# Patient Record
Sex: Male | Born: 1940 | Race: Black or African American | Hispanic: No | Marital: Single | Smoking: Never smoker
Health system: Southern US, Community
[De-identification: ages and names within clinical notes are randomized; demographics above are authoritative.]

## PROBLEM LIST (undated history)

## (undated) DIAGNOSIS — F79 Unspecified intellectual disabilities: Secondary | ICD-10-CM

## (undated) DIAGNOSIS — K219 Gastro-esophageal reflux disease without esophagitis: Secondary | ICD-10-CM

## (undated) DIAGNOSIS — D649 Anemia, unspecified: Secondary | ICD-10-CM

## (undated) DIAGNOSIS — R569 Unspecified convulsions: Secondary | ICD-10-CM

## (undated) DIAGNOSIS — R479 Unspecified speech disturbances: Secondary | ICD-10-CM

## (undated) DIAGNOSIS — I10 Essential (primary) hypertension: Secondary | ICD-10-CM

## (undated) DIAGNOSIS — N179 Acute kidney failure, unspecified: Secondary | ICD-10-CM

## (undated) DIAGNOSIS — F0390 Unspecified dementia without behavioral disturbance: Secondary | ICD-10-CM

## (undated) DIAGNOSIS — G809 Cerebral palsy, unspecified: Secondary | ICD-10-CM

---

## 2005-01-03 ENCOUNTER — Emergency Department (HOSPITAL_COMMUNITY): Admission: EM | Admit: 2005-01-03 | Discharge: 2005-01-03 | Payer: Self-pay | Admitting: Emergency Medicine

## 2005-01-10 ENCOUNTER — Ambulatory Visit (HOSPITAL_COMMUNITY): Admission: RE | Admit: 2005-01-10 | Discharge: 2005-01-10 | Payer: Self-pay | Admitting: Neurology

## 2007-04-05 ENCOUNTER — Ambulatory Visit: Payer: Self-pay | Admitting: Critical Care Medicine

## 2007-04-05 ENCOUNTER — Inpatient Hospital Stay (HOSPITAL_COMMUNITY): Admission: AC | Admit: 2007-04-05 | Discharge: 2007-05-22 | Payer: Self-pay

## 2007-04-05 ENCOUNTER — Ambulatory Visit: Payer: Self-pay | Admitting: *Deleted

## 2007-04-05 ENCOUNTER — Ambulatory Visit: Payer: Self-pay | Admitting: Internal Medicine

## 2007-04-09 ENCOUNTER — Encounter (INDEPENDENT_AMBULATORY_CARE_PROVIDER_SITE_OTHER): Payer: Self-pay | Admitting: *Deleted

## 2007-04-15 ENCOUNTER — Encounter (INDEPENDENT_AMBULATORY_CARE_PROVIDER_SITE_OTHER): Payer: Self-pay | Admitting: Internal Medicine

## 2007-04-27 ENCOUNTER — Ambulatory Visit: Payer: Self-pay | Admitting: Psychiatry

## 2007-05-14 ENCOUNTER — Ambulatory Visit: Payer: Self-pay | Admitting: Psychiatry

## 2007-06-10 ENCOUNTER — Ambulatory Visit: Payer: Self-pay | Admitting: Hospitalist

## 2007-06-10 DIAGNOSIS — F1010 Alcohol abuse, uncomplicated: Secondary | ICD-10-CM | POA: Insufficient documentation

## 2007-06-10 DIAGNOSIS — D649 Anemia, unspecified: Secondary | ICD-10-CM | POA: Insufficient documentation

## 2007-06-10 DIAGNOSIS — J390 Retropharyngeal and parapharyngeal abscess: Secondary | ICD-10-CM | POA: Insufficient documentation

## 2007-06-10 DIAGNOSIS — R569 Unspecified convulsions: Secondary | ICD-10-CM

## 2007-06-10 DIAGNOSIS — D696 Thrombocytopenia, unspecified: Secondary | ICD-10-CM | POA: Insufficient documentation

## 2007-06-10 DIAGNOSIS — F79 Unspecified intellectual disabilities: Secondary | ICD-10-CM

## 2007-06-10 DIAGNOSIS — E119 Type 2 diabetes mellitus without complications: Secondary | ICD-10-CM | POA: Insufficient documentation

## 2007-06-27 ENCOUNTER — Ambulatory Visit (HOSPITAL_COMMUNITY): Admission: RE | Admit: 2007-06-27 | Discharge: 2007-06-27 | Payer: Self-pay | Admitting: Internal Medicine

## 2007-10-19 ENCOUNTER — Encounter: Admission: RE | Admit: 2007-10-19 | Discharge: 2007-10-19 | Payer: Self-pay | Admitting: Neurology

## 2008-01-30 ENCOUNTER — Inpatient Hospital Stay (HOSPITAL_COMMUNITY): Admission: EM | Admit: 2008-01-30 | Discharge: 2008-02-18 | Payer: Self-pay | Admitting: Emergency Medicine

## 2008-01-30 ENCOUNTER — Ambulatory Visit: Payer: Self-pay | Admitting: Internal Medicine

## 2008-02-01 ENCOUNTER — Encounter (INDEPENDENT_AMBULATORY_CARE_PROVIDER_SITE_OTHER): Payer: Self-pay | Admitting: Surgery

## 2008-02-09 ENCOUNTER — Ambulatory Visit: Payer: Self-pay | Admitting: Internal Medicine

## 2008-03-05 ENCOUNTER — Ambulatory Visit (HOSPITAL_COMMUNITY): Admission: RE | Admit: 2008-03-05 | Discharge: 2008-03-05 | Payer: Self-pay | Admitting: Internal Medicine

## 2008-03-09 ENCOUNTER — Emergency Department (HOSPITAL_COMMUNITY): Admission: EM | Admit: 2008-03-09 | Discharge: 2008-03-09 | Payer: Self-pay | Admitting: Emergency Medicine

## 2008-03-31 ENCOUNTER — Emergency Department (HOSPITAL_COMMUNITY): Admission: EM | Admit: 2008-03-31 | Discharge: 2008-03-31 | Payer: Self-pay | Admitting: Emergency Medicine

## 2008-09-01 ENCOUNTER — Emergency Department (HOSPITAL_COMMUNITY): Admission: EM | Admit: 2008-09-01 | Discharge: 2008-09-01 | Payer: Self-pay | Admitting: Emergency Medicine

## 2008-09-24 ENCOUNTER — Emergency Department (HOSPITAL_BASED_OUTPATIENT_CLINIC_OR_DEPARTMENT_OTHER): Admission: EM | Admit: 2008-09-24 | Discharge: 2008-09-24 | Payer: Self-pay | Admitting: Emergency Medicine

## 2008-09-25 ENCOUNTER — Inpatient Hospital Stay (HOSPITAL_COMMUNITY): Admission: EM | Admit: 2008-09-25 | Discharge: 2008-10-09 | Payer: Self-pay | Admitting: Emergency Medicine

## 2009-08-28 IMAGING — CR DG ABD PORTABLE 1V
1 series · 1 of 1 positions shown · non-contrast
Comparison: None for comparison.

CLINICAL DATA: Femoral line placement.
 PORTABLE ABDOMEN - 1 VIEW:

[view not recorded]
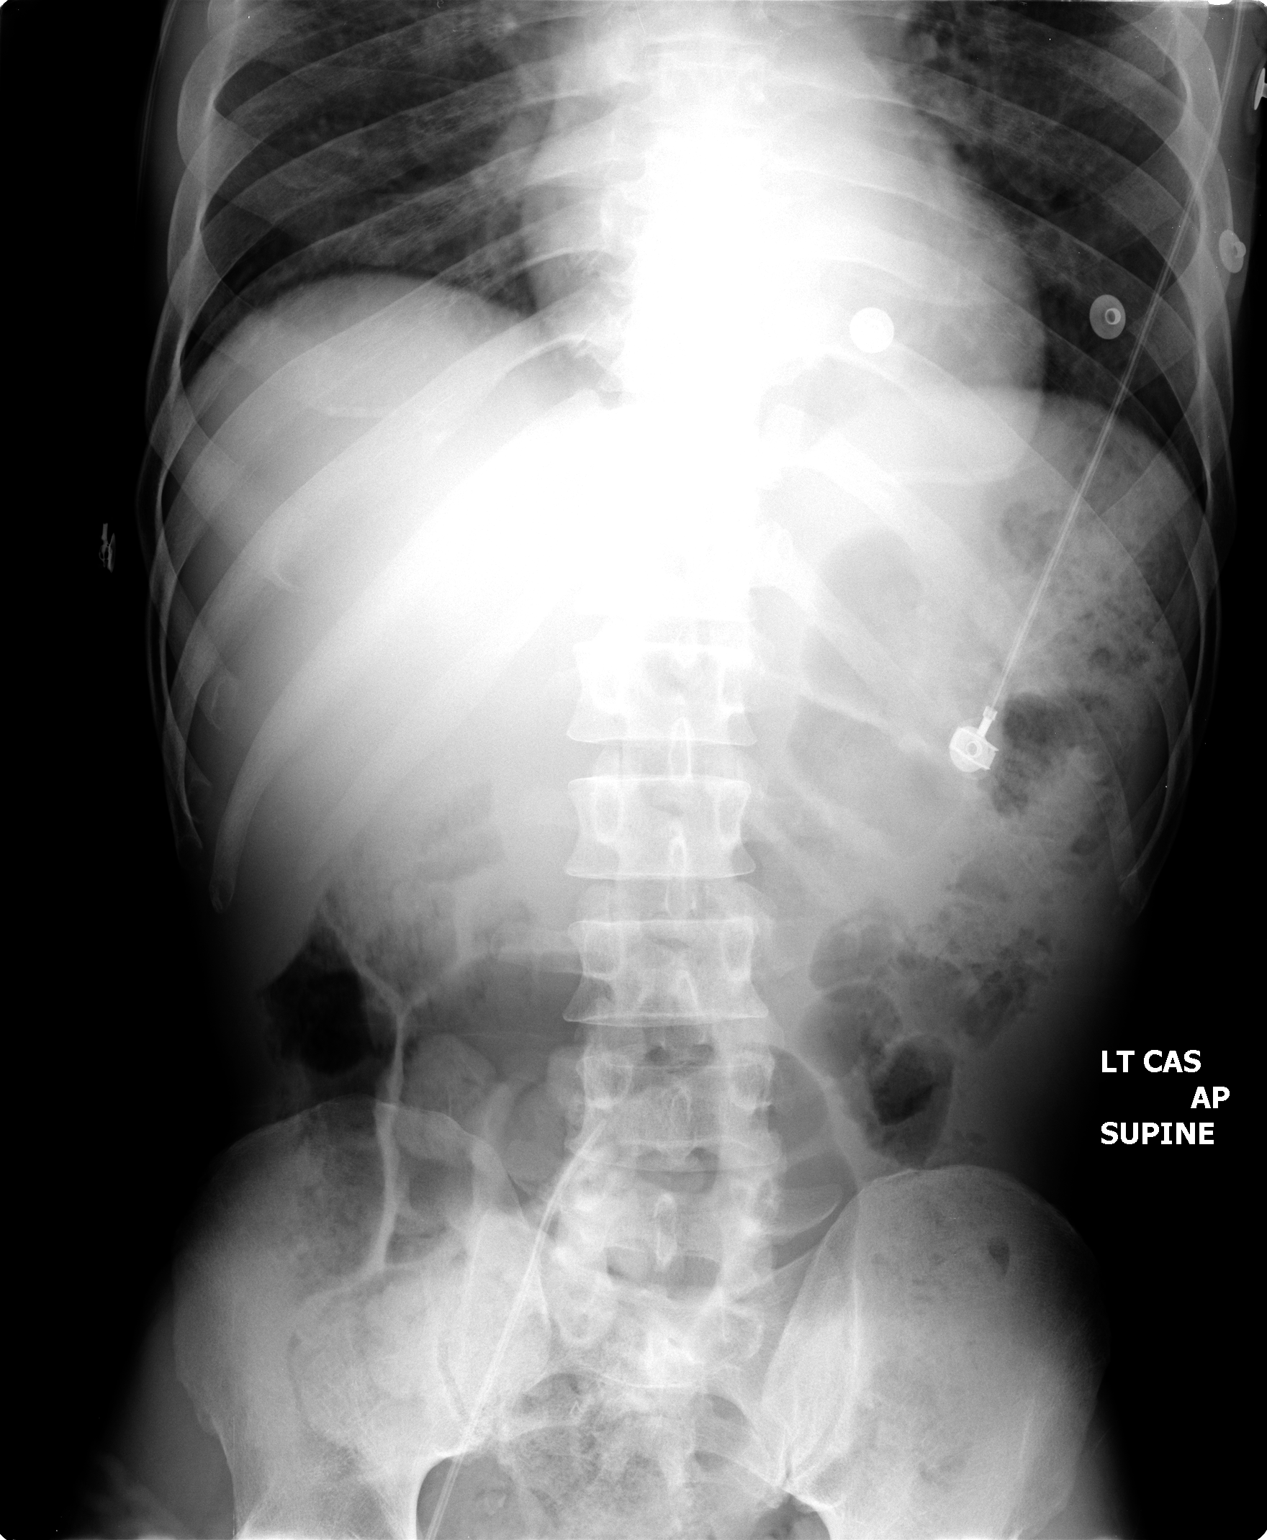

[1 of 1 positions shown; findings below may reference images not displayed]

FINDINGS: A right-sided catheter overlies the pelvis, with there distal tip projecting over the L4 vertebral body, likely near the junction of the right common iliac vein and inferior vena cava.  The tip of the catheter is to the right of midline.  
 There is a nonobstructive bowel gas pattern.
IMPRESSION: Right femoral catheter tip is to the right of midline at the level of L4.

## 2009-08-28 IMAGING — CT CT HEAD W/O CM
1 of 3 series · 15 of 30 positions shown, 19 images · IV contrast (agent unspecified)
Comparison: None.

CLINICAL DATA: Trauma. Fall. 
 HEAD CT WITHOUT CONTRAST:
TECHNIQUE: Contiguous axial images were obtained from the base of the skull through the vertex according to standard protocol without contrast.

[Series 4: head trauma 2.4 h60s · axial · 0.49mm/px · z∈[+1148,+1296]mm · 15 of 66 slices shown, 19 images]
[im 4/66  brain]
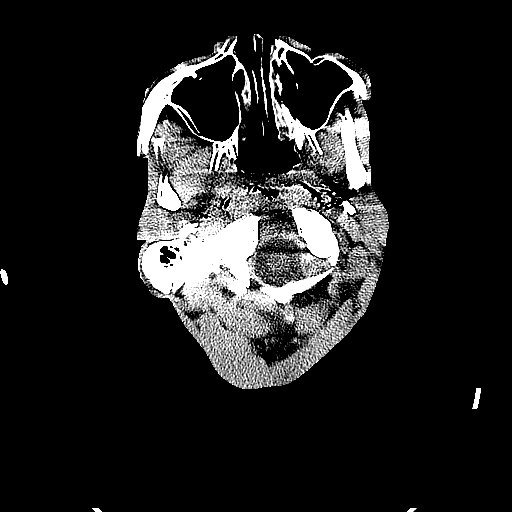
[im 4/66  bone]
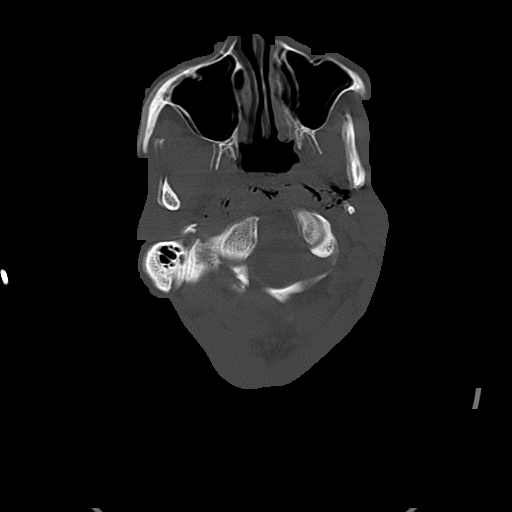
[im 8/66  brain]
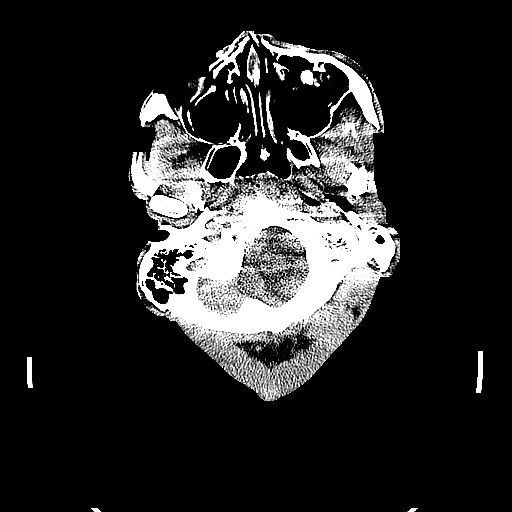
[im 12/66  brain]
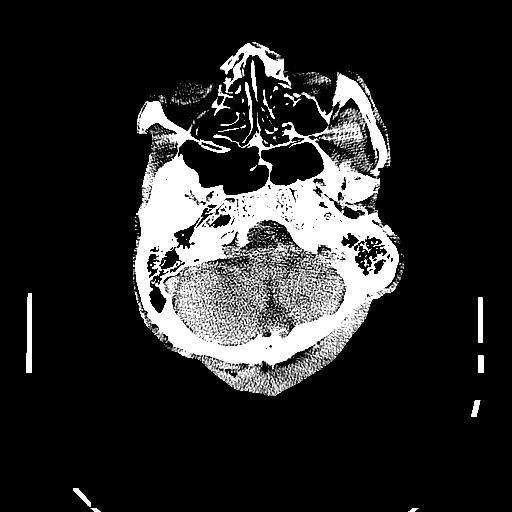
[im 16/66  brain]
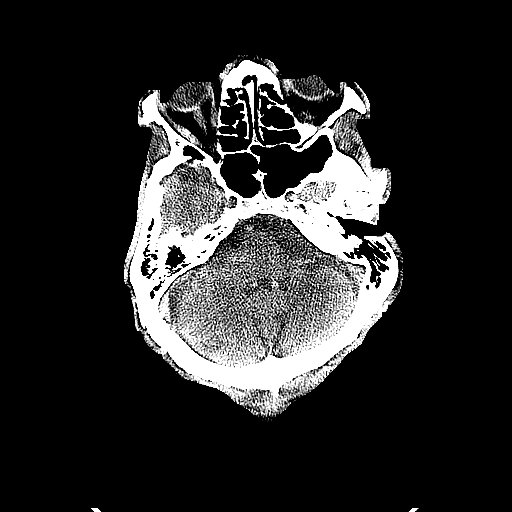
[im 20/66  brain]
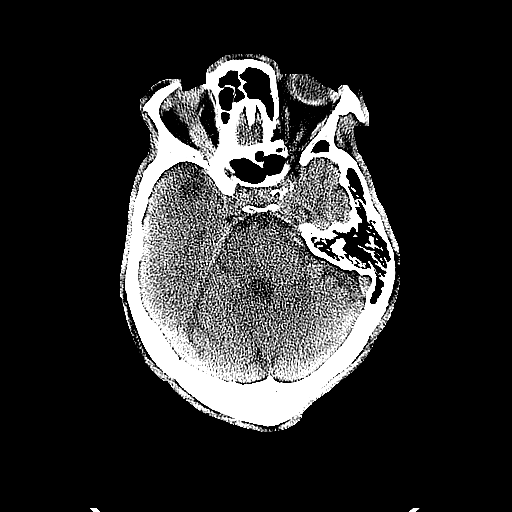
[im 20/66  bone]
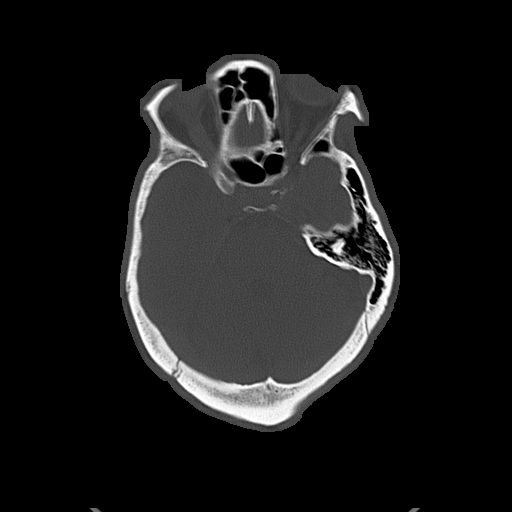
[im 23/66  brain]
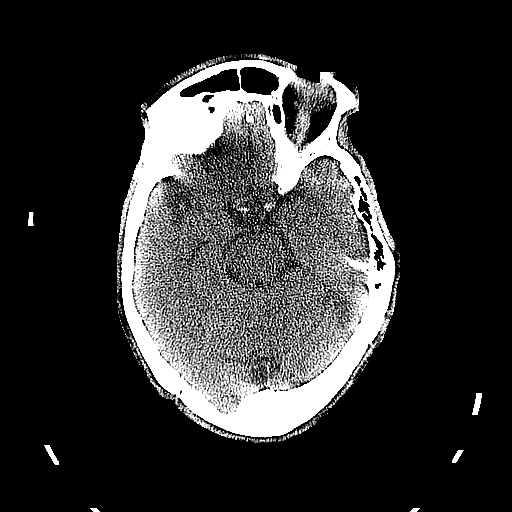
[im 27/66  brain]
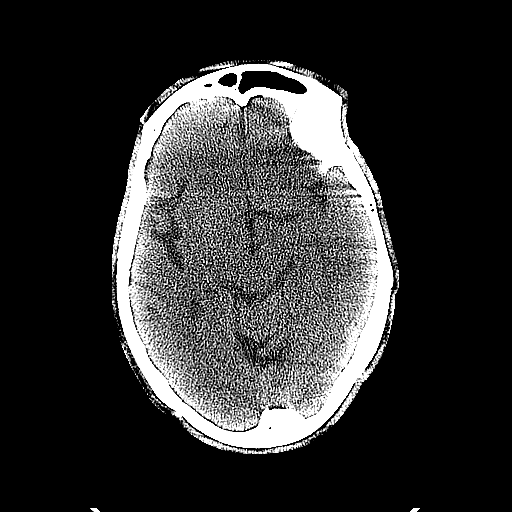
[im 35/66  brain]
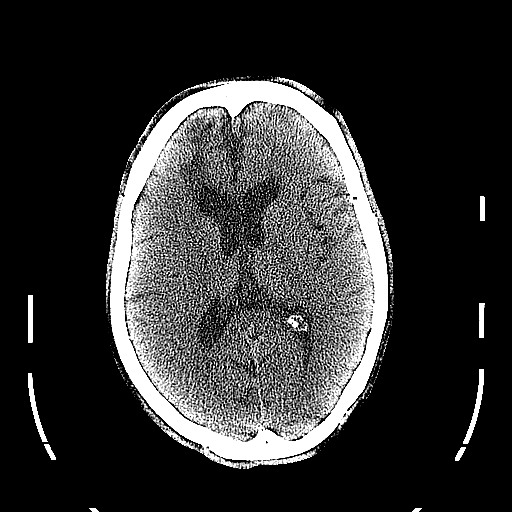
[im 39/66  brain]
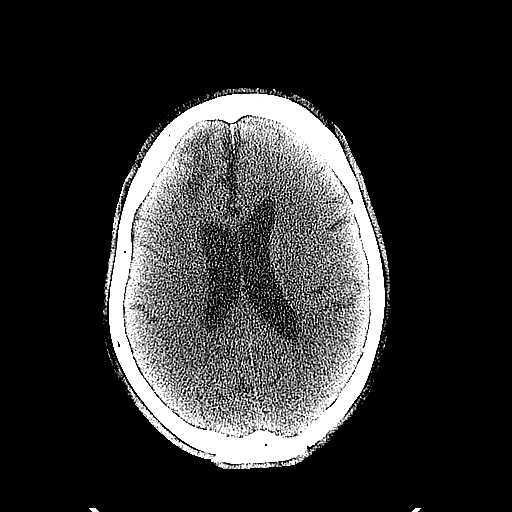
[im 39/66  bone]
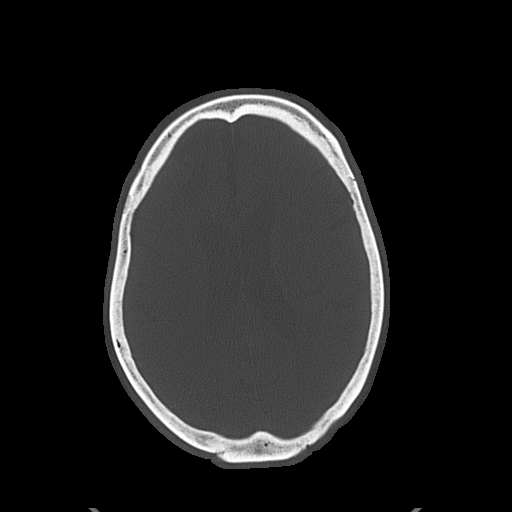
[im 43/66  brain]
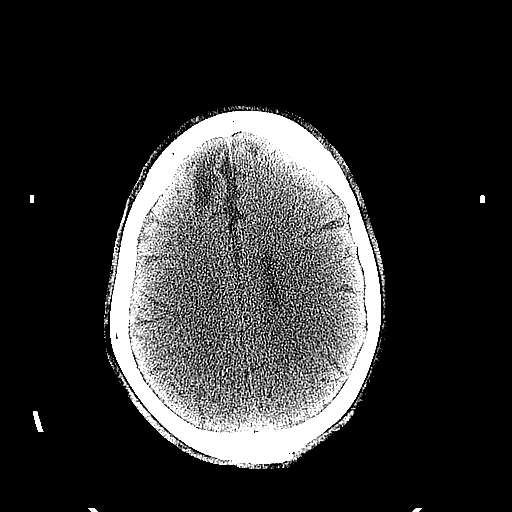
[im 46/66  brain]
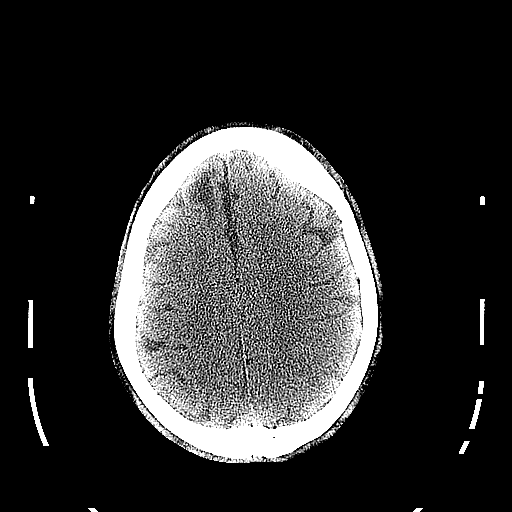
[im 50/66  brain]
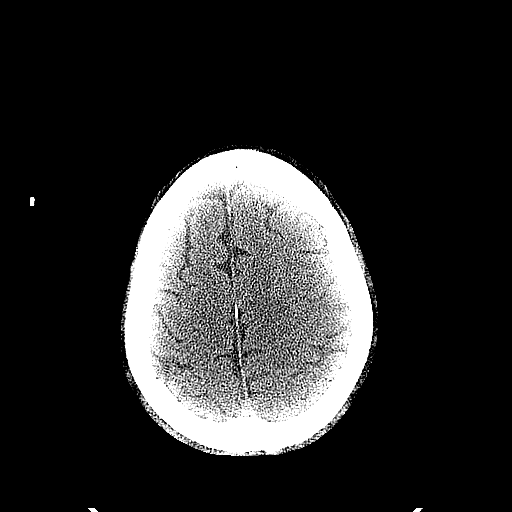
[im 54/66  brain]
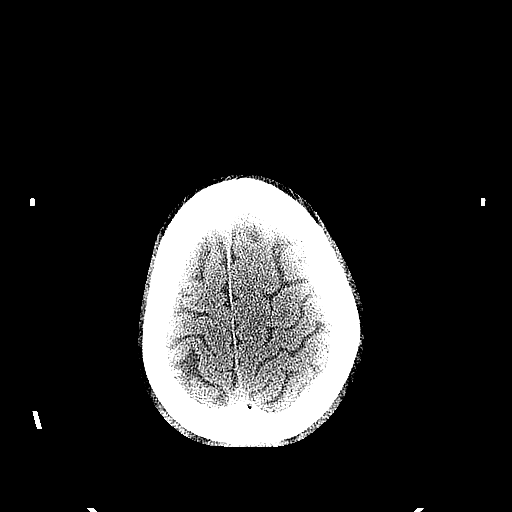
[im 54/66  bone]
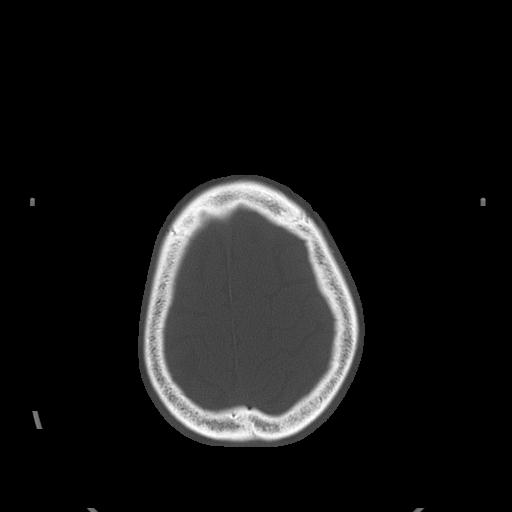
[im 58/66  brain]
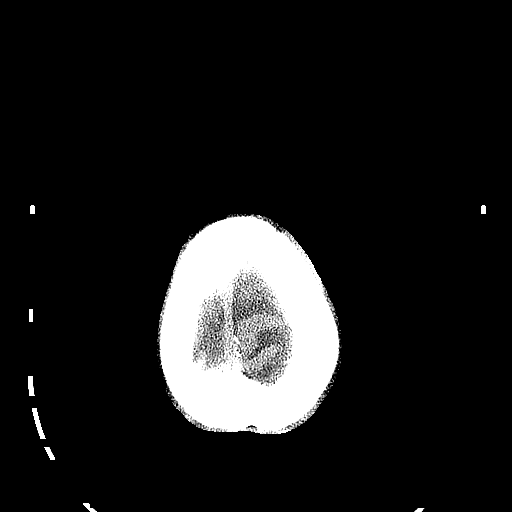
[im 62/66  brain]
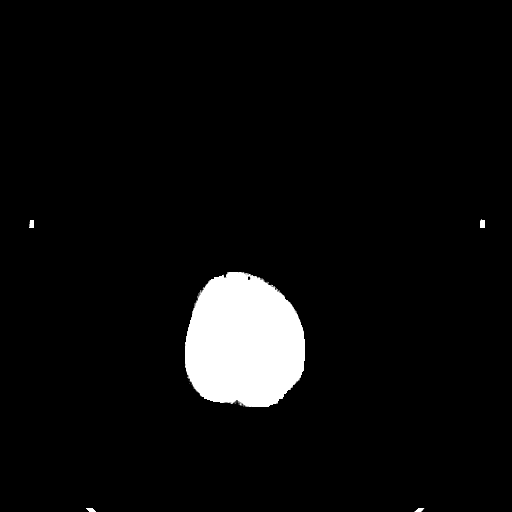

[15 of 30 positions shown; findings below may reference images not displayed]

FINDINGS: There is a chronic infarct in the right anterior cerebral artery territory.  There is a probable mass in the left frontal lobe.  This measures 2 cm and is slightly hyperdense.  It abuts the left frontal bone and may be a meningioma.  This could be better evaluated with MRI or CT with contrast.  No significant edema or mass effect is noted.  There is no midline shift. There is no hemorrhage.  No acute infarct is identified. MRI would be more sensitive to detect acute ischemia. 
 There is gas in the retropharyngeal soft tissues.  This may be due to mucosal laceration, which is not identified.  Barotrauma or attempt at tube placement could also cause this appearance.  No fracture is identified.   There is a moderately large scalp hematoma in the occipital region.
IMPRESSION: 1.  Chronic right anterior cerebral artery infarct. No acute infarct. 
 2.  There is a probable 2 cm mass in the left frontal lobe, which may represent a meningioma. Followup MRI with contrast or CT with contrast is suggested.
 3.  There is gas in the retropharyngeal tissues, which may be due to trauma.

## 2009-08-29 IMAGING — CR DG CHEST 1V PORT
1 series · 1 of 1 positions shown · non-contrast
Comparison: Earlier today.

CLINICAL DATA: Post-intubation.  Fever.  Altered mental status.  
 PORTABLE CHEST ([DATE] HOURS):

[AP]
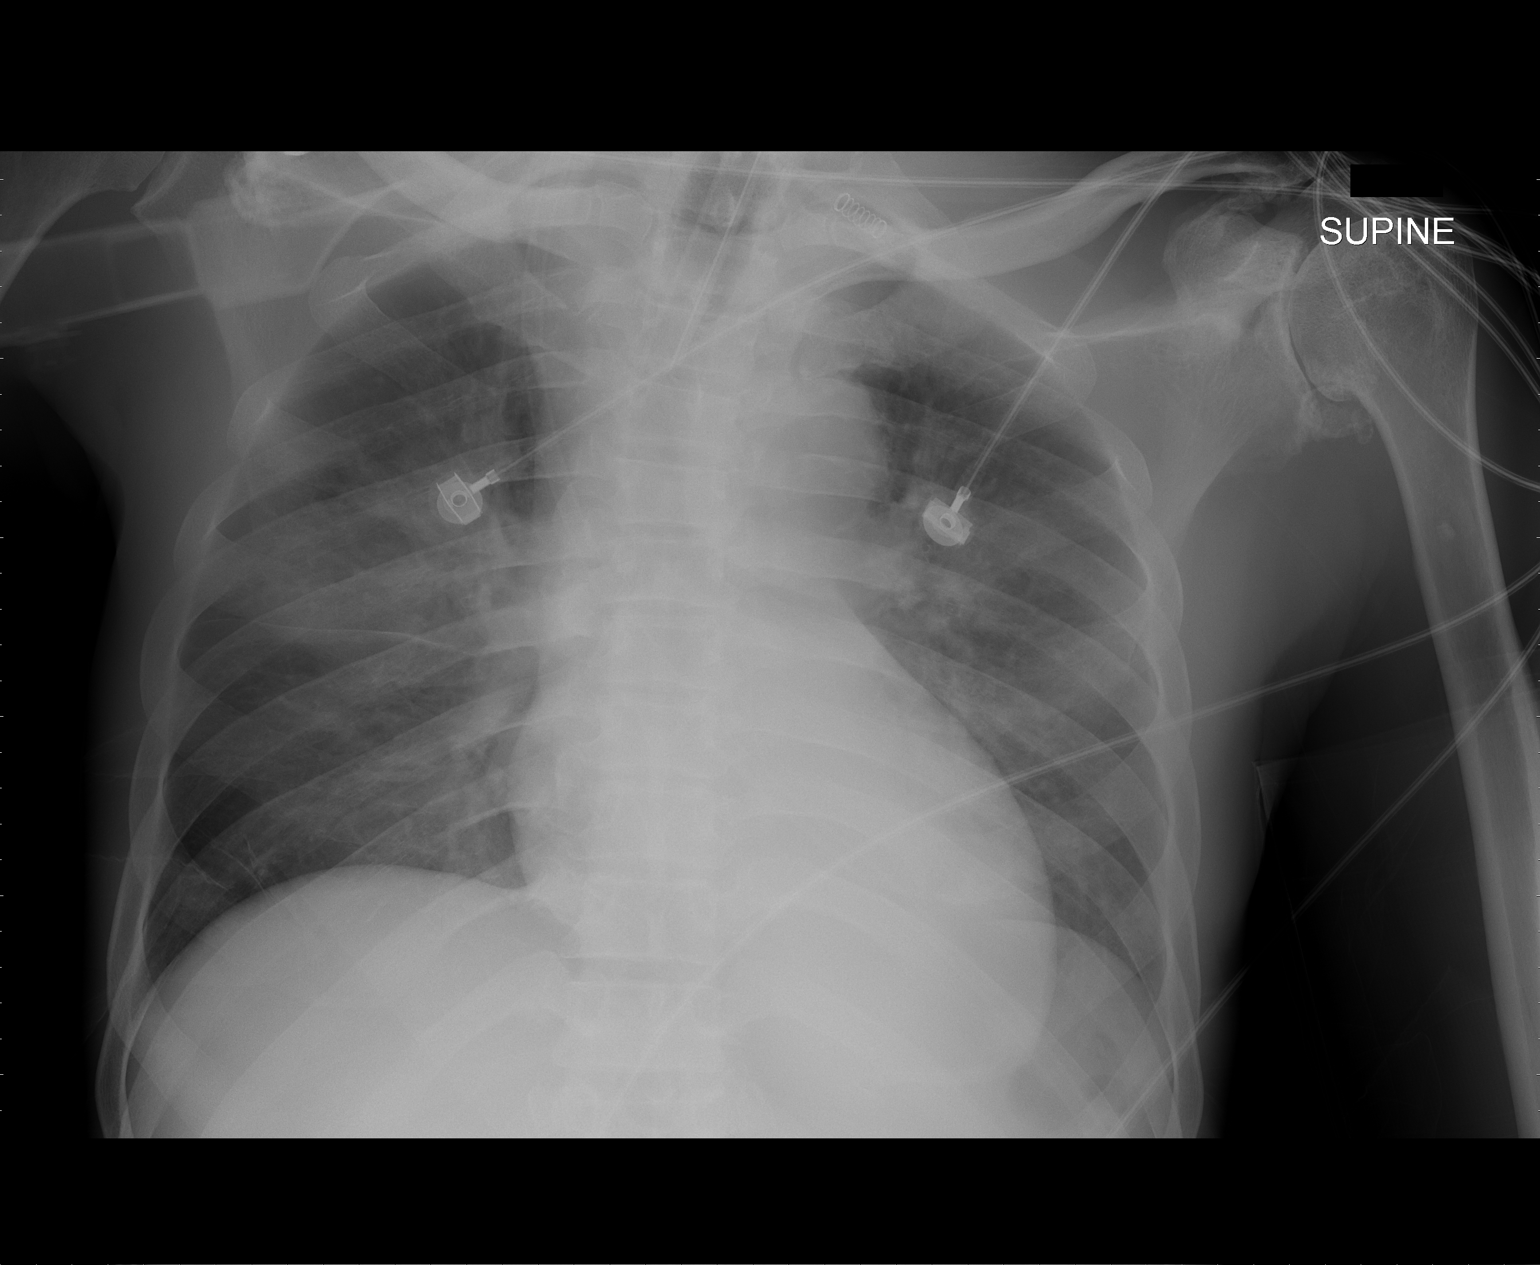

[1 of 1 positions shown; findings below may reference images not displayed]

FINDINGS: Endotracheal tube remains in good position 4.9 cm above the carina.  There is little change in primarily perihilar airspace disease right greater than left.  Heart size is stable.
IMPRESSION: Endotracheal tube tip 4.9 cm above carina.  Persistent perihilar airspace disease.

## 2009-09-08 IMAGING — CR DG CHEST 1V PORT
1 series · 1 of 1 positions shown · non-contrast
Comparison: none

CLINICAL DATA: 66-year-old male, fever.  Altered mental status.  Trach placement.    
 PORTABLE CHEST - 1 VIEW:

[AP]
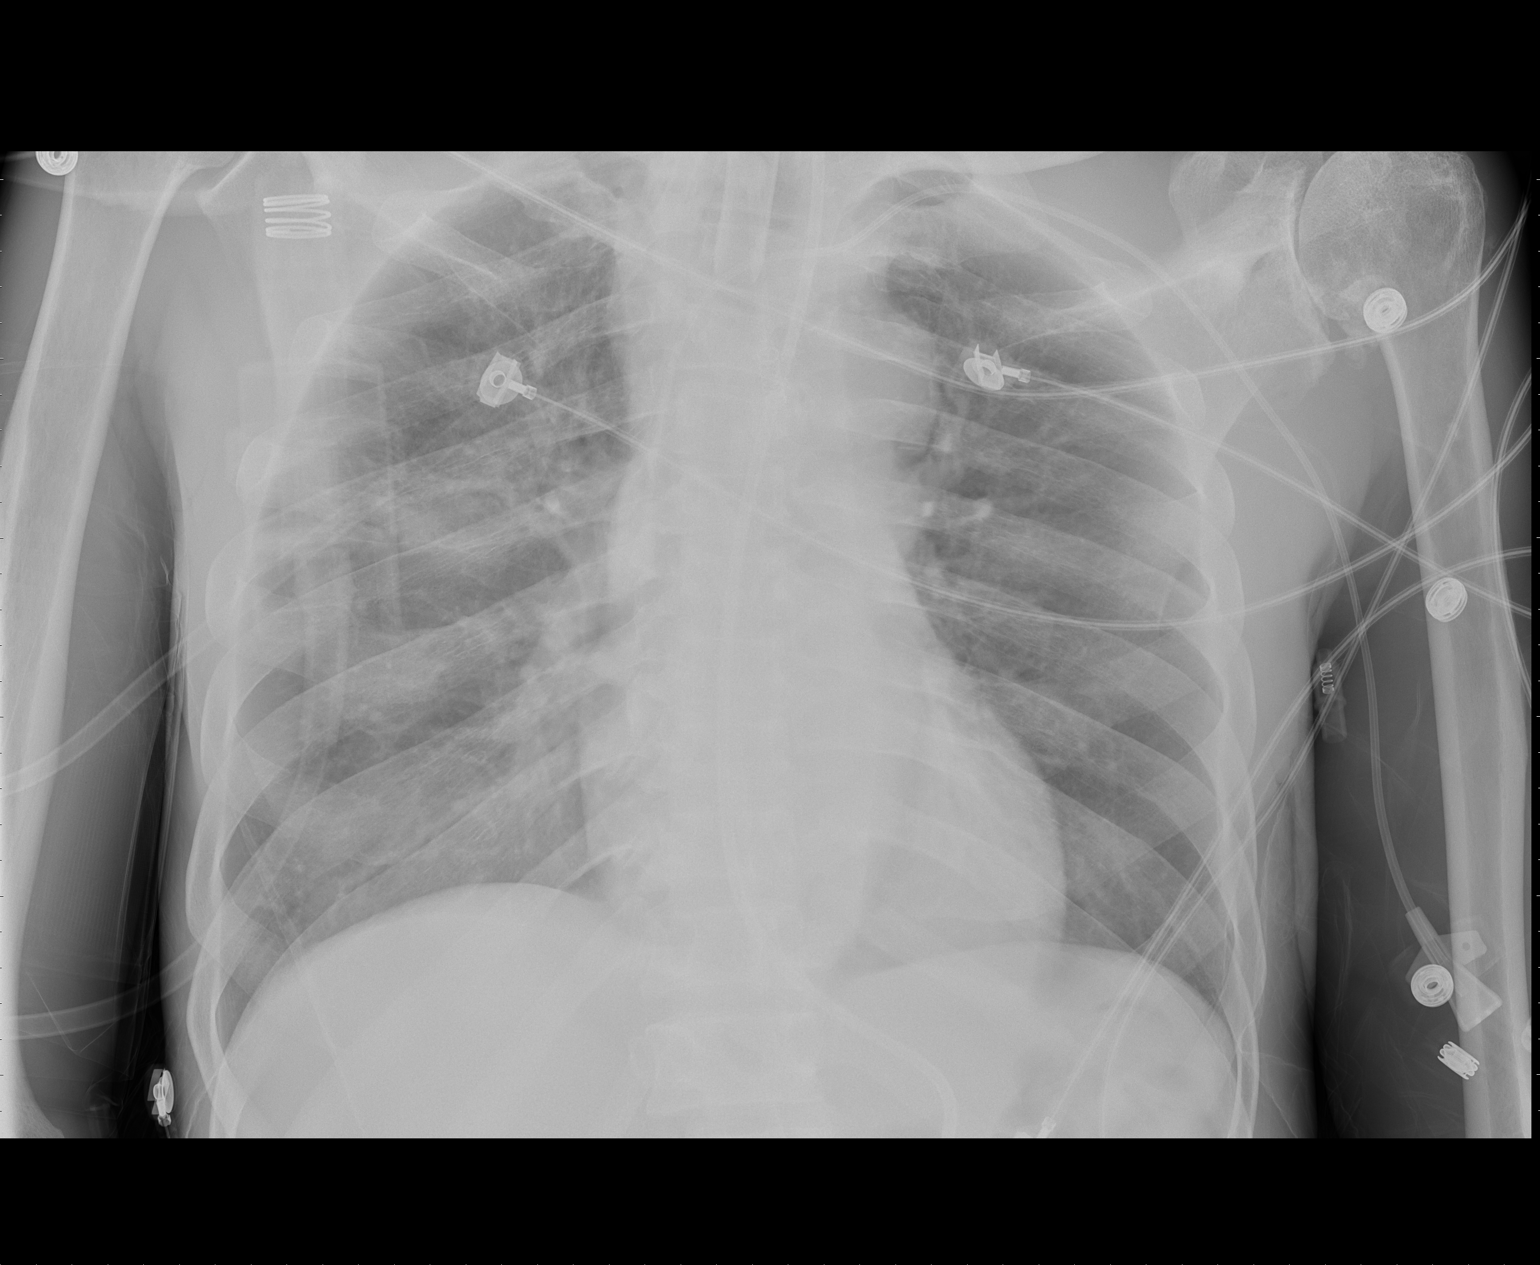

[1 of 1 positions shown; findings below may reference images not displayed]

FINDINGS: Tracheostomy tube is in place.  The tip terminates 6 cm above the carina.  Left-sided PICC line and small-bore feeding tube are in place.  The cardiopericardial silhouette is within normal limits for size.  There is no significant change in mild edema.  There is slight increase in airspace disease involving the right upper lobe.  Early infection is not excluded.
IMPRESSION: 1.   Interval placement of tracheostomy tube. 
 2.  Stable mild edema.  
 3.  Slight increase in right upper lobe airspace disease.  Question developing infection.

## 2009-09-15 IMAGING — CR DG CHEST 1V PORT
1 series · 1 of 1 positions shown · non-contrast
Comparison: none

HISTORY: Fever, altered mental status, fall

[AP]
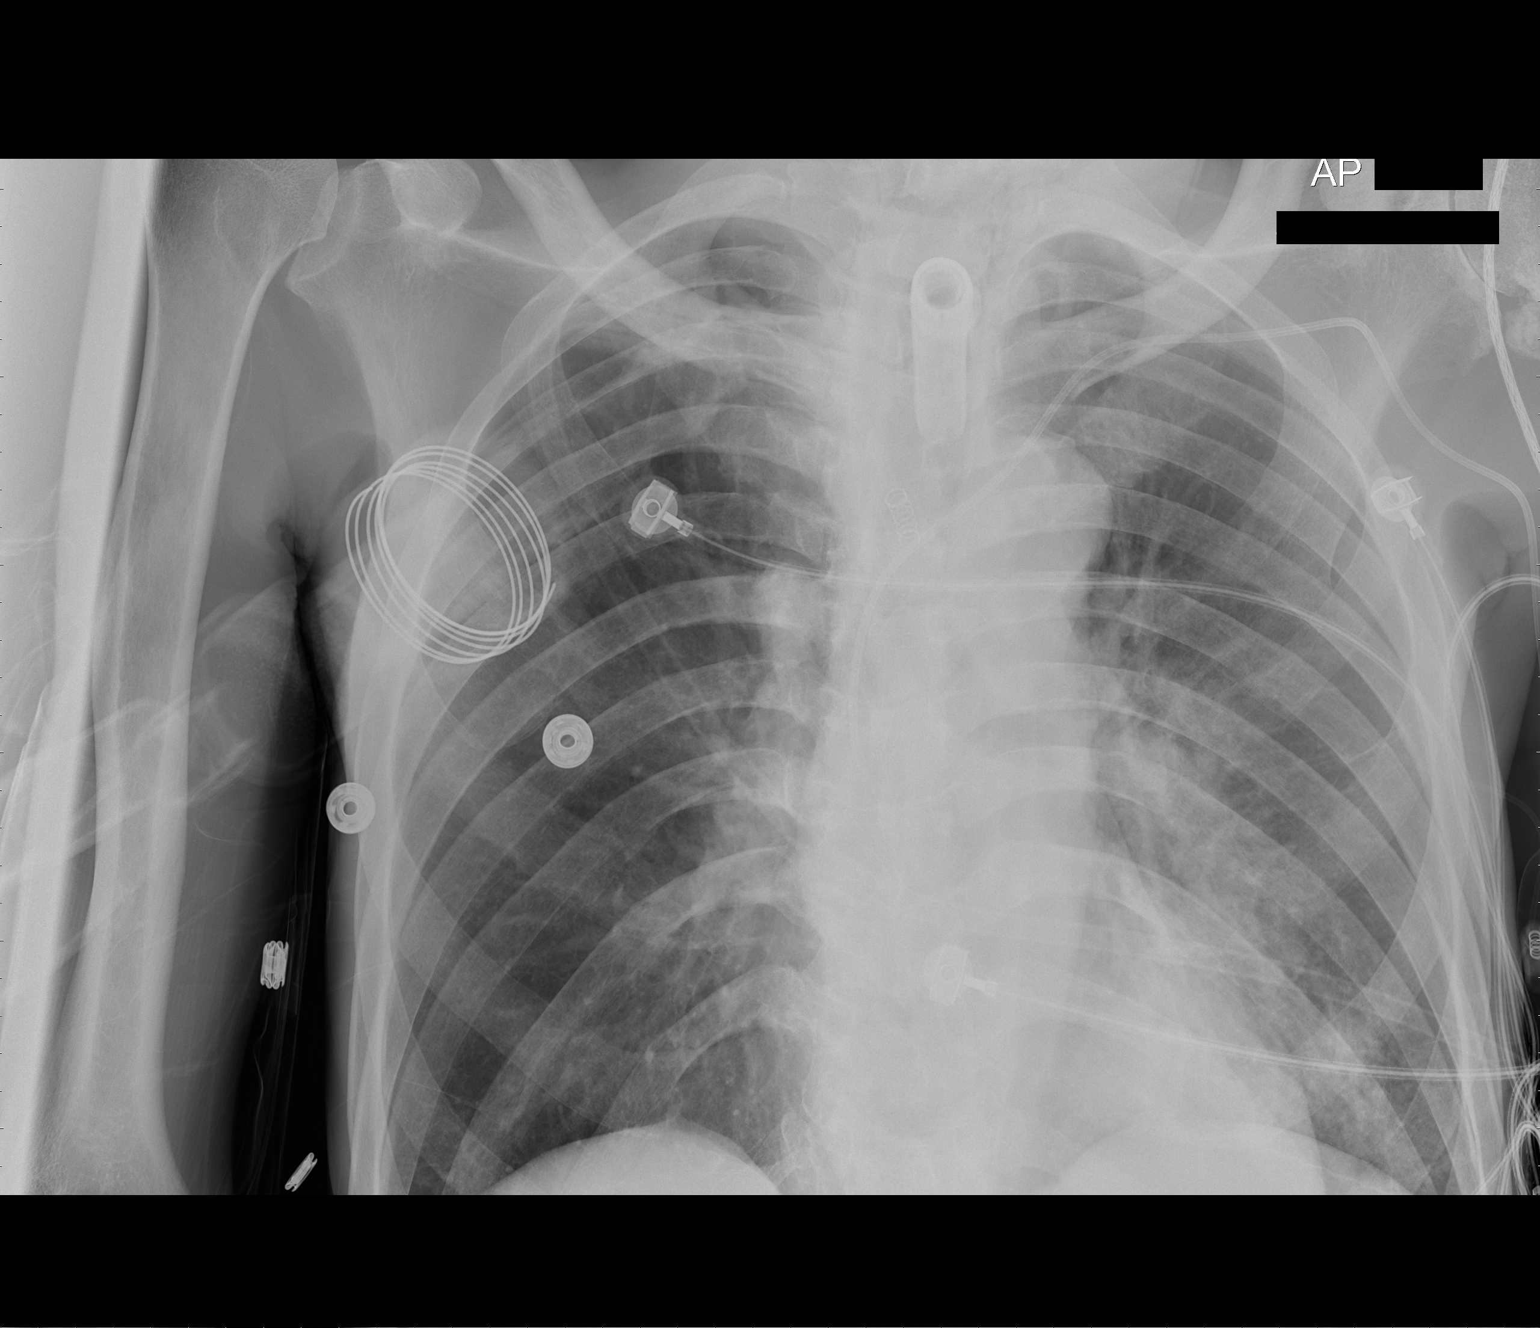

[1 of 1 positions shown; findings below may reference images not displayed]

PORTABLE CHEST ONE VIEW:

Portable exam 9032 hours compared to prior study of 04/20/2007

Tracheostomy tube and left arm PICC line stable.
Normal heart size, mediastinal contours, pulmonary vascularity.
Improved aeration right chest due to combination of improved infiltrate and
decreased effusion.
Persistent left lower lobe infiltrate.
Remaining lungs clear.
IMPRESSION: Persistent left lower lobe infiltrate.

## 2010-03-13 ENCOUNTER — Encounter: Payer: Self-pay | Admitting: *Deleted

## 2010-05-28 LAB — CBC
HCT: 28.1 % — ABNORMAL LOW (ref 39.0–52.0)
HCT: 28.5 % — ABNORMAL LOW (ref 39.0–52.0)
HCT: 29 % — ABNORMAL LOW (ref 39.0–52.0)
HCT: 34.4 % — ABNORMAL LOW (ref 39.0–52.0)
HCT: 39.9 % (ref 39.0–52.0)
Hemoglobin: 10 g/dL — ABNORMAL LOW (ref 13.0–17.0)
Hemoglobin: 10 g/dL — ABNORMAL LOW (ref 13.0–17.0)
Hemoglobin: 10.6 g/dL — ABNORMAL LOW (ref 13.0–17.0)
Hemoglobin: 11.7 g/dL — ABNORMAL LOW (ref 13.0–17.0)
Hemoglobin: 12.6 g/dL — ABNORMAL LOW (ref 13.0–17.0)
Hemoglobin: 13.6 g/dL (ref 13.0–17.0)
Hemoglobin: 13.8 g/dL (ref 13.0–17.0)
Hemoglobin: 9.7 g/dL — ABNORMAL LOW (ref 13.0–17.0)
MCHC: 33.6 g/dL (ref 30.0–36.0)
MCHC: 33.8 g/dL (ref 30.0–36.0)
MCHC: 33.7 g/dL (ref 30.0–36.0)
MCHC: 33.8 g/dL (ref 30.0–36.0)
MCHC: 33.9 g/dL (ref 30.0–36.0)
MCV: 94.8 fL (ref 78.0–100.0)
MCV: 92.8 fL (ref 78.0–100.0)
MCV: 93.6 fL (ref 78.0–100.0)
MCV: 94.1 fL (ref 78.0–100.0)
Platelets: 402 10*3/uL — ABNORMAL HIGH (ref 150–400)
Platelets: 476 10*3/uL — ABNORMAL HIGH (ref 150–400)
Platelets: 222 10*3/uL (ref 150–400)
RBC: 3.16 MIL/uL — ABNORMAL LOW (ref 4.22–5.81)
RBC: 3.13 MIL/uL — ABNORMAL LOW (ref 4.22–5.81)
RBC: 3.33 MIL/uL — ABNORMAL LOW (ref 4.22–5.81)
RBC: 3.94 MIL/uL — ABNORMAL LOW (ref 4.22–5.81)
RBC: 4.27 MIL/uL (ref 4.22–5.81)
RBC: 4.32 MIL/uL (ref 4.22–5.81)
RDW: 13.3 % (ref 11.5–15.5)
RDW: 12.3 % (ref 11.5–15.5)
RDW: 12.4 % (ref 11.5–15.5)
WBC: 16.7 10*3/uL — ABNORMAL HIGH (ref 4.0–10.5)
WBC: 12.2 10*3/uL — ABNORMAL HIGH (ref 4.0–10.5)
WBC: 13.8 10*3/uL — ABNORMAL HIGH (ref 4.0–10.5)
WBC: 17.4 10*3/uL — ABNORMAL HIGH (ref 4.0–10.5)
WBC: 20.4 10*3/uL — ABNORMAL HIGH (ref 4.0–10.5)
WBC: 23.7 10*3/uL — ABNORMAL HIGH (ref 4.0–10.5)

## 2010-05-28 LAB — BASIC METABOLIC PANEL
BUN: 2 mg/dL — ABNORMAL LOW (ref 6–23)
CO2: 27 mEq/L (ref 19–32)
CO2: 27 mEq/L (ref 19–32)
CO2: 28 mEq/L (ref 19–32)
CO2: 28 mEq/L (ref 19–32)
Calcium: 8 mg/dL — ABNORMAL LOW (ref 8.4–10.5)
Calcium: 8.6 mg/dL (ref 8.4–10.5)
Calcium: 9.2 mg/dL (ref 8.4–10.5)
Chloride: 103 mEq/L (ref 96–112)
Chloride: 105 mEq/L (ref 96–112)
Chloride: 106 mEq/L (ref 96–112)
Creatinine, Ser: 0.76 mg/dL (ref 0.4–1.5)
Creatinine, Ser: 0.8 mg/dL (ref 0.4–1.5)
Creatinine, Ser: 0.93 mg/dL (ref 0.4–1.5)
GFR calc Af Amer: >60 mL/min (ref >60)
GFR calc Af Amer: >60 mL/min (ref >60)
GFR calc Af Amer: >60 mL/min (ref >60)
GFR calc Af Amer: >60 mL/min (ref >60)
GFR calc Af Amer: >60 mL/min (ref >60)
GFR calc non Af Amer: >60 mL/min (ref >60)
GFR calc non Af Amer: >60 mL/min (ref >60)
Glucose, Bld: 89 mg/dL (ref 70–99)
Glucose, Bld: 195 mg/dL — ABNORMAL HIGH (ref 70–99)
Potassium: 3.5 mEq/L (ref 3.5–5.1)
Sodium: 141 mEq/L (ref 135–145)
Sodium: 139 mEq/L (ref 135–145)
Sodium: 139 mEq/L (ref 135–145)

## 2010-05-28 LAB — GLUCOSE, CAPILLARY
Glucose-Capillary: 100 mg/dL — ABNORMAL HIGH (ref 70–99)
Glucose-Capillary: 101 mg/dL — ABNORMAL HIGH (ref 70–99)
Glucose-Capillary: 106 mg/dL — ABNORMAL HIGH (ref 70–99)
Glucose-Capillary: 109 mg/dL — ABNORMAL HIGH (ref 70–99)
Glucose-Capillary: 115 mg/dL — ABNORMAL HIGH (ref 70–99)
Glucose-Capillary: 122 mg/dL — ABNORMAL HIGH (ref 70–99)
Glucose-Capillary: 106 mg/dL — ABNORMAL HIGH (ref 70–99)
Glucose-Capillary: 117 mg/dL — ABNORMAL HIGH (ref 70–99)
Glucose-Capillary: 118 mg/dL — ABNORMAL HIGH (ref 70–99)
Glucose-Capillary: 122 mg/dL — ABNORMAL HIGH (ref 70–99)
Glucose-Capillary: 127 mg/dL — ABNORMAL HIGH (ref 70–99)
Glucose-Capillary: 128 mg/dL — ABNORMAL HIGH (ref 70–99)
Glucose-Capillary: 135 mg/dL — ABNORMAL HIGH (ref 70–99)
Glucose-Capillary: 138 mg/dL — ABNORMAL HIGH (ref 70–99)
Glucose-Capillary: 138 mg/dL — ABNORMAL HIGH (ref 70–99)
Glucose-Capillary: 139 mg/dL — ABNORMAL HIGH (ref 70–99)
Glucose-Capillary: 140 mg/dL — ABNORMAL HIGH (ref 70–99)
Glucose-Capillary: 140 mg/dL — ABNORMAL HIGH (ref 70–99)
Glucose-Capillary: 145 mg/dL — ABNORMAL HIGH (ref 70–99)
Glucose-Capillary: 148 mg/dL — ABNORMAL HIGH (ref 70–99)
Glucose-Capillary: 173 mg/dL — ABNORMAL HIGH (ref 70–99)
Glucose-Capillary: 218 mg/dL — ABNORMAL HIGH (ref 70–99)

## 2010-05-28 LAB — DIFFERENTIAL
Basophils Absolute: 0.1 10*3/uL (ref 0.0–0.1)
Basophils Relative: 0 % (ref 0–1)
Basophils Relative: 0 % (ref 0–1)
Basophils Relative: 0 % (ref 0–1)
Eosinophils Absolute: 0 10*3/uL (ref 0.0–0.7)
Eosinophils Relative: 0 % (ref 0–5)
Lymphocytes Relative: 5 % — ABNORMAL LOW (ref 12–46)
Lymphs Abs: 0.4 10*3/uL — ABNORMAL LOW (ref 0.7–4.0)
Lymphs Abs: 1 10*3/uL (ref 0.7–4.0)
Monocytes Absolute: 0.5 10*3/uL (ref 0.1–1.0)
Monocytes Absolute: 1.7 10*3/uL — ABNORMAL HIGH (ref 0.1–1.0)
Monocytes Relative: 4 % (ref 3–12)
Monocytes Relative: 8 % (ref 3–12)
Neutro Abs: 11.3 10*3/uL — ABNORMAL HIGH (ref 1.7–7.7)
Neutro Abs: 17.6 10*3/uL — ABNORMAL HIGH (ref 1.7–7.7)
Neutrophils Relative %: 87 % — ABNORMAL HIGH (ref 43–77)

## 2010-05-28 LAB — URINALYSIS, MICROSCOPIC ONLY
Bilirubin Urine: NEGATIVE
Glucose, UA: NEGATIVE mg/dL
Nitrite: NEGATIVE
Protein, ur: 30 mg/dL — AB
Specific Gravity, Urine: 1.019 (ref 1.005–1.030)
Urobilinogen, UA: 0.2 mg/dL (ref 0.0–1.0)
Urobilinogen, UA: 2 mg/dL — ABNORMAL HIGH (ref 0.0–1.0)

## 2010-05-28 LAB — RENAL FUNCTION PANEL
BUN: 7 mg/dL (ref 6–23)
CO2: 26 mEq/L (ref 19–32)
Chloride: 112 mEq/L (ref 96–112)
Creatinine, Ser: 0.88 mg/dL (ref 0.4–1.5)
Glucose, Bld: 142 mg/dL — ABNORMAL HIGH (ref 70–99)

## 2010-05-28 LAB — COMPREHENSIVE METABOLIC PANEL
ALT: 12 U/L (ref 0–53)
ALT: 13 U/L (ref 0–53)
AST: 15 U/L (ref 0–37)
AST: 17 U/L (ref 0–37)
Albumin: 2.2 g/dL — ABNORMAL LOW (ref 3.5–5.2)
Alkaline Phosphatase: 55 U/L (ref 39–117)
Alkaline Phosphatase: 64 U/L (ref 39–117)
Alkaline Phosphatase: 66 U/L (ref 39–117)
Alkaline Phosphatase: 69 U/L (ref 39–117)
BUN: 5 mg/dL — ABNORMAL LOW (ref 6–23)
BUN: 7 mg/dL (ref 6–23)
CO2: 25 mEq/L (ref 19–32)
CO2: 26 mEq/L (ref 19–32)
CO2: 26 mEq/L (ref 19–32)
Calcium: 8.1 mg/dL — ABNORMAL LOW (ref 8.4–10.5)
Calcium: 8.8 mg/dL (ref 8.4–10.5)
Chloride: 105 mEq/L (ref 96–112)
Chloride: 111 mEq/L (ref 96–112)
Creatinine, Ser: 0.75 mg/dL (ref 0.4–1.5)
GFR calc Af Amer: >60 mL/min (ref >60)
GFR calc Af Amer: >60 mL/min (ref >60)
GFR calc non Af Amer: >60 mL/min (ref >60)
GFR calc non Af Amer: >60 mL/min (ref >60)
Glucose, Bld: 120 mg/dL — ABNORMAL HIGH (ref 70–99)
Glucose, Bld: 126 mg/dL — ABNORMAL HIGH (ref 70–99)
Glucose, Bld: 126 mg/dL — ABNORMAL HIGH (ref 70–99)
Potassium: 3.3 mEq/L — ABNORMAL LOW (ref 3.5–5.1)
Potassium: 3.3 mEq/L — ABNORMAL LOW (ref 3.5–5.1)
Potassium: 3.4 mEq/L — ABNORMAL LOW (ref 3.5–5.1)
Potassium: 3.6 mEq/L (ref 3.5–5.1)
Sodium: 140 mEq/L (ref 135–145)
Sodium: 144 mEq/L (ref 135–145)
Total Bilirubin: 0.5 mg/dL (ref 0.3–1.2)
Total Bilirubin: 1 mg/dL (ref 0.3–1.2)
Total Protein: 5.8 g/dL — ABNORMAL LOW (ref 6.0–8.3)
Total Protein: 6.5 g/dL (ref 6.0–8.3)

## 2010-05-28 LAB — URINE CULTURE
Colony Count: NO GROWTH
Colony Count: NO GROWTH
Culture: NO GROWTH
Culture: NO GROWTH

## 2010-05-28 LAB — CULTURE, BLOOD (ROUTINE X 2): Culture: NO GROWTH

## 2010-05-28 LAB — URINALYSIS, ROUTINE W REFLEX MICROSCOPIC
Glucose, UA: NEGATIVE mg/dL
Ketones, ur: NEGATIVE mg/dL
Leukocytes, UA: NEGATIVE
Nitrite: NEGATIVE
Protein, ur: 100 mg/dL — AB
Urobilinogen, UA: 1 mg/dL (ref 0.0–1.0)

## 2010-05-28 LAB — CLOSTRIDIUM DIFFICILE EIA

## 2010-05-28 LAB — PHENYTOIN LEVEL, TOTAL
Phenytoin Lvl: 2.7 ug/mL — ABNORMAL LOW (ref 10.0–20.0)
Phenytoin Lvl: 15.2 ug/mL (ref 10.0–20.0)
Phenytoin Lvl: 19.6 ug/mL (ref 10.0–20.0)

## 2010-05-28 LAB — URINE MICROSCOPIC-ADD ON

## 2010-05-28 LAB — T4, FREE: Free T4: 0.75 ng/dL — ABNORMAL LOW (ref 0.80–1.80)

## 2010-05-28 LAB — BRAIN NATRIURETIC PEPTIDE
Pro B Natriuretic peptide (BNP): 331 pg/mL — ABNORMAL HIGH (ref 0.0–100.0)
Pro B Natriuretic peptide (BNP): 61 pg/mL (ref 0.0–100.0)

## 2010-06-07 LAB — DIFFERENTIAL
Basophils Absolute: 0 10*3/uL (ref 0.0–0.1)
Basophils Relative: 0 % (ref 0–1)
Eosinophils Absolute: 0 10*3/uL (ref 0.0–0.7)
Lymphs Abs: 1.2 10*3/uL (ref 0.7–4.0)
Neutro Abs: 3.6 10*3/uL (ref 1.7–7.7)
Neutrophils Relative %: 70 % (ref 43–77)

## 2010-06-07 LAB — COMPREHENSIVE METABOLIC PANEL
ALT: 15 U/L (ref 0–53)
BUN: 9 mg/dL (ref 6–23)
CO2: 25 mEq/L (ref 19–32)
Calcium: 9.4 mg/dL (ref 8.4–10.5)
Creatinine, Ser: 0.82 mg/dL (ref 0.4–1.5)
GFR calc non Af Amer: >60 mL/min (ref >60)
Glucose, Bld: 106 mg/dL — ABNORMAL HIGH (ref 70–99)
Sodium: 138 mEq/L (ref 135–145)

## 2010-06-07 LAB — CBC
HCT: 40.7 % (ref 39.0–52.0)
Hemoglobin: 13.7 g/dL (ref 13.0–17.0)
MCHC: 33.7 g/dL (ref 30.0–36.0)
MCV: 90.4 fL (ref 78.0–100.0)
RBC: 4.51 MIL/uL (ref 4.22–5.81)

## 2010-07-05 NOTE — Discharge Summary (Signed)
NAME:  Wesley Sullivan, PELFREY NO.:  [id number]   MEDICAL RECORD NO.:  [id number]          PATIENT TYPE:  INP   LOCATION:  3004                         FACILITY:  [facility]   PHYSICIAN:  Fleet Contras, M.D.    DATE OF BIRTH:  May 17, 1940   DATE OF ADMISSION:  09/25/2008  DATE OF DISCHARGE:  10/09/2008                               DISCHARGE SUMMARY   HISTORY OF PRESENT ILLNESS:  Mr. Haymer is a 70 year old African  American gentleman with past medical history significant for cerebral  palsy with mental retardation, type 2 diabetes mellitus, dementia,  seizure disorder, gastroesophageal reflux disease, acute renal failure,  and chronic constipation and speech impairment.  He was admitted via the  emergency room at [facility] with episodes of multiple seizure  activity at home reported by his family.  That were said to be tonic-  clonic grand mal seizures lasting for several minutes.  At the emergency  room, he had 1 episode which was witnessed and his vital signs were  stable with blood pressure 141/85, heart rate of 88, respiratory rate of  90, temperature 98.6.  CT scan of his head was reported as showing  chronic right frontal encephalomalacia without any acute process.  His  initial laboratory data showed a white count of 12.2, hemoglobin 12.6,  hematocrit 37, platelet count of 154.  Fingerstick glucose was 149.  His  BUN was 11, creatinine 0.83.  Tegretol level was 2.0.  Urinalysis was  negative for urinary tract infection and lung x-ray shows low lung  volumes with no acute infiltrates.  He was, therefore, admitted to the  hospital for stabilization of his seizures and optimization of his  medications.   HOSPITAL COURSE:  On admission, the patient was started on intravenous  Dilantin loading and was continued on 100 mg intravenously q.8 h.  He  had no further seizures.  Neurology consult was requested, and the  patient was kindly seen by Dr. Roseanne Reno who  recommended continuing  Dilantin and also starting Keppra 1000 mg loading dose followed by a  1000 mg q.12 h.  MRI of the brain was negative for acute changes.  X-ray  of the abdomen as well as CT scan of the abdomen showed ileus.  On  September 28, 2008, he began to have some low-grade fevers and a chest x-ray  showed bilateral pneumonia, and the patient was started on intravenous  Zosyn, which he has now completed for 10 days.  His CBGs were monitored  before meals and at bedtime and covered with sliding scale NovoLog  insulin.  He was thought to have had possible aspiration pneumonia and  was kept n.p.o., and he was evaluated by speech therapy with modified  barium swallow.  He was found to have silent aspiration with  oropharyngeal dysphagia.  During the MBS swallow, he was found to have a  large cervical osteophyte with pressure effect on the oropharynx and  nasopharynx.  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  This finding was  discussed with his family and he was agreed that he was not a candidate  for any surgical procedure, and therefore, will be slowly advanced on  his diet on pureed diet recommended by the speech therapist.  He has now  completed 10 days of intravenous Zosyn.  He has remained afebrile, and  his white count has come down.  His seizures have been controlled on the  current regimen.  He has been evaluated by physical therapy and  occupational therapy and thought to be back to his baseline.  Today, he  is having no new complaints.   PHYSICAL EXAMINATION:  VITAL SIGNS:  He is afebrile.  His vital signs  are stable.  RESPIRATORY:  His chest is clear to auscultation.  Heart:  S1 and S2 are heard.  ABDOMEN:  Soft, nontender.  No masses.  Bowel sounds are present.  EXTREMITIES:  No edema, calf tenderness, or swelling.  CNS:  He is awake  and responsive, but he is nonverbal.  There are no  focal neurological deficits.  He is now considered stable for discharge  home today.   DISCHARGE DIAGNOSES:  1. Seizure disorder, now controlled.  2. Bilateral pneumonia, likely aspiration, completed 10 days of      intravenous Zosyn, remaining afebrile with no leukocytosis.  3. Clostridium difficile positive in stool.  He has completed 5 days      of metronidazole, and he will complete another 10 days at home.  4. Dysphagia.  This may be related to cervical spondylosis with      osteophytes, but he is not a surgical candidate at this time.  5. Deconditioning with dementia and mental retardation, currently at      his baseline.   His discharge plan has been discussed with family and they want to take  him back home.  His condition on discharge is stable.  His disposition  is for home.   DISCHARGE MEDICATIONS:  1. Duragesic patch 25 mcg every 72 hours.  2. Protonix 40 mg daily.  3. Ambien 5 mg nightly p.r.n. for sleep.  4. Albuterol nebulizer 2.5 mg q.6 h. P.r.n.  5. Keppra 1000 mg b.i.d.  6. Dilantin 100 mg p.o. t.i.d.  7. Flagyl 500 mg p.o. t.i.d. for 10 days.   He will follow up with me in the office in about 2 weeks.      Fleet Contras, M.D.  Electronically Signed     EA/MEDQ  D:  10/09/2008  T:  10/09/2008  Job:  [id number]

## 2010-07-05 NOTE — H&P (Signed)
NAME:  ANGELA, PLATNER NO.:  [id number]   MEDICAL RECORD NO.:  [id number]          PATIENT TYPE:  INP   LOCATION:  0104                         FACILITY:  [facility]   PHYSICIAN:  Marcellus Scott, MD     DATE OF BIRTH:  1940/10/18   DATE OF ADMISSION:  01/30/2008  DATE OF DISCHARGE:                              HISTORY & PHYSICAL   PRIMARY MEDICAL DOCTOR:  [facility].   HISTORIAN:  Hagen Bohorquez who is the patient's brother and health care  power of attorney.   NEUROLOGIST:  Dr. Sandria Manly at [facility].   CHIEF COMPLAINTS:  1. Seizure.  2. Coffee-ground emesis.   HISTORY OF PRESENT ILLNESS:  Mr. Mau is a 70 year old African  American male patient with history of cerebral palsy, associated mental  retardation, and history of epilepsy.  This year, the patient has had 2  prior seizures, one in February and another one 3 months ago.  Today,  the patient went to the [facility] like he does 3 times a week,  and while there, sometime between 7:30 and 9:00 a.m., the patient had a  witnessed generalized tonic-clonic seizure.  EMS was called, and the  patient was brought to the emergency room.  In the emergency room,  apparently patient had coffee-ground emesis.  An NG tube was passed and  connected to intermittent low wall suction.  Approximately 900 mL of  coffee-ground is seen in the canister.  Apart from this, patient  apparently has not had any complaints.  He is said to be compliant with  his medications.  There is no history of nausea, vomiting, abdominal  pain, constipation, or diarrhea.  He has had a BM yesterday.  His  abdomen has apparently been big since the last hospitalization sometime  in February of this year according to his brother.   PAST MEDICAL HISTORY:  1. Cerebral palsy with mental retardation.  2. Seizures/epilepsy.  3. Ventilatory-dependent respiratory failure.  4. Retropharyngeal abscess.  5. Delirium with  persistent agitation.  6. Alcohol abuse.  7. Bilaterally poor hearing.  8. Sepsis.  9. Thrombocytopenia secondary to sepsis.  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  10.Anemia of chronic disease.  1. Systolic ejection murmur.  13.Diabetes mellitus.   PAST SURGICAL HISTORY:  Surgery for retropharyngeal abscess  PEG placement.   ALLERGIES:  No known drug allergies.   MEDICATIONS:  Which were obtained from calling the [facility] by  the ED nurse:  1. Tegretol 200 mg t.i.d.  2. Haldol 1 mg t.i.d.  3. Zyprexa 5 mg every night.  4. Klonopin 2 mg every night.  5. Klonopin 0.5 mg p.o. b.i.d.   FAMILY HISTORY:  Is noncontributory.   SOCIAL HISTORY:  The patient is single.  He lives with his sister.  He  stutters, and it is difficult to understand what he says except for the  patient's family who knows him well.  He is independent of activities of  daily living.  There was some history of alcohol use when he used to  live with his other sister.  However, there is no history of alcohol use  since the beginning of this year.  There is no history of tobacco or  drug abuse.   REVIEW OF SYSTEMS:  Comprehensive 14-system reviewed done and is  noncontributory.  There is no history of peptic ulcer or GI bleed in the  past.   /PHYSICAL EXAMINATION/>  Mr. Francom is a moderately built and nourished male patient in no  obvious distress.  NG tube is connected to intermittent wall suction and  is actively draining coffee-ground emesis.  VITAL SIGNS:  Temperature maximum 99.4, blood pressure 155/104 mmHg,  pulse 118 per minute, respiration 18 per minute, saturating at 96% on  room air.  HEENT:  Is nontraumatic, normocephalic.  Pupils equally reacting to  light and accommodation.  Bilateral arcus senilis and immature  cataracts.  Oral cavity with tongue coated with black material.  There  is no evidence of  tongue bite.  NECK:  Supple.  No JVD or carotid bruit.  LYMPHATICS:  No lymphadenopathy.  RESPIRATORY SYSTEM:  With bilateral rhonchi and decreased breath sounds  in the bases.  CARDIOVASCULAR SYSTEM:  First and second heart sounds heard.  No  murmurs.  ABDOMEN:  Is distended.  Midline laparotomy scars between the  epigastrium and umbilicus.  There is a tiny scar also on the left upper  quadrant.  Nontender.  No organomegaly or mass appreciated.  Bowel  sounds seem to be diminished.  CENTRAL NERVOUS SYSTEM:  The patient is awake, alert, seems oriented to  person.  Obeys simple commands.  Speech is difficult to understand.  No  obvious cranial nerve deficits.  EXTREMITIES:  With no cyanosis, clubbing or edema.  Peripheral pulses  are symmetrically felt.  SKIN:  Is without any rashes.  MUSCULOSKELETAL SYSTEM:  Unremarkable.   LABORATORY DATA:  Serum Tegretol level 8.4.  Serum Dilantin level less  than 2.5.  Electrolytes with sodium 132, potassium 4, chloride 92,  bicarb 29, BUN 33, creatinine 0.9, glucose 178.  CBCs with hemoglobin  14.1, hematocrit 41.4, white blood cell 20.9, platelets 246.  Gastric  occult blood was positive.  Chest x-ray has been reviewed by me and  reported as new bibasilar air space opacities in the setting of diffuse  distention of the stomach and esophagus are suspicious for possible  aspiration pneumonia.   CT of the head without contrast - impression:  No change.  Frontal  atrophy and gliosis.  No acute findings.   ASSESSMENT AND PLAN:  1. Seizures/question breakthrough seizures in a patient with history      of epilepsy and cerebral palsy.  Will admit the patient to step-      down unit.  We will place on seizure precautions and obtain neuro      checks.  Will continue patient's maintenance dose of      benzodiazepines in the form of IV Ativan and use Ativan p.r.n. for      aborting seizures.  We will place the patient on IV Keppra because      cannot  provide Tegretol in intravenous from.  Neurology has been      consulted.  Question reduce the Haldol dose which can reduce the      seizure threshold.  2. Upper gastrointestinal bleed.  Rule out peptic ulcer disease versus      Mallory-Weiss tear.  Will admit to step-down.  Will place to wide      bore IV  accesses.  Will monitor hemoglobin and hematocrit      frequently.  Type and cross and transfuse p.r.n.  Continue NG tube      drainage.  Will provide IV proton pump inhibitors.  GI has been      consulted.  3. Rule out ileus versus small-bowel obstruction.  Will obtain KUB.      Continue n.p.o. and NG tube to wall suction. Consider surgery      consultation.  4. Aspiration pneumonia versus pneumonitis secondary to seizure and      emesis.  Place on empiric IV Zosyn per pharmacy and bronchodilator      nebulizers.  5. Leukocytosis secondary to pneumonia, seizure and stress.  To follow      CBCs.  6. Hyponatremia, unclear etiology.  Follow basic metabolic panel.  7. Hypertension and tachycardia.  Question secondary to GI bleed and      stress response.  Monitor closely.  Hold antihypertensive      medications for now.  8. Cerebral palsy with mental retardation.  Mental status at baseline      now according to the brother.  9. Advanced directive.  Full code.    Time spent is an hour in admitting this patient.      Marcellus Scott, MD  Electronically Signed     AH/MEDQ  D:  01/30/2008  T:  01/30/2008  Job:  [id number]   cc:   Maxwell Caul, M.D.
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
## 2010-07-05 NOTE — Op Note (Signed)
NAME:  Wesley Sullivan, Wesley Sullivan NO.:  [id number]   MEDICAL RECORD NO.:  [id number]          PATIENT TYPE:  INP   LOCATION:  1438                         FACILITY:  [facility]   PHYSICIAN:  Altha Harm, MDDATE OF BIRTH:  [birth year]   DATE OF PROCEDURE:  DATE OF DISCHARGE:                               OPERATIVE REPORT   FINAL DISCHARGE DIAGNOSES:  1. Small bowel obstruction, status post exploratory laparotomy.  2. Leukocytosis.  3. Pneumonia.  4. Sigmoid colectomy.  5. Stem gastrostomy.  6. Biopsy of small-bowel nodule.  7. Diarrhea.   DISCHARGE MEDICATIONS:  To be determined at the time of discharge.   HOSPITAL COURSE FROM DECEMBER 15 UP THROUGH TODAY:  1. Small bowel obstruction with sigmoid colectomy and exploratory      laparotomy.  The patient's problems have been managed mainly by      surgery.  The patient has been advanced to Jevity tube feedings via      gastrostomy tube, which he has been tolerating without any      difficulty.  Speech therapy was asked to see the patient for      swallow study and they have assessed that the patient does have      dysphagia.  Their recommendation is that the patient be placed on a      water protocol after oral care and to repeat the modified barium      swallow next week.  2. Resolving aspiration pneumonia.  The patient has an aspiration      pneumonia, was initially on Zosyn and switched over to Augmentin.      The patient's course of Augmentin is to be completed tomorrow,      February 13, 2008.  The patient has had a leukocytosis which is      improving.  However, today the patient started having some diarrhea      and stool was sent for C-diff.  The patient has not been starting      on any empiric anti- C-diff medications and we will continue to      follow his white blood cell count and to follow up on the C-diff      assay.  3. We will go ahead and ask physical therapy and occupational therapy      to work with  the patient to increase mobility.  The patient has      been bed-bound for quite some time now, starting on December 10,      and surely has some generalized weakness.  Increased mobility by      ambulating the patient as dictated by physical therapy.   This brings [place] up to date on the patient's hospital course up to this  point.      Altha Harm, MD  Electronically Signed     MAM/MEDQ  D:  02/12/2008  T:  02/12/2008  Job:  [id number]

## 2010-07-05 NOTE — Consult Note (Signed)
NAME:  Wesley Sullivan, Wesley Sullivan NO.:  [id number]   MEDICAL RECORD NO.:  [id number]          PATIENT TYPE:  INP   LOCATION:  3311                         FACILITY:  [facility]   PHYSICIAN:  Noel Christmas, MD    DATE OF BIRTH:  02/21/1940   DATE OF CONSULTATION:  09/26/2008  DATE OF DISCHARGE:                                 CONSULTATION   REFERRING SERVICE:  Jackie Plum, MD   REASON FOR CONSULTATION:  Recurrent seizures as well as obtunded state.   HISTORY OF PRESENT ILLNESS:  This is a 70 year old African American man  with a history of mental retardation as well as generalized seizure  disorder.  The patient presented yesterday following about 4 seizures  prior to arriving in the emergency room.  He had a subsequent  generalized seizure after arriving in the emergency room.  He was given  Ativan intravenously followed by 1250 mg of Dilantin.  He had no  recurrence of seizure activity.  The patient was seen in the emergency  room on multiple occasions during the past week following single  seizures.  He has been on Keppra 500 mg b.i.d.   The patient's Dilantin level this morning was 19.6 (normal equals to 10-  20).   PAST MEDICAL HISTORY:  Remarkable for seizure disorder, mental  retardation, DVT, dementia, diabetes mellitus, anemia of chronic  disease, and retroperitoneal abscess.   CURRENT MEDICATIONS:  1. Duragesic patch 25 mcg q.72 h.  2. Keppra 500 mg q.12 h.  3. Protonix 40 mg q.12 h.  4. Ambien 5 mg nightly p.r.n.  5. Reglan 10 mg q.12 h. P.r.n.   FAMILY HISTORY:  Noncontributory.   PHYSICAL EXAMINATION:  Appears that of a slender elderly man who was  obtunded.  He was responsive minimally to verbal stimulation.  He  withdrew limbs equally to noxious stimuli.  He did not follow simple  commands.  His pupils and extraocular movements were not normal.  He had  no facial weakness.  Muscle tone was flaccid throughout.  He had no  abnormal posturing.  He  withdrew lower extremities equally to noxious  stimuli.  Deep tendon reflexes were 2+ and symmetrical except for absent  ankle reflexes.  Plantar responses were mute.  Carotid auscultation was  normal.   CLINICAL IMPRESSION:  1. Recurrent generalized seizures on presentation, consistent with      generalized status epilepticus with good response to Ativan and      subsequently treatment with phenytoin.  2. Chronic generalized seizure disorder as well as mental retardation.  3. Obtunded state, most likely secondary to combination of postictal      state and sedation from Ativan given yesterday.  4. No signs of an acute focal intracranial abnormality.   RECOMMENDATIONS:  1. Continue current supportive measures as planned.  2. Resume Keppra with 1000 mg loading dose followed by 1000 mg q.8 h.  3. Continue Dilantin for now 100 mg q.8 h.  4. We will review MRI study and results and follow up afterwards as      indicated.   Thank you for asking me  to evaluate Wesley Sullivan.      Noel Christmas, MD  Electronically Signed     CS/MEDQ  D:  09/26/2008  T:  09/26/2008  Job:  [id number]

## 2010-07-05 NOTE — Op Note (Signed)
NAME:  Wesley Sullivan, Wesley Sullivan NO.:  [id number]   MEDICAL RECORD NO.:  [id number]          PATIENT TYPE:  INP   LOCATION:  2927                         FACILITY:  [facility]   PHYSICIAN:  Gabrielle Dare. Janee Morn, M.D.DATE OF BIRTH:  21-Aug-1940   DATE OF PROCEDURE:  04/25/2007  DATE OF DISCHARGE:                               OPERATIVE REPORT   PREOPERATIVE DIAGNOSIS:  Need for enteral access.   POSTOPERATIVE DIAGNOSIS:  Need for enteral access.   PROCEDURE:  Gastrostomy tube placement, 24-French Malecot.   SURGEON:  Gabrielle Dare. Janee Morn, MD   ANESTHESIA:  General.   HISTORY OF PRESENT ILLNESS:  Wesley Sullivan is a 70 year old gentleman  with a history of seizure disorder and mental retardation, who has had a  prolonged hospital course including ventilator dependence.  He has a  tracheostomy.  He has been successfully weaned from the ventilator.  He  has had a retropharyngeal abscess requiring drainage in the operating  room.  This was done by Dr. Gerilyn Pilgrim.  We were unable to pass an endoscope  down into his esophagus due to the residual pharyngeal edema so we are  proceeding with gastrostomy tube placement as he needs enteral access.   PROCEDURE IN DETAIL:  Informed consent was obtained from the patient's  brother.  He was brought to the preop holding area and he was identified  there.  He is receiving intravenous antibiotics.  He was brought to the  operating room.  General anesthesia was administered by the anesthesia  staff.  His abdomen was prepped and draped in a sterile fashion.  An  upper midline incision was made.  Subcutaneous tissues were dissected  down, revealing the anterior fascia.  This was divided along the  midline.  The peritoneal cavity was entered under direct vision without  difficulty.  Exploration revealed a somewhat air-filled loop of sigmoid  colon.  This was pushed down out of the way.  The stomach was inspected  and there were no obvious lesions or masses  felt.  An area on the  anterior body was selected that would come up to the left upper quadrant  abdominal wall easily.  Two concentric 2-0 silk pursestring sutures were  placed.  The Malecot tube was brought through the abdominal wall from a  stab wound in the left upper quadrant.  A small hole was made in the  center of the two concentric 2-0 silk sutures into the stomach.  The  lumen was inspected.  The Malecot was then inserted into this small  gastrotomy.  The two pursestrings were sequentially tied, bringing up a  collar of stomach around the tube securely.  The needles were left on  and then those two concentric sutures were tacked up to the inside of  the abdominal wall after pulling the tube out so the stomach was flush  with the inside of the abdominal wall.  Several more interrupted 2-0  silk sutures were used to tack the stomach to the anterior abdominal  wall circumferentially around the tube.  Once this was done, the tube  was flushed and gastric contents easily  returned.  It flushed without  difficulty.  The tube was secured to the skin with a 2-0 silk suture.  Abdominal contents were returned to anatomic position.  During the  flushing it was inspected but there was no leakage from around the tube.  The fascia was then closed with a running #1 PDS suture.  Subcutaneous  tissues  were irrigated and the skin was closed with staples.  Sponge, needle and  instrument counts were all correct.  The patient tolerated the procedure  well without apparent complication. An abdominal binder was placed to  protect the PEG.  This must remain on 24 hours a day.  He was brought to  the recovery room in stable condition.      Gabrielle Dare Janee Morn, M.D.  Electronically Signed     BET/MEDQ  D:  04/25/2007  T:  04/26/2007  Job:  [id number]   cc:   Lucky Cowboy, MD  Alvester Morin, M.D.

## 2010-07-05 NOTE — Op Note (Signed)
NAME:  Wesley Sullivan, ZAHLER             ACCOUNT NO.:  [id number]   MEDICAL RECORD NO.:  [id number]          PATIENT TYPE:  INP   LOCATION:  1233                         [facility]AC[facility]   PHYSICIAN:  Clovis Pu. Cornett, M.D.DATE OF BIRTH:  [birth year]   DATE OF PROCEDURE:  02/01/2008  DATE OF DISCHARGE:                               OPERATIVE REPORT   PREOPERATIVE DIAGNOSIS:  Small bowel obstruction.   POSTOPERATIVE DIAGNOSES:  1. Small bowel obstruction secondary to internal hernia involving      sigmoid colon mesentery.  2. Redundant sigmoid colon.  3. Small bowel nodule.  4. Dysphagia.   PROCEDURE:  1. Exploratory laparotomy with lysis of adhesions.  2. Sigmoid colectomy.  3. Stamm gastrostomy.  4. Biopsy small bowel nodule.   SURGEON:  Dortha Schwalbe, MD   ASSISTANT:  Emelia Loron, MD   ANESTHESIA:  General endotracheal anesthesia.   ESTIMATED BLOOD LOSS:  100 mL.   INTRAOPERATIVE FINDINGS:  1. Mesenteric defect involving sigmoid colon, small bowel tract in      this.  2. Extremely redundant sigmoid colon high risk for a sigmoid colon      volvulus.  3. Small nodule involving the distal small bowel.  4. Dense left upper quadrant adhesions.   INDICATIONS FOR PROCEDURE:  The patient is a 70 year old gentleman with  cerebral palsy with severe mental retardation who is able to function  though semi independently with family who was admitted due to seizure  disorder about 3 days ago.  He was found to have small bowel dilatation  and I was asked to see him yesterday.  CT showed a distal small bowel  obstruction.  He had been managed with an NG tube and unfortunately this  had not helped.  We talked with family and felt that exploratory  laparotomy for this was necessary to get him out of the hospital and  also felt that placing a gastrostomy tube would be helpful for his  postoperative nutritional management.  Risk of bleeding, infection,  small bowel injury, bowel  resection, potential colostomy or other  ostomy, wound infection, deep venous thrombosis, cardiac issues,  pulmonary issues were all potential complications of the procedure.  The  family agreed to proceed.   DESCRIPTION OF PROCEDURE:  The patient was brought to the operating room  and placed supine.  After induction of general anesthesia a midline  incision was used to gain access to his abdominal cavity.  He had a  history of an open G-tube in the past.  Upon opening his abdomen we then  used a retractor to identify the small bowel which was massively  dilated.  We found the ligament of Treitz and ran the small bowel down  to the distal ileum.  Small bowel was caught up in a hernia defect and  what appeared to be the sigmoid colon mesentery.  Part of the sigmoid  colon was inset here into left upper quadrant from a previous open  gastrostomy tube done back in March by Dr. Violeta Gelinas.  I took all  these adhesions down.  Once I did this he  had an extremely redundant  sigmoid colon.  I felt this would be at extremely high risk given the  very long mesentery it was on for potential sigmoid volvulus.  Coincidentally the defect involved this portion of the mesentery to the  sigmoid colon.  I felt that doing a limited sigmoid colon resection  would decrease his risk.  He had minimal material in the colon, was not  hypotensive, was hemodynamically stable and I felt that a resection with  anastomosis in this setting could be done safely without having to  divert him.  Using a GIA75 stapling device the distal part of the  sigmoid colon was transected.  The proximal part was also transected  with the GIA75 stapling device.  Mesentery was taken down with the  LigaSure.  I then constructed a side-to-side functional end-to-end  anastomosis using a GIA75 stapling device and TA60.  Any oozing from the  staple lines was controlled with 3-0 Vicryl.  Mesenteric defect was  closed with 3-0 Vicryl.   This corresponded with the mesenteric defect  noted before.  There was a nodule in the distal small bowel measuring  about 5 mm.  It was unclear what this was but I went ahead and did a  shave biopsy of this and closed the serosa of the small bowel with 3-0  Vicryl.  We then ran the small bowel from the ligament of Treitz down to  the cecum and everything was widely open and there was a definite  transition point where this internal hernia was involving the sigmoid  colon.  I palpated the descending and transverse colon.  I felt no  masses.  Liver felt smooth with no evidence of mass either.  The stomach  was densely adherent to the anterior abdominal wall.  I took this down  from his previous open gastrostomy tube site.  I then used this same  site for the pursestring suture of 2-0 silk around this x2.  Gastrotomy  was made.  Through the separate stab wound in the abdominal wall an 18  French Foley catheter was brought in and placed in the stomach with the  balloon inflated.  I then tied the pursestring sutures down around this.  We pulled this up to the abdominal wall and secured it using three  sutures of 2-0 silk.  The tube was secured to the skin with 2-0 nylon.  The balloon did work well.  This was then plugged at this point in time.  Irrigation was used and suctioned out.  Small bowel was viable and  placed back in the abdominal cavity after suction of the irrigation.  We  then closed his fascia after assuring adequate instrument and sponge  count with double-stranded #1 PDS.  Skin was closed with staples since  there was minimal contamination.  Dry dressings were applied.  All final  counts, sponge, needle and instruments were found to be correct at this  portion of the case.  The patient was then taken to the PACU in  satisfactory condition.      Thomas A. Cornett, M.D.  Electronically Signed     TAC/MEDQ  D:  02/01/2008  T:  02/01/2008  Job:  [id number]   cc:   [facility]

## 2010-07-05 NOTE — Discharge Summary (Signed)
NAME:  Wesley Sullivan, PINN NO.:  [id number]   MEDICAL RECORD NO.:  [id number]          PATIENT TYPE:  INP   LOCATION:  1435                         FACILITY:  [facility]   PHYSICIAN:  Theodosia Paling, MD    DATE OF BIRTH:  February 26, 1940   DATE OF ADMISSION:  01/30/2008  DATE OF DISCHARGE:  02/17/2008                               DISCHARGE SUMMARY   For the detailed discharge summary, please refer to the discharge  summary note dictated under operative report on February 12, 2008, by  Dr. Marthann Schiller for diagnoses, hospital course and discharge  medications.   ADDENDUM:  Since February 12, 2008, essentially the following has  transpired.  The patient has been having intermittent episodes of  agitation.  He has been needing restraints and is on p.r.n. Haldol and  Ativan.  CT scan of the brain was found to be negative for any acute  pathology.  Also, the patient did not have any electrolyte disturbance  of active infection anywhere.  Therefore, a psych consult will be  performed tomorrow to evaluate if we can start a prolonged antipsychotic  like Zyprexa which can control his agitation.  Other than that, he  remained hemodynamically stable.  He completed his course of Augmentin  on February 13, 2008.  At this time, he is currently NG tube feeds.   DISPOSITION:  SNF placement and management of delirium is pending.   Total time spent in dictation around 25 minutes.      Theodosia Paling, MD  Electronically Signed     NP/MEDQ  D:  02/17/2008  T:  02/17/2008  Job:  [id number]

## 2010-07-05 NOTE — Discharge Summary (Signed)
NAME:  Wesley Sullivan, Wesley Sullivan NO.:  [id number]   MEDICAL RECORD NO.:  [id number]          PATIENT TYPE:  INP   LOCATION:  1233                         FACILITY:  [facility]   PHYSICIAN:  Monte Fantasia, MD  DATE OF BIRTH:  October 03, 1940   DATE OF ADMISSION:  01/30/2008  DATE OF DISCHARGE:                               DISCHARGE SUMMARY   PRIMARY MEDICAL DOCTOR:  In [facility].   NEUROLOGIST:  Dr. Sandria Manly at [facility].   GASTROENTEROLOGIST:  [facility] Dr. Leanord Hawking and surgeon is Dr.  Harriette Bouillon.   OPERATIVE PROCEDURE:  Done on February 01, 2008:  1. Exploratory laparotomy with lysis of adhesions.  2. Sigmoid colectomy.  3. Stem gastrostomy.  4. Biopsy of the small bowel nodule.   COURSE DURING HOSPITAL STAY:  Mr. Bloom 70 year old African American  male patient with a history of cerebral palsy and associated mental  retardation and history of epilepsy came and was admitted to the ER on  December 10 with a generalized tonic-clonic seizures, had 3 episodes.  In the  emergency room the patient had coffee-ground emesis and NG tube  was put in with low suction.  The patient was kept n.p.o. and was  started on IV Keppra for his seizures and started on IV Protonix along  with the NG tube suctioning at low suction.  GI was consulted and  recommended to monitor H and H with the PPI drip and advise for surgical  consult.  The patient was also evaluated by neurology and agreed with  the IV Keppra and with seizures and fall precautions.  The x-rays of the  abdomen were done on December 11 and on admission December 10.  Both  showed persistent small bowel obstruction.  As per the GI per  recommendations surgery was consulted and CT of the abdomen and pelvis  was done which showed diffuse small bowel dilatation with the distal  small bowel obstruction, the smooth transition point at the level of  terminal ileum.  The patient then underwent an exploratory  laparotomy on  the 12th with sigmoid colectomy and lysis of the adhesions, insertion of  the stem gastrostomy tube with the small bowel nodule biopsy.  The  patient has been kept n.p.o. since then and had a PICC line placed for  TPN as per the surgery recommendations.  The patient needs a frequent  suctioning with good frequent chest physical therapy and pulmonary  toilet.  The patient has been on IV antibiotics; vancomycin and Zosyn  for his for he is for the leukocytosis which he had since admission for  a possible silent aspiration.  The patient has been having an uneventful  postop recovery until now and has been monitored closely followed up by  surgery.   RADIOLOGICAL INVESTIGATIONS DONE DURING THE HOSPITAL STAY:  1. Chest x-ray done on January 30, 2008.  Impression:  New basilar      air space opacities in the setting of diffuse distention of the      stomach and esophagus suspicious for possible aspiration pneumonia.  2. Abdominal x-ray done on January 30, 2008, shows small bowel gas      pattern suggestive of small bowel obstruction.  NG tube needs to be      advanced several centimeters into the stomach.  No free air      persistent bibasilar lung opacities.  3. Chest x-ray done in view of the PICC line on January 30 2008,      shows the right PICC line going up to the internal jugular vein and      needs to be retracted and repositioned.  4. Chest x-ray done on January 30, 2008.  Stable position of right      PICC line with the tip entering the internal jugular vein.  5. Chest x-ray done on January 30, 2008.  X-ray impression:  Tip of      the PICC line now at cavo-atrial junction.  6. Abdominal x-ray done on January 31, 2008.  Impression:  NG tube      placement with decompression of the stomach, Persistent small bowel      obstruction pattern, no free intraperitoneal air gas pattern.  7. CT of the abdomen with contrast done on January 31, 2008.      Impression:  to  1009 inches.  Impression no acute intra-abdominal      finding.  Multilobar air space disease compatible with aspiration      pneumonia.  8. Chest x-ray done on January 23, 2008.  Impression:  Mildly      distended air-filled loops of the bowels overall decreased air-      filled loops of the bowel since the previous exam.  9. Chest x-ray done February 03, 2008.  Impression:  Some worsening of      bibasilar opacities particularly on the left mid and upper lung      fields.   LABS DONE DURING THE STAY IN THE HOSPITAL:  Total WBC 12.8, hemoglobin  9.7, hematocrit 28.6, platelets 176.  Sodium 136, potassium 3.6,  chloride 105, bicarb 26, glucose 150, BUN 12, creatinine 0.7, calcium  8.3, phosphorus 2.3, magnesium 2.2.  UA has been negative.  Blood  cultures x2 has been no growth to date done on January 31, 2008.   MEDICATIONS DURING THE HOSPITAL STAY:  1. Vancomycin 750 mg IV every 8 hours started on February 02, 2008.  2. Zosyn 3.375 g every 8 hours started on January 30, 2008.  3. Albuterol Atrovent nebulizations Duragesic patch 25 mcg every 72.  4. IV Keppra 500 mg every 12.  5. Ativan 2 mg nightly for agitation.  6. Haldol 1 mg IV t.i.d. for agitation.  7. Protonix 40 mg IV every 12.  8. Lovenox 40 mg subcutaneously daily insulin coverage.   PHYSICAL EXAMINATION:  VITALS:  Temperature of 98.5.  Heart rate of 103.  Blood pressure 158/99.  Oxygen saturations 99% on 3 L.  Respiratory rate  of 28.  HEENT:  Neck is supple.  Pupils equal reacting to light.  No pallor, no  lymphadenopathy.  RESPIRATORY:  Crackles on the left side of the mid lung fields  bilaterally.  Air entry is equal.  CARDIOVASCULAR:  S1, S2 is normal, regular rate and rhythm.  No murmurs  heard.  ABDOMEN:  Soft, no distention, no tenderness, no guarding, no rigidity.  Vertical midline scar of exploratory laparotomy dressing dry.  Bowel  sounds are sluggish.  EXTREMITIES:  No edema feet.  CNS:  The patient is  sedated, however response to his name.  Has  received  his Ativan for agitation.   ASSESSMENT AND PLAN:  1. Small bowel obstruction status post exploratory laparotomy and      sigmoid colectomy with the gastrostomy tube and small bowel nodule      biopsy.  2. Aspiration pneumonia.  3. Breakthrough seizures.  4. Gastrointestinal bleed.  5. Cerebral palsy.  6. Mental retardation.  7. Acute renal insufficiency which is resolved.  8. Leukocytosis secondary to aspiration pneumonia.   PLAN:  Is to continue on vancomycin and Zosyn.  Blood cultures have been  no growth to date.  The patient needs frequent suctioning with  aggressive pulmonary toilet, aspiration precautions, elevation of head  end of the bed to 30 degrees and chest physical therapy.   Surgery on board with followup for his status post surgery day 3 today.  No new recommendations.  We will continue total parenteral nutrition for  now due to the exploratory laparotomy and paralytic ileus, to continue  bronchodilators also.   We will continue IV Keppra.   We will continue IV hydration for renal insufficiency which is improving  well, DVT and GI prophylaxis given.      Monte Fantasia, MD  Electronically Signed     MP/MEDQ  D:  02/04/2008  T:  02/04/2008  Job:  [id number]

## 2010-07-05 NOTE — Consult Note (Signed)
NAME:  Wesley Sullivan, Wesley Sullivan NO.:  [id number]   MEDICAL RECORD NO.:  [id number]          PATIENT TYPE:  INP   LOCATION:  0104                         FACILITY:  [facility]   PHYSICIAN:  Marlan Palau, M.D.  DATE OF BIRTH:  12-04-1940   DATE OF CONSULTATION:  01/30/2008  DATE OF DISCHARGE:                                 CONSULTATION   The patient also has records under the medical record number [id number].   HISTORY OF PRESENT ILLNESS:  Wesley Sullivan is a 70 year old black male  born 1940/07/12 with a history of mental retardation, prior right  frontal lobe contusion or infarct, and history of seizures.  This  patient is followed by Dr. Sandria Manly through our practice for seizures that  have not been fully controlled.  The patient last had a seizure event in  June 2009.  The patient had been on Dilantin and Tegretol, but  apparently stopped his Dilantin when he ran out of his medications on  the prescription.  The patient, however, has continued on his Tegretol  and appears to be taking the medication faithfully.  The patient is on  carbamazepine 200 mg 3 times daily.  The patient has had a prior history  of alcohol abuse but has stopped this in the recent past.  The patient  comes to [facility] with onset of a seizure event that  was noted while in the adult home, and when the patient got to the  emergency room the patient was noted to have a distended abdomen with a  heme positive emesis.  The patient appears to have an active upper GI  bleed.  CT scan of the brain done shows the old right frontal lobe  contusion or infarct that has been noted previously on various scans.  Last MRI study done through our office was in August 2009, again showing  a right frontal infarct or contusion.  Slight pons ischemia was noted at  that time.  The patient, at this point, is minimally verbal but is  clearly alert, following commands.  Neurology was asked to see this  patient for further evaluation.   PAST MEDICAL HISTORY:  1. 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  2. Seizure secondary to number 1.  3. History of alcohol abuse in the past.  4. Organic brain syndrome/mental retardation.  5. Retropharyngeal abscess requiring surgery.  6. History of delirium state in the past.  The patient had a G tube      placed that has subsequently been removed.  7. History of decreased auditory acuity.  8. History of anemia of chronic disease in the past.  9. History of diabetes.   ALLERGIES:  Patient has no known allergies.   HABITS:  Does not smoke or drink.   MEDICATIONS PRIOR TO THIS ADMISSION:  1. Clonazepam 0.5 mg 1 twice daily.  2. The patient was off Dilantin.  3. Was on Haldol 1 mg 3 times daily.  4. Clonazepam 2 mg at night.  5. Zyprexa 5 mg at night.  6. Carbamazepine 200 mg 3  times daily.   SOCIAL HISTORY:  This patient is single.  The patient lives in  [place], [place] I believe in an adult or group home.  The patient  does have least 1 sister and 1 brother.   FAMILY MEDICAL HISTORY:  Mother died at age 19 with history of  hypertension.  Father died at age 61 with myocardial infarction.  The  patient had a brother who died at age 70 with lung cancer.  Another  brother and another sister, possibly up to 3 sisters.  Father had heart  disease.   REVIEW OF SYSTEMS:  Cannot be obtained very well.  The patient really  does not respond to verbal questions.   PHYSICAL EXAM:  CONSTITUTIONAL:  Will try to say his name, but speech is  quite garbled and unintelligible.  Again, the patient is alert and  cooperative otherwise.  HEENT:  Extraocular appeared to be full.  Pupils round, react to light.  Disks are flat.  NECK:  Supple.  No carotid bruits noted.  RESPIRATORY:  Examination is clear.  The patient is taking short breaths  with low volume.  CARDIOVASCULAR:  Patient with a regular rate and rhythm.  No obvious   murmurs, rubs noted.  ABDOMEN:  Quite distended.  Coffee ground drainage is noted from the  [facility] sump.  Decreased bowel sounds noted.  Healed midline abdominal  scar is noted.  EXTREMITIES:  The patient has no evidence of peripheral edema.  NEUROLOGIC EXAMINATION:  Cranial nerves as above.  Facial symmetry is  present.  The patient blinks to threat bilaterally.  Full extraocular  movements noted.  Patient has good symmetric smile.  The patient will  move all 4 well.  Good strength is noted all 4.  Patient has good finger-  nose-finger and good toe to finger bilaterally.  Gait was not tested.  Deep tendon reflexes depressed but symmetric.  Toes neutral bilaterally.  No drift is seen in the upper extremities.   LABORATORY VALUES:  Notable for a white count of 20.9, hemoglobin 14.1,  hematocrit 41.4, MCV of 91.6, platelets of 246,000, glucose of 133,  sodium 132, potassium 4, chloride of 92, glucose 178, BUN of 33,  creatinine of 0.9, carbamazepine level of 8.4.  Dilantin level less than  2.5.   IMPRESSION:  1. History of chronic seizures under poor control with recent      recurrence today.  2. Upper gastrointestinal bleed appears to be quite active.  3. Mental retardation.  4. Decreased auditory acuity.  5. History of right frontal lobe contusion or stroke.   This patient has had a recurrent seizure today.  The patient's acute  problem, however, is a fairly active upper GI bleed.  The patient will  be admitted and managed for this.  At this point the patient is n.p.o.  and will have to be  converted to IV drug.  Will use Keppra 1000 mg load to 500 mg IV q.12  hours.  Will follow the patient's clinical course from there.  Dilantin  can be added if needed.  We will check urine drug screen and will follow  the patient off and on through this hospitalization.      Marlan Palau, M.D.  Electronically Signed     CKW/MEDQ  D:  01/30/2008  T:  01/30/2008  Job:  [id number]   cc:    [facility]  [address]  [address]   Maxwell Caul, M.D.

## 2010-07-05 NOTE — Op Note (Signed)
NAME:  Wesley Sullivan, Wesley Sullivan NO.:  [id number]   MEDICAL RECORD NO.:  [id number]          PATIENT TYPE:  INP   LOCATION:  2109                         FACILITY:  [facility]   PHYSICIAN:  Felipa Evener, MD  DATE OF BIRTH:  18-Jun-1940   DATE OF PROCEDURE:  DATE OF DISCHARGE:                               OPERATIVE REPORT   PROCEDURE PERFORMED:  Percutaneous tracheostomy at bedside.   After explaining the risks and benefits of the procedure to the patient  and family, they signed informed consent and procedure performed in the  patient's room 2109 in medical ICU.  After giving the patient 350 mcg IV  Fentanyl, 50 mg IV Versed, and 20 mL of propofol, as well as 10 mg of  vecuronium, the neck was cleaned and lidocaine was injected.  Skin  incision was made.  Blunt dissection to the airway was done two  cartilaginous collars below the cricoid.  The airway was entered with  the needle, guidewire was  passed followed by dilator.  The tracheostomy  was placed and sutured with the lumen  and clearly visualized by the  bronchoscope but bronchoscopically the ET tube was 5 cm above the  carina.  It was sutured.  A BAL was done and bronchial secretions were  removed.  The bronchoscope was slowly removed.  Chest x-ray ordered and  pending.  Patient tolerated the procedure well with no complications.      Felipa Evener, MD  Electronically Signed     WJY/MEDQ  D:  04/16/2007  T:  04/17/2007  Job:  [id number]

## 2010-07-05 NOTE — Op Note (Signed)
NAME:  Wesley Sullivan, MENDIZABAL NO.:  [id number]   MEDICAL RECORD NO.:  [id number]           PATIENT TYPE:   LOCATION:                               FACILITY:  [facility]   PHYSICIAN:  Gabrielle Dare. Janee Morn, M.D.DATE OF BIRTH:  Apr 21, 1940   DATE OF PROCEDURE:  04/23/2007  DATE OF DISCHARGE:                               OPERATIVE REPORT   PREOPERATIVE DIAGNOSES:  1. Need for enteral access.  2. Respiratory failure with tracheostomy.  3. Recent pharyngeal abscess.   POSTOPERATIVE DIAGNOSES:  1. Need for enteral access.  2. Respiratory failure with tracheostomy  .  3. Recent pharyngeal abscess.   PROCEDURE:  Attempted percutaneous endoscopic gastrostomy tube  placement.   INDICATIONS FOR PROCEDURE:  The patient is a 70 year old gentleman who  has had a prolonged hospital course, including ventilator- dependent  respiratory failure.  He also developed a pharyngeal abscess that was  drained in the operating room by ENT.  We are attempting to proceed with  a PEG tube placement today at the bedside in the intensive care unit.  Follow-up CT scan of his neck yesterday demonstrated a significant  decrease in size of his retropharyngeal abscess.   DESCRIPTION OF PROCEDURE:  An informed consent was obtained from the  patient's brother.  He remains monitored in the intensive care unit.  He  did receive muscle relaxation, pain medication and sedation IV.  A mouth  gag was placed.  The esophagogastric duodenoscope was inserted into his  mouth, down into his pharynx.  We could visualize his vocal cords and  epiglottis, but despite numerous attempts were not able to pass the  scope into his esophagus.  There was a significant amount of edema  there.  We tried several times and in addition we tried removing the  bite block and retracting his tongue.  Due to a significant amount of  edema there, we were not able to pass the esophagogastric duodenoscope  into his esophagus, so the procedure  was terminated.  The patient  remained hemodynamically stable throughout.   We will have to proceed with an open gastrostomy tube placement.  We  will try to do that on April 24, 2007.      Gabrielle Dare Janee Morn, M.D.  Electronically Signed     BET/MEDQ  D:  04/23/2007  T:  04/23/2007  Job:  [id number]

## 2010-07-05 NOTE — Consult Note (Signed)
NAME:  Wesley Sullivan, Wesley Sullivan             ACCOUNT NO.:  [id number]   MEDICAL RECORD NO.:  [id number]          PATIENT TYPE:  INP   LOCATION:  1233                         [facility]ACILITY:  [facility]   PHYSICIAN:  Clovis Pu. Cornett, M.D.DATE OF BIRTH:  1940/06/06   DATE OF CONSULTATION:  01/30/2008  DATE OF DISCHARGE:                                 CONSULTATION   REASON FOR CONSULTATION:  Abdominal distention, small bowel obstruction.   HISTORY OF PRESENT ILLNESS:  The patient is a 70 year old African  American male with significant history of cerebral palsy, mental  retardation and history of epilepsy who was admitted on January 30, 2008, to the medical service due to seizure disorder.  He was witnessed  on December 10 to have a generalized tonic clonic seizure.  He also had  coffee ground emesis.  The NG tube was placed and approximately 900 mL  of coffee ground emesis was seen.  He did not have any complaints.  He  is nonverbal with me today so I get no history whatsoever from the  patient.  He is sleeping comfortably and is not arousable.  I do not  know what his baseline status is.  His last bowel movement was on  December 9.  Apparently he has had distended abdomen since he was in the  hospital back in February according to his brother.  I was asked to see  him at the request of internal medicine because his flat plate showed  small bowel distention.  This is read by the radiologist as small bowel  obstruction.   PAST MEDICAL HISTORY:  1. Cerebral palsy with mental retardation.  2. Seizure epilepsy.  3. Previous history of an abdominal procedure of some sort, type      unknown.  4. History of a feeding tube that was removed in April by report but      no documentation of this.   PAST SURGICAL HISTORY:  Please see above.   ALLERGIES:  None.   MEDICATIONS:  1. Include Tegretol 200 mg t.i.d.  2. Haldol 1 mg t.i.d.  3. Zyprexa 5 mg at nighttime.  4. Klonopin 2 mg at night and Klonopin  0.5 mg p.o. q.i.d.   FAMILY HISTORY:  Noncontributory to this admission.   SOCIAL HISTORY:  He lives with his sister.  He is single.  He stutters  apparently at baseline.  He is difficult to understand except for  family.  Apparently he lives with independent activities of daily  living.  He does have a history of alcohol use in the past.  No tobacco  or drug use.   REVIEW OF SYSTEMS:  Unobtainable.  He has no history of upper GI bleed  or any history of endoscopy.   PHYSICAL EXAMINATION:  VITAL SIGNS:  Temperature is 97, pulse 102, blood  pressure 150/71, respiratory rate 30.  GENERAL APPEARANCE:  A male sleeping, currently he does not arouse  easily but he is protecting his airway well.  HEENT:  Dry mucous membranes.  Nasogastric tube in nose.  NECK:  Supple.  Nontender.  No mass.  CHEST:  Clear to auscultation except for some occasional rales.  CARDIOVASCULAR:  Slight tachycardia, otherwise extremities warm and well-  perfused.  ABDOMEN:  Soft, minimally distended.  Midline scar noted.  No rebound.  No guarding.  No mass.  No evidence of ventral hernia.  GU:  No evidence of inguinal hernia bilaterally.  Testes appear normal  as well as phallus.  EXTREMITIES:  Muscle tone appears normal.  No evidence of any  significant swelling.  NEUROLOGICAL:  Unobtainable currently since he is sleeping and somewhat  obtunded.  Apparently at baseline he will obey simple commands.  Speech  is very difficult to understand.   LABORATORY STUDIES:  I have reviewed his plain films which show  significant small bowel dilatation as well as what appears to me to be  colonic dilatation.  His white count is 14,300, hemoglobin 12, platelet  count 164,000, creatinine 1.2, electrolytes otherwise within normal  limits.   IMPRESSION:  1. Abdominal distention.  2. Cerebral palsy with severe mental retardation.  3. History of seizure disorder.  4. History of upper gastrointestinal bleed.   PLAN:  He  will need a CT scan to further evaluate his small bowel  distention which I was asked to see him for.  He does have risk factors  for significant ileus but given his previous abdominal surgery small  bowel obstruction will need to be ruled out with a test better than  plain films.  He will need both NG tube contrast as well as IV contrast  for that.  Currently, he does not have an acute physical abdomen.  We  will follow along and await the above stated studies.      Thomas A. Cornett, M.D.  Electronically Signed     TAC/MEDQ  D:  01/31/2008  T:  01/31/2008  Job:  [id number]   cc:   Monte Fantasia, MD

## 2010-07-05 NOTE — Procedures (Signed)
EEG 10 - [id number].   CLINICAL HISTORY:  The patient is a 70 year old, who presented to the  emergency department after multiple tonic-clonic seizures.  There is a  history of mental retardation, generalized seizure disorder, diabetes  mellitus, renal failure.  Study is being done to look for the presence  of seizures (780.39).   PROCEDURE:  The tracing is carried out on a 32-channel digital Cadwell  recorder reformatted into 16 channel montages with one devoted to EKG.  The patient was lethargic during the recording, but had periods of  arousal.   Medications include Protonix, Dilantin, Lovenox, Tylenol, Keppra,  potassium chloride, Ciprofloxacin, Flagyl, morphine, Ativan, and Zofran.  The [employer] 10/20 system lead placement was used.   DESCRIPTION OF FINDINGS:  The dominant frequency with the patient awake  is 7-8 Hz, 35 microvolt activity seen in the posterior regions.   With drowsiness, mixed frequency 2-3 Hz 30 microvolt delta and 5-7 Hz 15-  25 microvolt theta range activity predominate.   There is no focal slowing in the background.  There is no interictal  epileptiform activity in the form of spikes or sharp waves.  Activating  procedures were not carried out.  EKG showed regular sinus rhythm with  ventricular response of 90 beats per minute.   IMPRESSION:  Abnormal electroencephalogram on the basis of mild diffuse  background slowing.  This is a nonspecific indicator of neuronal  dysfunction that maybe on a primary degenerative basis or related to  variety of toxic or metabolic etiologies.  A postictal state from a  history of mental retardation could be among the etiologies responsible  for this record.      Deanna Artis. Sharene Skeans, M.D.  Electronically Signed     ZOX:WRUE  D:  09/28/2008 20:45:19  T:  09/29/2008 45:40:98  Job #:  [id number]

## 2010-07-05 NOTE — Op Note (Signed)
NAME:  CID, AGENA NO.:  [id number]   MEDICAL RECORD NO.:  [id number]          PATIENT TYPE:  INP   LOCATION:  2927                         FACILITY:  [facility]   PHYSICIAN:  Gabrielle Dare. Janee Morn, M.D.DATE OF BIRTH:  1940/08/13   DATE OF PROCEDURE:  04/26/2007  DATE OF DISCHARGE:                               OPERATIVE REPORT   PREOPERATIVE DIAGNOSIS:  Dislodged gastrostomy tube.   POSTOPERATIVE DIAGNOSIS:  Dislodged gastrostomy tube.   PROCEDURE:  Replacement gastrostomy tube with a 22-French Foley, 10 mL  in the balloon.   SURGEON:  Gabrielle Dare. Janee Morn, M.D.   ANESTHESIA:  General.   HISTORY OF PRESENT ILLNESS:  Mr. Freilich is a 70 year old gentleman  with a history of seizure disorder and mental retardation who underwent  open gastrostomy tube placement yesterday.  Despite 4-point restraints  and an abdominal binder, he dislodged his gastrostomy tube into the  subcutaneous tissues earlier today and he is brought emergently to the  operating room for replacement.   PROCEDURE IN DETAIL:  Informed consent was obtained from the patient's  sister.  He was brought to the preop holding area.  He was identified  there.  He is currently receiving intravenous antibiotics.  He was  brought to the operating room.  General anesthesia was administered.  His abdomen was prepped and draped in a sterile fashion, including the  original [place] tube.  His staples were removed from his midline wound  and a PDS suture was removed.  The fascia was opened and the abdomen was  explored.  There was no free fluid.  His stomach remains pexied very  well up to the anterior abdominal wall.  The Mal[place]ot tube was removed;  it had popped into the subcutaneous tissues.  A 22-French Foley was then  placed through the old gastrotomy tube site and it passed easily  directly into the stomach under direct palpation and the balloon was  inflated.  It was pulled back so that the balloon was in  the stomach  away from the pylorus.  The tube was secured to the skin with a 2-0 silk  suture.  The tube was flushed and water flushed easily and gastric-  tinged contents returned into the [place] syringe.  The tube was corked  off.  The stomach was reinspected where it was adherent to the abdominal  wall and there was no leakage there with flushing of the tube.  We  checked sponge, needle and instrument counts and then the fascia was  closed with a  running #1 PDS suture.  Subcutaneous tissues were irrigated.  The skin  was closed with staples.  A sterile dressing was applied.  An abdominal  binder was also placed to protect this gastrostomy tube.  He was taken  to the recovery room in stable condition.      Gabrielle Dare Janee Morn, M.D.  Electronically Signed     BET/MEDQ  D:  04/26/2007  T:  04/27/2007  Job:  [id number]   cc:   Lucky Cowboy, MD  Alvester Morin, M.D.

## 2010-07-05 NOTE — Consult Note (Signed)
NAME:  JAQUAIL, MCLEES NO.:  [id number]   MEDICAL RECORD NO.:  [id number]          PATIENT TYPE:  INP   LOCATION:  2109                         FACILITY:  [facility]   PHYSICIAN:  Antonietta Breach, M.D.  DATE OF BIRTH:  [birth year]   DATE OF CONSULTATION:  04/12/2007  DATE OF DISCHARGE:                                 CONSULTATION   REASON FOR CONSULTATION:  Psychosis, agitation.   REQUESTING PHYSICIAN:  Manning Charity, M.D.   Mr. Liliana Brentlinger is a 70 year old male admitted to the [facility]  on March 14, 2007, due to fever and acute mental status changes.   The patient has experienced greater than 4 days of poor attention, easy  distractibility, agitation and internal stimulation and clouding of  consciousness.  He has been receiving Haldol p.r.n.; however, he is not  on a standing antipsychotic regimen.  His symptoms are interfering with  his general medical care.  He obviously is having internal distress with  them.   There was discussion of utilizing Zyprexa; however, there has been  difficulty in obtaining an appropriate route.  Intramuscular Zyprexa was  not available.   PAST PSYCHIATRIC HISTORY:  Mr. Aliano has a history of mental  retardation listed in the past medical record, also listed is  concomitant seizure disorder, and he has a history of being on Dilantin  as well as phenobarbital.   Also listed in the past medical record is psychotic disorder, also  listed alcohol abuse.  The patient is a poor historian.   Home medications have included Zyprexa as well as trazodone.   FAMILY PSYCHIATRIC HISTORY:  None known.   SOCIAL HISTORY:  The patient is single.  Occupation:  Medically  disabled.  Residence:  He was living with his sister.  Education is not  known.   PAST MEDICAL HISTORY:  Mental retardation, seizure disorder, delirium.   MEDICATIONS:  The MAR is reviewed.  The patient has NO KNOWN DRUG  ALLERGIES.  He is on the following  psychotropic medications:  Haldol 5  mg 1 time, Zyprexa 5 mg p.o. q.h.s.  Please see the discussion above.  He is on phenobarbital.   The patient is still intubated and has ventilatory-dependent respiratory  failure.  They are trying to wean him.   LABORATORY DATA:  Sodium 136, BUN 13, creatinine 0.65.   WBC 13.4, hemoglobin 7.7, platelet count  __________.  SGOT 16, SGPT 6.   REVIEW OF SYSTEMS:  Constitutional, head, eyes, ears, nose, throat,  mouth, neurologic, psychiatric, cardiovascular, respiratory,  gastrointestinal, genitourinary, skin, musculoskeletal, hematologic,  lymphatic, endocrine, metabolic all unremarkable.   PHYSICAL EXAMINATION:  GENERAL APPEARANCE:  Mr. Bolender is an elderly  male lying in a supine position in his hospital bed with no abnormal  involuntary movements.   OTHER MENTAL STATUS EXAM:  Mr. Lalani has stupor.  He is partially  alert.  His eye contact is intermittent.  His concentration is  decreased.  His affect is agitated.  Mood is anxious.  He is grossly  oriented to person only.  His memory function is impaired.  His fund of  knowledge and intelligence are below that of average and below that of  his premorbid baseline.  Speech is not intelligible, thought process not  intelligible.  Thought content:  There is evidence of internal  stimulation.  Insight is poor.  Judgment is impaired.   ASSESSMENT:  AXIS I:  1. 293.00, delirium not otherwise specified.  2. Rule out alcohol abuse versus dependence.  3. If mental retardation is present in the past it is not a factor in      the current delirium of significance.  AXIS II:  Deferred.  AXIS III:  See general medical section.  AXIS IV:  General medical.  AXIS V:  20.   RECOMMENDATIONS:  Would start standing Haldol at 1.5 mg t.i.d. for  __________ psychosis, keeping in mind that the duration of delirium can  vary from a few hours to literally months and that the psychotropic  efficacy of an  antipsychotic dose depends upon a standing regimen.  Haldol is ideal because of its limited pharmacodynamic action in D2  blockade without concomitant confounding factors of anticholinergic or  other action.  It also has the versatility of route administration.   Would also continue the Haldol p.r.n.   Low stimulation ego support.  Further psychiatric care to be determined  as the patient progresses through his clinical course.   Would check a QTc and discontinue antipsychotic therapy if the QTc is  above 500 milliseconds.      Antonietta Breach, M.D.  Electronically Signed     JW/MEDQ  D:  04/12/2007  T:  04/13/2007  Job:  [id number]

## 2010-07-05 NOTE — Op Note (Signed)
NAME:  Wesley Sullivan, SHINER NO.:  [id number]   MEDICAL RECORD NO.:  [id number]          PATIENT TYPE:  INP   LOCATION:  2109                         FACILITY:  [facility]   PHYSICIAN:  Lucky Cowboy, MD         DATE OF BIRTH:  [birth year]   DATE OF PROCEDURE:  04/19/2007  DATE OF DISCHARGE:                               OPERATIVE REPORT   PREOPERATIVE DIAGNOSIS:  Retropharyngeal abscess.   POSTOPERATIVE DIAGNOSIS:  Retropharyngeal abscess.   PROCEDURE:  Transoral incision and drainage of retropharyngeal abscess.   SURGEON:  Dr. Lucky Cowboy.   ANESTHESIA:  General endotracheal anesthesia.   ESTIMATED BLOOD LOSS:  30 mL.   SPECIMENS:  Cultures - aerobic, anaerobic and fungal of retropharyngeal  fluid as well as stat Gram stain.   COMPLICATIONS:  None.   INDICATION:  This patient is a 70 year old mentally retarded male who  presented on April 05, 2007.  He was febrile and had an elevated  white blood cell count.  He was quite ill and was in shock.  He was  noted to have pneumonia.  The patient required intubation.  Due to  problems with fever, CT scan of the neck was performed.  This  demonstrated some retropharyngeal air but no definite cellulitis or  phlegmon.  Followup CT scan was performed due to continued infection  signs, and this revealed increasing air in the retropharynx and  mediastinum but, again, no evidence of definite infection.  The patient  was treated with antibiotic therapy and the pneumonia definitely  improved.  Over the past few days, there has been increasing white blood  cell count and temperature to as high as 102.9.  For this reason, CT  scan of the neck was performed on April 16, 2007.  This revealed a  large retropharyngeal phlegmon with intense retropharyngeal edema and  what appeared to be micro abscesses in the process of coalescing.  For  these reasons, incision and drainage was performed.  Risks, benefits and  options were discussed  with the patient's brother, Dawsen Krieger.  This discussion occurred approximately 10:00 p.m. yesterday evening;  however, the patient's blood pressure was low, and he required  stabilization prior to being taken to the operating room.   FINDINGS:  The patient was noted to have approximately 8 mL of dishwater-  type fluid in the retropharyngeal space with approximately 4 cm of  retropharyngeal edema.  This tract extended inferiorly for approximately  10 cm.   PROCEDURE:  The patient was taken to the operating room and placed on  the table in the supine position.  He was already sedated and on the  ventilator through an existing tracheotomy tube and lines.  The patient  was then placed under general anesthesia.  The head and body were  draped.  A Crowe-Davis mouth gag with a #4 tongue blade was then placed  intraorally, opened and suspended on the Mayo stand.  Palpation of the  posterior pharyngeal wall identified the findings as noted above.  Bovie  cautery in a blend mode using cutting of 35 and coagulation of  25 was  then used to incise the midline of the posterior pharyngeal wall over a  length approximately 3 cm.  This then entered the abscess cavity.  Cultures were obtained as identified prior.  A tonsil hemostat was then  used to enter this abscess cavity and extended inferiorly.  The area was  then copiously irrigated very gently with normal saline.  A quarter inch  Penrose was then placed down into the depths of the abscess cavity and  brought out through the nose after connecting it to an umbilical tape.  A #2-0 silk suture was used to perform a through-and-through stitch  through the internasal septum.  This was then tied and then secured to  the umbilical tape.  Umbilical tape was further taped to the right  cheek.  The patient was then returned to the intensive care unit in  stable condition.      Lucky Cowboy, MD  Electronically Signed     SJ/MEDQ  D:  04/19/2007   T:  04/20/2007  Job:  [id number]

## 2010-07-05 NOTE — Consult Note (Signed)
NAME:  Wesley Sullivan, Wesley Sullivan             ACCOUNT NO.:  [id number]   MEDICAL RECORD NO.:  [id number]          PATIENT TYPE:  INP   LOCATION:  2109                         FACILITY:  [facility]   PHYSICIAN:  Pramod P. Pearlean Brownie, MD    DATE OF BIRTH:  [birth year]   DATE OF CONSULTATION:  04/05/2007  DATE OF DISCHARGE:                                 CONSULTATION   REFERRING PHYSICIAN:  Manning Charity, MD   REASON FOR REFERRAL:  Altered mental status and seizure.   HISTORY OF PRESENT ILLNESS:  Mr. Hillhouse is a 70 year old African  American gentleman who was brought to [facility] by EMS [place].  The fall was apparently witnessed  by a friend, who is not here at present to corroborate the history.  The  patient's sister had previously stated to the admitting internal  medicine resident, and as per the friend who witnessed, the patient was  having a seizure.  The patient was brought to [facility] emergency room  unresponsive with eyes closed.  He has a history of seizure disorder and  takes Dilantin.  He does see Dr. Sandria Manly in our office.  The patient has  also history of heavy alcohol abuse, but apparently had quit 5 years  ago; however the patient apparently had a recent drink.  The patient's  respiratory status deteriorated after he was admitted over the course of  the day today, requiring intubation at 3:00 p.m. this afternoon.  He has  subsequently been agitated and required 4 mg of IV Ativan for sedation.  He has had no further witnessed seizures while he has been in the  hospital.   His Dilantin level on admission was found to be toxic at 36.6, with  albumin level of 4.1.  Valproic acid level was less than 10 and  phenobarbital level was less than 5.  He also had a spinal tap, which  showed increased glucose of 89 and normal protein of 30, with noted  white cells.  The patient has also subsequently been found to have high  fever of 105 degrees  centigrade, for which he has been started on Zosyn.   PAST MEDICAL HISTORY:  Significant for:  1. Mental retardation.  2. Speech impediment.  3. Seizure disorder.  4. Psychotic disorder.  5. Alcohol abuse.  6. Hearing loss.   HOME MEDICATIONS:  1. Dilantin 100 mg in the morning and 200 mg at night  2. Ativan.  3. Trazodone.  4. Zyprexa.  5. Carbamazepine 400 mg p.o. daily.  6. Phenobarbital dose unknown.   SOCIAL HISTORY:  The patient is single.  He apparently lives with his  sister.  He is currently not smoking.  He has a history of alcoholism.   PHYSICAL EXAMINATION:  Reveals middle-aged [place] American gentleman  who is intubated.  He is restless.  He is moving all 4 extremities.  He  is in restraints, despite the sedation.  VITAL SIGNS:  Pulse rate 101 per minute and regular sinus, blood  pressure 102/59, respiratory rate 18 per minute.  Distal  pulses well  felt  HEENT:  Head is nontraumatic.  NECK:  Supple.  There is no stiffness.  CARDIAC EXAM:  Regular heart sounds.  No murmur or gallop.  LUNGS:  Clear to auscultation.  ABDOMEN:  Nontender.  NEUROLOGIC:  The patient is sedated, but he does open eyes to touch.  He  does not follow any commands.  He has some spontaneous eye movements and  doll's eye movements are sluggish.  Pupils are 2 mm and sluggishly  reactive.  He has a cough and gag.  He moves all 4 extremities  spontaneously to touch and painful stimuli against gravity equally.  Reflexes are 2+ symmetric.  Plantars were downgoing.   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  A small 2 cm left frontal possible meningioma has  been reported by the radiologist; but I cannot see this clearly.  Dilantin level today is 36.6.  Albumin level 4.  CSF glucose 89.  Protein 30.  Urine osmolality 279, hemoglobin A1c 4.9.  CSF cell count  is 2 wbc only.  Protein  is 30 and glucose 89.  UA is negative.  Urine  drug screen is positive only for benzodiazepines.   IMPRESSION:  A 70 year old gentleman with altered mental status.  This  is likely multifactorial due to his postictal state, as well as Dilantin  toxicity and high fever and possible infection.   PLAN:  I would hold Dilantin and consider changing to alternative  anticonvulsants, since if he has had a seizure despite high Dilantin  levels is clearly faded or he is toxic and his maintenance dose does not  seem to be too high.  I would change him to Keppra 1 gram IV loading  dose now, followed by 500 twice a day.  Check carbamazepine level, since  I found that listed in one of his medications; and confirm this if he is  really taking it.  He needs to go back on his prior maintenance dose.   I expect the patient to improve gradually over the next couple of days.  Extubate as tolerated.  If there are any focal neurological deficits,  may consider MRI in the future -- but not now.  Consider alcohol  withdrawal precautions if the patient has restarted drinking.  Kindly  call for questions.           ______________________________  Sunny Schlein. Pearlean Brownie, MD     PPS/MEDQ  D:  04/05/2007  T:  04/08/2007  Job:  [id number]

## 2010-07-05 NOTE — Discharge Summary (Signed)
NAME:  Wesley Sullivan, Wesley Sullivan NO.:  [id number]   MEDICAL RECORD NO.:  [id number]          PATIENT TYPE:  INP   LOCATION:  1435                         FACILITY:  [facility]   PHYSICIAN:  Theodosia Paling, MD    DATE OF BIRTH:  08-Jul-1940   DATE OF ADMISSION:  01/30/2008  DATE OF DISCHARGE:                               DISCHARGE SUMMARY   ADDENDUM TO DISCHARGE SUMMARY:  Please refer to the entire discharge  summary dictated by Dr. Marthann Schiller February 12, 2008, for  discharge diagnosis, hospital course and consultation performed and then  my discharge summary dictated on February 17, 2008.  In addition to all  above, please note below.   DISCHARGE DIAGNOSES:  1. Delirium on top of dementia, resolved at the time of discharge.  2. Small bowel obstruction status post exploratory laparotomy and      sigmoid colectomy with gastrostomy tube and small nodule biopsy.  3. Aspiration pneumonia.  4. GI bleed.  5. Cerebral palsy.  6. Mental retardation.  7. Acute renal failure which resolved.   DISCHARGE MEDICATIONS:  1. Albuterol 2.5 mg nebs q. 6 hours as needed.  2. Tegretol 200 mg via PEG tube q.12 h.  3. Lovenox 40 units subcu q. daily for DVT prophylaxis.  4. Duragesic patch 25 mcg q.72 h.  5. Free water 120 ml q.6 hours.  6. Atrovent q.6 hours for p.r.n. for shortness of breath.  7. Jevity 1.2 calorie nutritional supplement as recommended by      dietary.  8. Keppra 500 mg via PEG tube q.12 h.  9. Protonix 40 mg via PEG tube q.12 h.  10.Ambien 5 mg q. via PEG tube q.h.s. p.r.n.  11.Reglan 10 mg via PEG tube every 12 hours as needed for nausea.   Total time spent in discharge of the patient around 45 minutes.      Theodosia Paling, MD  Electronically Signed     NP/MEDQ  D:  02/18/2008  T:  02/18/2008  Job:  [id number]   cc:   [facility]

## 2010-07-05 NOTE — H&P (Signed)
NAME:  NICKI, GRACY             ACCOUNT NO.:  [id number]   MEDICAL RECORD NO.:  [id number]          PATIENT TYPE:  INP   LOCATION:  3311                         FACILITY:  [facility]   PHYSICIAN:  Jackie Plum, M.D.DATE OF BIRTH:  [birth year]   DATE OF ADMISSION:  09/25/2008  DATE OF DISCHARGE:                              HISTORY & PHYSICAL   PRIMARY CARE PHYSICIAN:  Fleet Contras, M.D.   CHIEF COMPLAINT:  Status epilepticus.   HISTORY OF PRESENT ILLNESS:  The patient is unable to give history.  History was from review of the patient's clinical database as well as ED  records.  He is a 70 year old African American gentleman who was brought  in by EMS on account of multiple seizure activities experienced at home  reportedly by the family.  Seizures were said to be grand mal, tonic-  clonic seizure lasting several minutes.  The patient was said to have  walked into the exam room under his own efforts according to the  records.  At the emergency room, the patient reportedly had an episode  of seizure activity.  CT scan of the head, which was negative.  The  patient has therefore been admitted for further management in this  regard.   PAST MEDICAL HISTORY:  Possible history of cerebral palsy with mental  retardation, renal failure, DVT, dementia, diabetes mellitus, anemia of  chronic disease, delirium of agitation, retropharyngeal abscess, status  post vent for respiratory failure. Marland Kitchen   PAST SURGICAL HISTORY:  The patient is status post PEG placement.   ALLERGIES:  No medication allergies.   CURRENT MEDICATIONS:  The patient's current medicines such include  fentanyl, Keppra, carbamazepine, omeprazole, and vitamin C.  Medications  dosages are unclear at this time; however, the patient was discharged on  February 18, 2008 from the hospital on:  1. Tegretol 200 mg q.12.  2. Duragesic patch 25 mcg q.72 h.  3. Keppra 500 mg q.12.  4. Protonix 40 mg q.12.  5. Ambien 5 mg at  bedtime p.r.n.  6. Reglan 10 mg q.12 p.r.n.  7. He was also on albuterol nebulizers at that time.   FAMILY HISTORY:  York Spaniel to be noncontributory from previous hospital  records.   SOCIAL HISTORY:  The patient is said to be single and was living with  his sister as documented per request in December 2009.  At that time, he  was said to be having problems with stuttering and there was difficulty  understanding him.   REVIEW OF SYSTEMS:  Unobtainable due to the patient's clinical status.   PHYSICAL EXAMINATION:  VITAL SIGNS:  BP 141/85, pulse 88, respirations  19, and temp 98.6 degrees Fahrenheit.  GENERAL:  The patient was not in acute cardiopulmonary distress.  He was  lethargic but arousable, status post sedation.  He could not give any  specific history, he was talking out of his head.  He seemed to move his  extremities.  There was no evidence of hypotonia or hypertonia noted.  HEENT:  Normocephalic and atraumatic.  Pupils were equal, round,  reactive to light.  Extraocular movements intact. Oropharynx  moist.  Sclerae, pallor present.  NECK:  Supple.  No JVD.  LUNGS:  Clear to auscultation.  CARDIAC:  No gallops.  ABDOMEN:  Soft.  Bowel sounds present.  EXTREMITIES:  No cyanosis.   LABORATORY DATA:  Reviewed with a white count 12.2, hemoglobin 12.6,  hematocrit 37.0, and platelet count 154.  Glucose 149 on fingerstick.  Sodium 139, potassium 3.5, chloride 103, CO2 28, glucose 151, BUN 11,  and creatinine 0.83.  Carbamazepine level less than 2.0.  UA was  negative for UTI.  X-ray of the chest noted low lung volumes, no acute  infiltrate.  Head CT without contrast reported chronic right frontal  encephalomalacia consistent with old head trauma versus old infarction,  no acute or vestibule process noted.   ASSESSMENT AND PLAN:  A 70 year old African American gentleman with  history of seizures and mental retardation, presenting with recurrent  seizure activity at home and other  activity while he is in the hospital.  The patient is admitted for management of status epilepticus.  His  carbamazepine level is subtherapeutic.  The patient will be started on  Dilantin; it is not clear whether he is still on carbamazepine or not.  We will plan supportive care at this time.  The patient had low-grade  fever in the ER as well as leukocytosis.  The fever was thought to be  due to his seizure activity and I believe that leukocytosis is also due  to stress margination related to his seizure.  There is no focus of  infection on exam at this time.  We will hold off antibiotics for now  and monitor him clinically.      Jackie Plum, M.D.  Electronically Signed     GO/MEDQ  D:  09/25/2008  T:  09/26/2008  Job:  [id number]   cc:   Fleet Contras, M.D.

## 2010-07-05 NOTE — Procedures (Signed)
EEG NUMBER:  [id number].   HISTORY:  This 70 year old patient who is being evaluated for problems  with seizures and confusion, the patient has a history of psychotic  disorder, alcohol abuse.  This is a portable EEG recording.  No skull  defects noted.   MEDICATIONS:  Protonix, Lovenox, Ativan, Haldol, Keppra, Diprivan,  Unasyn, Dilantin.   EEG CLASSIFICATION:  Dysrhythmia grade 1 generalized.   DESCRIPTION RECORDING:  The background rhythm this recording consists of  a poorly modulated, medium amplitude rhythm of 6 Hz that is reactive to  eye pin  closure.  As the record progresses, the recording is filled  with muscle artifact and head movement artifact making the entire  recording technically difficult to interpret.  Photic stimulation and  hyperventilation was never performed at no time during the recording as  there appeared to be evidence of spike, spike wave discharges, or  evidence of focal slowing.  EKG monitor shows no evidence of cardiac  rhythm abnormalities with a heart rate of 66.   IMPRESSION:  This is an abnormal electroencephalogram recording due to  generalized slowing.  Such recording is nonspecific and can be seen with  any process results in underlying toxic or metabolic encephalopathy or  any dementing illness.  No epileptiform discharges were seen.  The study  was technically difficult due to excessive head movement artifact.      Marlan Palau, M.D.  Electronically Signed     ZOX:WRUE  D:  04/09/2007 16:56:39  T:  04/10/2007 14:17:40  Job #:  [id number]

## 2010-08-25 ENCOUNTER — Emergency Department: Payer: Self-pay | Admitting: Emergency Medicine

## 2010-11-11 LAB — GRAM STAIN

## 2010-11-11 LAB — COMPREHENSIVE METABOLIC PANEL
ALT: 16
ALT: 19
ALT: 27
AST: 37
AST: 16
AST: 32
Albumin: 1.6 — ABNORMAL LOW
Albumin: 1.8 — ABNORMAL LOW
Albumin: 2 — ABNORMAL LOW
Albumin: 2 — ABNORMAL LOW
Albumin: 2.4 — ABNORMAL LOW
Alkaline Phosphatase: 30 — ABNORMAL LOW
Alkaline Phosphatase: 38 — ABNORMAL LOW
Alkaline Phosphatase: 44
Alkaline Phosphatase: 49
Alkaline Phosphatase: 53
Alkaline Phosphatase: 61
Alkaline Phosphatase: 62
Alkaline Phosphatase: 78
BUN: 14
BUN: 12
BUN: 12
BUN: 16
BUN: 16
BUN: 19
BUN: 6
CO2: 26
CO2: 21
CO2: 28
CO2: 31
Calcium: 8.9
Calcium: 7.3 — ABNORMAL LOW
Calcium: 7.5 — ABNORMAL LOW
Calcium: 7.6 — ABNORMAL LOW
Calcium: 7.8 — ABNORMAL LOW
Calcium: 7.8 — ABNORMAL LOW
Chloride: 99
Chloride: 106
Chloride: 107
Chloride: 108
Chloride: 112
Chloride: 116 — ABNORMAL HIGH
Creatinine, Ser: 0.95
Creatinine, Ser: 0.71
Creatinine, Ser: 0.73
Creatinine, Ser: 0.82
Creatinine, Ser: 0.92
GFR calc Af Amer: >60
GFR calc Af Amer: >60
GFR calc Af Amer: >60
GFR calc non Af Amer: >60
GFR calc non Af Amer: >60
GFR calc non Af Amer: >60
GFR calc non Af Amer: >60
Glucose, Bld: 109 — ABNORMAL HIGH
Glucose, Bld: 116 — ABNORMAL HIGH
Glucose, Bld: 130 — ABNORMAL HIGH
Glucose, Bld: 132 — ABNORMAL HIGH
Glucose, Bld: 60 — ABNORMAL LOW
Glucose, Bld: 64 — ABNORMAL LOW
Potassium: 3.2 — ABNORMAL LOW
Potassium: 3.3 — ABNORMAL LOW
Potassium: 3.3 — ABNORMAL LOW
Potassium: 4.1
Potassium: 4.1
Potassium: 4.2
Potassium: 4.2
Sodium: 130 — ABNORMAL LOW
Sodium: 143
Sodium: 144
Total Bilirubin: 1.3 — ABNORMAL HIGH
Total Bilirubin: 0.7
Total Bilirubin: 0.8
Total Bilirubin: 0.9
Total Bilirubin: 1.1
Total Protein: 4.2 — ABNORMAL LOW
Total Protein: 4.5 — ABNORMAL LOW
Total Protein: 5 — ABNORMAL LOW
Total Protein: 5.2 — ABNORMAL LOW
Total Protein: 6

## 2010-11-11 LAB — CBC
HCT: 39
HCT: 21.2 — ABNORMAL LOW
HCT: 22.4 — ABNORMAL LOW
HCT: 22.5 — ABNORMAL LOW
HCT: 24.2 — ABNORMAL LOW
HCT: 25.1 — ABNORMAL LOW
HCT: 25.4 — ABNORMAL LOW
HCT: 25.9 — ABNORMAL LOW
HCT: 26.3 — ABNORMAL LOW
HCT: 27.9 — ABNORMAL LOW
HCT: 28.5 — ABNORMAL LOW
HCT: 29.3 — ABNORMAL LOW
HCT: 29.7 — ABNORMAL LOW
Hemoglobin: 10 — ABNORMAL LOW
Hemoglobin: 6.2 — CL
Hemoglobin: 7.6 — CL
Hemoglobin: 7.7 — CL
Hemoglobin: 7.7 — CL
Hemoglobin: 8.3 — ABNORMAL LOW
Hemoglobin: 8.6 — ABNORMAL LOW
Hemoglobin: 8.7 — ABNORMAL LOW
Hemoglobin: 8.7 — ABNORMAL LOW
Hemoglobin: 8.9 — ABNORMAL LOW
Hemoglobin: 8.9 — ABNORMAL LOW
Hemoglobin: 9.6 — ABNORMAL LOW
MCHC: 34.1
MCHC: 33.8
MCHC: 34
MCHC: 34
MCHC: 34.2
MCHC: 34.3
MCHC: 34.3
MCHC: 34.3
MCHC: 34.5
MCHC: 34.5
MCHC: 34.7
MCV: 91
MCV: 90.6
MCV: 90.8
MCV: 91.2
MCV: 91.3
MCV: 91.3
MCV: 91.4
MCV: 91.5
MCV: 91.8
MCV: 91.8
MCV: 92.4
MCV: 92.8
MCV: 93.4
Platelets: 162
Platelets: 253
Platelets: 254
Platelets: 288
Platelets: 350
Platelets: 352
Platelets: 377
Platelets: 67 — ABNORMAL LOW
Platelets: 70 — ABNORMAL LOW
Platelets: 91 — ABNORMAL LOW
Platelets: 93 — ABNORMAL LOW
RBC: 4.28
RBC: 1.96 — ABNORMAL LOW
RBC: 2.33 — ABNORMAL LOW
RBC: 2.44 — ABNORMAL LOW
RBC: 2.45 — ABNORMAL LOW
RBC: 2.59 — ABNORMAL LOW
RBC: 2.72 — ABNORMAL LOW
RBC: 2.72 — ABNORMAL LOW
RBC: 2.8 — ABNORMAL LOW
RBC: 3.05 — ABNORMAL LOW
RBC: 3.21 — ABNORMAL LOW
RBC: 3.48 — ABNORMAL LOW
RDW: 12.8
RDW: 12.9
RDW: 12.9
RDW: 12.9
RDW: 13.2
RDW: 13.3
RDW: 13.6
RDW: 13.6
RDW: 13.7
RDW: 13.7
RDW: 13.7
RDW: 13.7
WBC: 24.2 — ABNORMAL HIGH
WBC: 10.3
WBC: 12 — ABNORMAL HIGH
WBC: 12.8 — ABNORMAL HIGH
WBC: 13.4 — ABNORMAL HIGH
WBC: 13.9 — ABNORMAL HIGH
WBC: 14.4 — ABNORMAL HIGH
WBC: 14.6 — ABNORMAL HIGH
WBC: 19.5 — ABNORMAL HIGH
WBC: 9.4

## 2010-11-11 LAB — DIFFERENTIAL
Basophils Absolute: 0
Basophils Absolute: 0
Basophils Absolute: 0
Basophils Relative: 0
Lymphocytes Relative: 1 — ABNORMAL LOW
Lymphocytes Relative: 2 — ABNORMAL LOW
Lymphs Abs: 0.3 — ABNORMAL LOW
Monocytes Absolute: 1
Monocytes Absolute: 2 — ABNORMAL HIGH
Neutro Abs: 22.6 — ABNORMAL HIGH
Neutro Abs: 15.9 — ABNORMAL HIGH
Neutro Abs: 16.5 — ABNORMAL HIGH
Neutrophils Relative %: 94 — ABNORMAL HIGH
Neutrophils Relative %: 84 — ABNORMAL HIGH

## 2010-11-11 LAB — POCT I-STAT 3, ART BLOOD GAS (G3+)
Acid-Base Excess: 5 — ABNORMAL HIGH
Acid-base deficit: 2
Acid-base deficit: 3 — ABNORMAL HIGH
Acid-base deficit: 3 — ABNORMAL HIGH
Acid-base deficit: 3 — ABNORMAL HIGH
Acid-base deficit: 7 — ABNORMAL HIGH
Bicarbonate: 22.3
Bicarbonate: 18.5 — ABNORMAL LOW
Bicarbonate: 19.9 — ABNORMAL LOW
Bicarbonate: 22.5
Bicarbonate: 22.6
Bicarbonate: 28.7 — ABNORMAL HIGH
O2 Saturation: 99
O2 Saturation: 100
O2 Saturation: 100
O2 Saturation: 100
O2 Saturation: 99
O2 Saturation: 99
Operator id: 114551
Operator id: 276051
Operator id: 280981
Operator id: 280981
Operator id: 282221
Operator id: 282221
Operator id: 282221
Operator id: 299371
Patient temperature: 37
Patient temperature: 98.6
Patient temperature: 98.6
Patient temperature: 99.3
Patient temperature: 99.6
TCO2: 23
TCO2: 20
TCO2: 21
TCO2: 24
TCO2: 24
TCO2: 28
TCO2: 30
pCO2 arterial: 30.4 — ABNORMAL LOW
pCO2 arterial: 37.1
pCO2 arterial: 38.1
pCO2 arterial: 38.9
pCO2 arterial: 39.6
pCO2 arterial: 42.6
pH, Arterial: 7.473 — ABNORMAL HIGH
pH, Arterial: 7.308 — ABNORMAL LOW
pH, Arterial: 7.32 — ABNORMAL LOW
pH, Arterial: 7.364
pH, Arterial: 7.379
pH, Arterial: 7.496 — ABNORMAL HIGH
pO2, Arterial: 139 — ABNORMAL HIGH
pO2, Arterial: 102 — ABNORMAL HIGH
pO2, Arterial: 127 — ABNORMAL HIGH
pO2, Arterial: 146 — ABNORMAL HIGH
pO2, Arterial: 487 — ABNORMAL HIGH
pO2, Arterial: 491 — ABNORMAL HIGH
pO2, Arterial: 84

## 2010-11-11 LAB — URINE CULTURE
Colony Count: NO GROWTH
Culture: NO GROWTH

## 2010-11-11 LAB — BASIC METABOLIC PANEL
BUN: 10
BUN: 12
BUN: 13
CO2: 27
CO2: 28
CO2: 30
Calcium: 7.5 — ABNORMAL LOW
Calcium: 7.5 — ABNORMAL LOW
Calcium: 7.6 — ABNORMAL LOW
Calcium: 7.7 — ABNORMAL LOW
Chloride: 103
Chloride: 105
Chloride: 106
Creatinine, Ser: 0.65
Creatinine, Ser: 0.77
Creatinine, Ser: 0.8
Creatinine, Ser: 0.8
Creatinine, Ser: 0.97
GFR calc Af Amer: >60
GFR calc Af Amer: >60
GFR calc Af Amer: >60
GFR calc Af Amer: >60
GFR calc non Af Amer: >60
GFR calc non Af Amer: >60
GFR calc non Af Amer: >60
GFR calc non Af Amer: >60
Glucose, Bld: 124 — ABNORMAL HIGH
Potassium: 3.2 — ABNORMAL LOW
Potassium: 3.5
Potassium: 4.5
Sodium: 132 — ABNORMAL LOW
Sodium: 136
Sodium: 140
Sodium: 143

## 2010-11-11 LAB — URINALYSIS, ROUTINE W REFLEX MICROSCOPIC
Bilirubin Urine: NEGATIVE
Glucose, UA: NEGATIVE
Ketones, ur: NEGATIVE
Nitrite: NEGATIVE
Protein, ur: NEGATIVE
Specific Gravity, Urine: 1.021
Specific Gravity, Urine: 1.009
Urobilinogen, UA: 1
Urobilinogen, UA: 1

## 2010-11-11 LAB — TYPE AND SCREEN
ABO/RH(D): A POS
Antibody Screen: NEGATIVE

## 2010-11-11 LAB — VANCOMYCIN, TROUGH
Vancomycin Tr: 18.7
Vancomycin Tr: 8.9

## 2010-11-11 LAB — CULTURE, RESPIRATORY W GRAM STAIN

## 2010-11-11 LAB — CULTURE, BLOOD (ROUTINE X 2)
Culture: NO GROWTH
Culture: NO GROWTH

## 2010-11-11 LAB — CARDIAC PANEL(CRET KIN+CKTOT+MB+TROPI)
CK, MB: 4.4 — ABNORMAL HIGH
CK, MB: 1.7
CK, MB: 7 — ABNORMAL HIGH
Relative Index: 0.8
Relative Index: 1.3
Relative Index: 1.7
Total CK: 560 — ABNORMAL HIGH
Troponin I: 0.02
Troponin I: 0.04

## 2010-11-11 LAB — PROTIME-INR
INR: 1.2
INR: 1.3
Prothrombin Time: 18 — ABNORMAL HIGH
Prothrombin Time: 15.1
Prothrombin Time: 15.1
Prothrombin Time: 15.4 — ABNORMAL HIGH
Prothrombin Time: 16 — ABNORMAL HIGH

## 2010-11-11 LAB — IRON AND TIBC: UIBC: 123

## 2010-11-11 LAB — ANAEROBIC CULTURE

## 2010-11-11 LAB — CROSSMATCH
ABO/RH(D): A POS
Antibody Screen: NEGATIVE

## 2010-11-11 LAB — FUNGUS CULTURE W SMEAR: Fungal Smear: NONE SEEN

## 2010-11-11 LAB — ACETAMINOPHEN LEVEL: Acetaminophen (Tylenol), Serum: <10 — ABNORMAL LOW

## 2010-11-11 LAB — HAPTOGLOBIN: Haptoglobin: 146

## 2010-11-11 LAB — FOLATE RBC: RBC Folate: 475

## 2010-11-11 LAB — CSF CELL COUNT WITH DIFFERENTIAL

## 2010-11-11 LAB — MAGNESIUM
Magnesium: 1.8
Magnesium: 2.3
Magnesium: 2.3
Magnesium: 2.3
Magnesium: 2.4
Magnesium: 2.6 — ABNORMAL HIGH

## 2010-11-11 LAB — PHOSPHORUS
Phosphorus: 1.9 — ABNORMAL LOW
Phosphorus: 3
Phosphorus: 3.3
Phosphorus: 3.6

## 2010-11-11 LAB — RETICULOCYTES
RBC.: 2.37 — ABNORMAL LOW
Retic Count, Absolute: 21.3
Retic Ct Pct: 0.9

## 2010-11-11 LAB — SALICYLATE LEVEL: Salicylate Lvl: <4

## 2010-11-11 LAB — TRIGLYCERIDES: Triglycerides: 85

## 2010-11-11 LAB — APTT
aPTT: 32
aPTT: 34
aPTT: 38 — ABNORMAL HIGH
aPTT: 39 — ABNORMAL HIGH

## 2010-11-11 LAB — CARBOXYHEMOGLOBIN
Carboxyhemoglobin: 1.2
Carboxyhemoglobin: 1.3
Methemoglobin: 0.5
Methemoglobin: 1.2
O2 Saturation: 62.3
O2 Saturation: 79.2
O2 Saturation: 82.4
Total hemoglobin: 13.3 — ABNORMAL LOW
Total hemoglobin: 8.8 — ABNORMAL LOW

## 2010-11-11 LAB — PROTEIN AND GLUCOSE, CSF
Glucose, CSF: 89 — ABNORMAL HIGH
Total  Protein, CSF: 30

## 2010-11-11 LAB — CULTURE, ROUTINE-ABSCESS: Culture: NO GROWTH

## 2010-11-11 LAB — PHENYTOIN LEVEL, TOTAL
Phenytoin Lvl: 10.3
Phenytoin Lvl: 11.1
Phenytoin Lvl: 13.1
Phenytoin Lvl: <2.5 — ABNORMAL LOW

## 2010-11-11 LAB — CK TOTAL AND CKMB (NOT AT ARMC)
CK, MB: 5.8 — ABNORMAL HIGH
Relative Index: 0.5
Total CK: 1063 — ABNORMAL HIGH

## 2010-11-11 LAB — URINALYSIS, DIPSTICK ONLY
Bilirubin Urine: NEGATIVE
Ketones, ur: NEGATIVE
Nitrite: NEGATIVE
Protein, ur: NEGATIVE
pH: 8

## 2010-11-11 LAB — HEMOGLOBIN A1C
Hgb A1c MFr Bld: 4.9
Mean Plasma Glucose: 97

## 2010-11-11 LAB — CULTURE, BAL-QUANTITATIVE W GRAM STAIN: Colony Count: NO GROWTH

## 2010-11-11 LAB — LACTIC ACID, PLASMA: Lactic Acid, Venous: 2.3 — ABNORMAL HIGH

## 2010-11-11 LAB — HEPARIN ANTIBODY SCREEN: Heparin Antibody Screen: NEGATIVE

## 2010-11-11 LAB — VITAMIN B12: Vitamin B-12: 921 — ABNORMAL HIGH (ref 211–911)

## 2010-11-11 LAB — URINE MICROSCOPIC-ADD ON

## 2010-11-11 LAB — CSF CULTURE W GRAM STAIN: Culture: NO GROWTH

## 2010-11-11 LAB — PREALBUMIN
Prealbumin: 5.5 — ABNORMAL LOW
Prealbumin: 8.7 — ABNORMAL LOW

## 2010-11-11 LAB — DIC (DISSEMINATED INTRAVASCULAR COAGULATION)PANEL
D-Dimer, Quant: 4.83 — ABNORMAL HIGH
Fibrinogen: 606 — ABNORMAL HIGH
Prothrombin Time: 23.5 — ABNORMAL HIGH
aPTT: 40 — ABNORMAL HIGH

## 2010-11-11 LAB — LEGIONELLA ANTIGEN, URINE: Legionella Antigen, Urine: NEGATIVE

## 2010-11-11 LAB — LACTATE DEHYDROGENASE: LDH: 381 — ABNORMAL HIGH

## 2010-11-11 LAB — B-NATRIURETIC PEPTIDE (CONVERTED LAB)
Pro B Natriuretic peptide (BNP): 188 — ABNORMAL HIGH
Pro B Natriuretic peptide (BNP): 62

## 2010-11-11 LAB — TROPONIN I: Troponin I: 0.03

## 2010-11-11 LAB — OSMOLALITY: Osmolality: 324 — ABNORMAL HIGH

## 2010-11-11 LAB — RAPID URINE DRUG SCREEN, HOSP PERFORMED: Barbiturates: NOT DETECTED

## 2010-11-11 LAB — D-DIMER, QUANTITATIVE: D-Dimer, Quant: 3.39 — ABNORMAL HIGH

## 2010-11-14 LAB — CBC
HCT: 25.1 — ABNORMAL LOW
HCT: 27.2 — ABNORMAL LOW
HCT: 27.7 — ABNORMAL LOW
HCT: 28.2 — ABNORMAL LOW
HCT: 29.2 — ABNORMAL LOW
HCT: 32.4 — ABNORMAL LOW
HCT: 25.4 — ABNORMAL LOW
HCT: 26.4 — ABNORMAL LOW
HCT: 27.4 — ABNORMAL LOW
HCT: 28.2 — ABNORMAL LOW
HCT: 28.4 — ABNORMAL LOW
HCT: 28.5 — ABNORMAL LOW
HCT: 28.5 — ABNORMAL LOW
HCT: 28.6 — ABNORMAL LOW
HCT: 29 — ABNORMAL LOW
HCT: 31.6 — ABNORMAL LOW
Hemoglobin: 10.9 — ABNORMAL LOW
Hemoglobin: 11 — ABNORMAL LOW
Hemoglobin: 8.6 — ABNORMAL LOW
Hemoglobin: 9.2 — ABNORMAL LOW
Hemoglobin: 9.4 — ABNORMAL LOW
Hemoglobin: 8.5 — ABNORMAL LOW
Hemoglobin: 8.9 — ABNORMAL LOW
Hemoglobin: 9.2 — ABNORMAL LOW
Hemoglobin: 9.2 — ABNORMAL LOW
Hemoglobin: 9.8 — ABNORMAL LOW
MCHC: 33.8
MCHC: 33.8
MCHC: 34
MCHC: 34.3
MCHC: 34.5
MCHC: 33.5
MCHC: 33.6
MCHC: 33.7
MCHC: 34.2
MCHC: 34.3
MCV: 89.9
MCV: 90.1
MCV: 90.4
MCV: 90.5
MCV: 90.8
MCV: 91
MCV: 92.6
MCV: 93.1
MCV: 93.3
MCV: 93.5
MCV: 93.5
Platelets: 268
Platelets: 297
Platelets: 327
Platelets: 330
Platelets: 165
Platelets: 167
Platelets: 171
Platelets: 172
Platelets: 184
Platelets: 214
Platelets: 340
RBC: 3.04 — ABNORMAL LOW
RBC: 3.1 — ABNORMAL LOW
RBC: 3.54 — ABNORMAL LOW
RBC: 3.59 — ABNORMAL LOW
RBC: 2.94 — ABNORMAL LOW
RBC: 3.02 — ABNORMAL LOW
RBC: 3.03 — ABNORMAL LOW
RBC: 3.06 — ABNORMAL LOW
RDW: 12.6
RDW: 13
RDW: 13.1
RDW: 13.2
RDW: 14.4
RDW: 14.7
RDW: 14.8
RDW: 14.9
RDW: 15.4
RDW: 15.6 — ABNORMAL HIGH
WBC: 10.6 — ABNORMAL HIGH
WBC: 11.5 — ABNORMAL HIGH
WBC: 13.8 — ABNORMAL HIGH
WBC: 15.1 — ABNORMAL HIGH
WBC: 20.8 — ABNORMAL HIGH
WBC: 9.9
WBC: 10.1
WBC: 10.9 — ABNORMAL HIGH
WBC: 4.1
WBC: 4.2
WBC: 6.2
WBC: 6.4
WBC: 7.1

## 2010-11-14 LAB — BASIC METABOLIC PANEL
BUN: 12
BUN: 14
BUN: 18
BUN: 20
BUN: 22
CO2: 22
CO2: 25
CO2: 25
CO2: 28
CO2: 33 — ABNORMAL HIGH
Calcium: 7.9 — ABNORMAL LOW
Calcium: 8 — ABNORMAL LOW
Calcium: 8.1 — ABNORMAL LOW
Calcium: 8.6
Calcium: 8.7
Chloride: 100
Chloride: 102
Chloride: 106
Chloride: 107
Chloride: 108
Chloride: 101
Chloride: 105
Creatinine, Ser: 0.54
Creatinine, Ser: 0.78
Creatinine, Ser: 0.57
GFR calc Af Amer: >60
GFR calc Af Amer: >60
GFR calc Af Amer: >60
GFR calc Af Amer: >60
GFR calc non Af Amer: >60
GFR calc non Af Amer: >60
GFR calc non Af Amer: >60
GFR calc non Af Amer: >60
GFR calc non Af Amer: >60
Glucose, Bld: 90
Glucose, Bld: 95
Glucose, Bld: 90
Potassium: 3.7
Potassium: 3.7
Potassium: 4.1
Potassium: 4.3
Potassium: 4.6
Potassium: 4.8
Potassium: 3.9
Potassium: 4
Potassium: 4.3
Sodium: 136
Sodium: 136
Sodium: 137
Sodium: 137
Sodium: 138
Sodium: 138
Sodium: 138

## 2010-11-14 LAB — COMPREHENSIVE METABOLIC PANEL
ALT: 21
AST: 105 — ABNORMAL HIGH
AST: 25
AST: 30
AST: 34
AST: 62 — ABNORMAL HIGH
AST: 20
Albumin: 1.8 — ABNORMAL LOW
Albumin: 1.8 — ABNORMAL LOW
Albumin: 2 — ABNORMAL LOW
Albumin: 2.2 — ABNORMAL LOW
Albumin: 2.1 — ABNORMAL LOW
Alkaline Phosphatase: 107
Alkaline Phosphatase: 71
Alkaline Phosphatase: 74
Alkaline Phosphatase: 134 — ABNORMAL HIGH
BUN: 10
BUN: 10
BUN: 13
BUN: 14
BUN: 10
BUN: 19
CO2: 23
CO2: 29
CO2: 31
Calcium: 8.1 — ABNORMAL LOW
Calcium: 8.2 — ABNORMAL LOW
Calcium: 8.4
Calcium: 8.6
Calcium: 8.1 — ABNORMAL LOW
Calcium: 8.9
Chloride: 104
Chloride: 105
Chloride: 106
Chloride: 110
Chloride: 102
Creatinine, Ser: 0.61
Creatinine, Ser: 0.69
Creatinine, Ser: 0.45
GFR calc Af Amer: >60
GFR calc Af Amer: >60
GFR calc Af Amer: >60
GFR calc Af Amer: >60
GFR calc Af Amer: >60
GFR calc Af Amer: >60
GFR calc non Af Amer: >60
GFR calc non Af Amer: >60
GFR calc non Af Amer: >60
GFR calc non Af Amer: >60
Glucose, Bld: 62 — ABNORMAL LOW
Glucose, Bld: 149 — ABNORMAL HIGH
Glucose, Bld: 89
Potassium: 3.8
Potassium: 4.4
Potassium: 3.8
Sodium: 141
Total Bilirubin: 0.3
Total Bilirubin: 0.3
Total Bilirubin: 0.3
Total Protein: 6.2
Total Protein: 6.5
Total Protein: 6.7
Total Protein: 5.9 — ABNORMAL LOW
Total Protein: 6.5

## 2010-11-14 LAB — POCT I-STAT 3, ART BLOOD GAS (G3+)
pCO2 arterial: 30.6 — ABNORMAL LOW
pH, Arterial: 7.553 — ABNORMAL HIGH
pO2, Arterial: 58 — ABNORMAL LOW

## 2010-11-14 LAB — TRIGLYCERIDES
Triglycerides: 58
Triglycerides: 68
Triglycerides: 71
Triglycerides: 81
Triglycerides: 91
Triglycerides: 37
Triglycerides: 51
Triglycerides: 51

## 2010-11-14 LAB — DIFFERENTIAL
Basophils Absolute: 0.1
Basophils Absolute: 0.1
Basophils Absolute: 0.1
Basophils Absolute: 0
Basophils Relative: 0
Basophils Relative: 0
Eosinophils Absolute: 0
Eosinophils Absolute: 0
Eosinophils Absolute: 0
Eosinophils Relative: 0
Eosinophils Relative: 0
Eosinophils Relative: 0
Eosinophils Relative: 0
Eosinophils Relative: 0
Lymphocytes Relative: 7 — ABNORMAL LOW
Lymphocytes Relative: 9 — ABNORMAL LOW
Lymphocytes Relative: 21
Lymphocytes Relative: 32
Lymphs Abs: 0.6 — ABNORMAL LOW
Lymphs Abs: 0.6 — ABNORMAL LOW
Lymphs Abs: 0.8
Lymphs Abs: 0.8
Lymphs Abs: 1.3
Lymphs Abs: 1.3
Lymphs Abs: 1.7
Monocytes Absolute: 1.2 — ABNORMAL HIGH
Monocytes Relative: 15 — ABNORMAL HIGH
Monocytes Relative: 6
Monocytes Relative: 7
Monocytes Relative: 11
Monocytes Relative: 12
Neutro Abs: 18.4 — ABNORMAL HIGH
Neutro Abs: 6.8
Neutro Abs: 7.8 — ABNORMAL HIGH
Neutro Abs: 9 — ABNORMAL HIGH
Neutro Abs: 5.7
Neutrophils Relative %: 78 — ABNORMAL HIGH
Neutrophils Relative %: 79 — ABNORMAL HIGH
Neutrophils Relative %: 53
Neutrophils Relative %: 65
Neutrophils Relative %: 67

## 2010-11-14 LAB — APTT: aPTT: 39 — ABNORMAL HIGH

## 2010-11-14 LAB — MAGNESIUM
Magnesium: 1.9
Magnesium: 2.2
Magnesium: 2.2
Magnesium: 2.1
Magnesium: 2.2
Magnesium: 2.3

## 2010-11-14 LAB — PHOSPHORUS
Phosphorus: 1.7 — ABNORMAL LOW
Phosphorus: 2.1 — ABNORMAL LOW
Phosphorus: 2.2 — ABNORMAL LOW
Phosphorus: 2.6
Phosphorus: 2.9
Phosphorus: 3
Phosphorus: 3.2

## 2010-11-14 LAB — URINE MICROSCOPIC-ADD ON

## 2010-11-14 LAB — URINALYSIS, ROUTINE W REFLEX MICROSCOPIC
Ketones, ur: NEGATIVE
Leukocytes, UA: NEGATIVE
Nitrite: NEGATIVE
Protein, ur: NEGATIVE
Urobilinogen, UA: 1
pH: 6.5

## 2010-11-14 LAB — CULTURE, BLOOD (ROUTINE X 2): Culture: NO GROWTH

## 2010-11-14 LAB — PHENYTOIN LEVEL, TOTAL
Phenytoin Lvl: 10
Phenytoin Lvl: 3.5 — ABNORMAL LOW
Phenytoin Lvl: 15.5
Phenytoin Lvl: 5.9 — ABNORMAL LOW

## 2010-11-14 LAB — PREALBUMIN
Prealbumin: 7.4 — ABNORMAL LOW
Prealbumin: 16.9 — ABNORMAL LOW
Prealbumin: 19.9

## 2010-11-14 LAB — CHOLESTEROL, TOTAL
Cholesterol: 113
Cholesterol: 83
Cholesterol: 88

## 2010-11-14 LAB — PROTIME-INR: Prothrombin Time: 14.8

## 2010-11-14 LAB — VANCOMYCIN, TROUGH: Vancomycin Tr: 19

## 2010-11-15 LAB — CBC
Hemoglobin: 9.1 — ABNORMAL LOW
MCHC: 33.7
MCV: 93.1
RBC: 2.89 — ABNORMAL LOW
RDW: 14.5

## 2010-11-25 LAB — COMPREHENSIVE METABOLIC PANEL
ALT: 21 U/L (ref 0–53)
ALT: 21 U/L (ref 0–53)
ALT: 26 U/L (ref 0–53)
AST: 24 U/L (ref 0–37)
AST: 26 U/L (ref 0–37)
Albumin: 2.4 g/dL — ABNORMAL LOW (ref 3.5–5.2)
Albumin: 2.4 g/dL — ABNORMAL LOW (ref 3.5–5.2)
Albumin: 2.7 g/dL — ABNORMAL LOW (ref 3.5–5.2)
Alkaline Phosphatase: 71 U/L (ref 39–117)
Alkaline Phosphatase: 74 U/L (ref 39–117)
BUN: 14 mg/dL (ref 6–23)
BUN: 18 mg/dL (ref 6–23)
CO2: 25 mEq/L (ref 19–32)
CO2: 27 mEq/L (ref 19–32)
CO2: 30 mEq/L (ref 19–32)
Calcium: 8 mg/dL — ABNORMAL LOW (ref 8.4–10.5)
Calcium: 8 mg/dL — ABNORMAL LOW (ref 8.4–10.5)
Calcium: 8.2 mg/dL — ABNORMAL LOW (ref 8.4–10.5)
Calcium: 8.4 mg/dL (ref 8.4–10.5)
Calcium: 8.5 mg/dL (ref 8.4–10.5)
Calcium: 8.6 mg/dL (ref 8.4–10.5)
Calcium: 8.7 mg/dL (ref 8.4–10.5)
Chloride: 105 mEq/L (ref 96–112)
Chloride: 107 mEq/L (ref 96–112)
Creatinine, Ser: 0.75 mg/dL (ref 0.4–1.5)
Creatinine, Ser: 0.76 mg/dL (ref 0.4–1.5)
Creatinine, Ser: 0.77 mg/dL (ref 0.4–1.5)
Creatinine, Ser: 0.82 mg/dL (ref 0.4–1.5)
Creatinine, Ser: 1.22 mg/dL (ref 0.4–1.5)
GFR calc Af Amer: >60 mL/min (ref >60)
GFR calc Af Amer: >60 mL/min (ref >60)
GFR calc non Af Amer: 59 mL/min — ABNORMAL LOW (ref >60)
GFR calc non Af Amer: >60 mL/min (ref >60)
GFR calc non Af Amer: >60 mL/min (ref >60)
GFR calc non Af Amer: >60 mL/min (ref >60)
Glucose, Bld: 112 mg/dL — ABNORMAL HIGH (ref 70–99)
Glucose, Bld: 122 mg/dL — ABNORMAL HIGH (ref 70–99)
Glucose, Bld: 124 mg/dL — ABNORMAL HIGH (ref 70–99)
Glucose, Bld: 128 mg/dL — ABNORMAL HIGH (ref 70–99)
Potassium: 4 mEq/L (ref 3.5–5.1)
Sodium: 135 mEq/L (ref 135–145)
Sodium: 136 mEq/L (ref 135–145)
Sodium: 141 mEq/L (ref 135–145)
Total Bilirubin: 0.4 mg/dL (ref 0.3–1.2)
Total Bilirubin: 0.8 mg/dL (ref 0.3–1.2)
Total Bilirubin: 0.8 mg/dL (ref 0.3–1.2)
Total Protein: 5.5 g/dL — ABNORMAL LOW (ref 6.0–8.3)
Total Protein: 5.7 g/dL — ABNORMAL LOW (ref 6.0–8.3)
Total Protein: 6.7 g/dL (ref 6.0–8.3)
Total Protein: 6.9 g/dL (ref 6.0–8.3)

## 2010-11-25 LAB — POCT I-STAT, CHEM 8
Calcium, Ion: 1.09 mmol/L — ABNORMAL LOW (ref 1.12–1.32)
Creatinine, Ser: 0.9 mg/dL (ref 0.4–1.5)
Glucose, Bld: 178 mg/dL — ABNORMAL HIGH (ref 70–99)
Hemoglobin: 15.3 g/dL (ref 13.0–17.0)
TCO2: 29 mmol/L (ref 0–100)

## 2010-11-25 LAB — DIFFERENTIAL
Basophils Absolute: 0 10*3/uL (ref 0.0–0.1)
Basophils Absolute: 0.4 10*3/uL — ABNORMAL HIGH (ref 0.0–0.1)
Basophils Absolute: 0.6 10*3/uL — ABNORMAL HIGH (ref 0.0–0.1)
Basophils Relative: 0 % (ref 0–1)
Basophils Relative: 0 % (ref 0–1)
Basophils Relative: 5 % — ABNORMAL HIGH (ref 0–1)
Eosinophils Absolute: 0 10*3/uL (ref 0.0–0.7)
Eosinophils Absolute: 0 10*3/uL (ref 0.0–0.7)
Eosinophils Absolute: 0 10*3/uL (ref 0.0–0.7)
Eosinophils Absolute: 0 10*3/uL (ref 0.0–0.7)
Eosinophils Absolute: 0 10*3/uL (ref 0.0–0.7)
Eosinophils Relative: 0 % (ref 0–5)
Eosinophils Relative: 0 % (ref 0–5)
Eosinophils Relative: 0 % (ref 0–5)
Eosinophils Relative: 0 % (ref 0–5)
Eosinophils Relative: 0 % (ref 0–5)
Lymphocytes Relative: 5 % — ABNORMAL LOW (ref 12–46)
Lymphocytes Relative: 5 % — ABNORMAL LOW (ref 12–46)
Lymphocytes Relative: 6 % — ABNORMAL LOW (ref 12–46)
Lymphocytes Relative: 8 % — ABNORMAL LOW (ref 12–46)
Lymphs Abs: 0.9 10*3/uL (ref 0.7–4.0)
Lymphs Abs: 0.9 10*3/uL (ref 0.7–4.0)
Lymphs Abs: 1.1 10*3/uL (ref 0.7–4.0)
Lymphs Abs: 1.2 10*3/uL (ref 0.7–4.0)
Lymphs Abs: 1.5 10*3/uL (ref 0.7–4.0)
Monocytes Absolute: 1.4 10*3/uL — ABNORMAL HIGH (ref 0.1–1.0)
Monocytes Absolute: 1.4 10*3/uL — ABNORMAL HIGH (ref 0.1–1.0)
Monocytes Absolute: 1.4 10*3/uL — ABNORMAL HIGH (ref 0.1–1.0)
Monocytes Absolute: 1.4 10*3/uL — ABNORMAL HIGH (ref 0.1–1.0)
Monocytes Absolute: 2.1 10*3/uL — ABNORMAL HIGH (ref 0.1–1.0)
Monocytes Relative: 10 % (ref 3–12)
Monocytes Relative: 10 % (ref 3–12)
Monocytes Relative: 7 % (ref 3–12)
Monocytes Relative: 7 % (ref 3–12)
Monocytes Relative: 9 % (ref 3–12)
Neutro Abs: 12.4 10*3/uL — ABNORMAL HIGH (ref 1.7–7.7)
Neutro Abs: 16.7 10*3/uL — ABNORMAL HIGH (ref 1.7–7.7)
Neutro Abs: 17.5 10*3/uL — ABNORMAL HIGH (ref 1.7–7.7)
Neutro Abs: 18.3 10*3/uL — ABNORMAL HIGH (ref 1.7–7.7)
Neutrophils Relative %: 75 % (ref 43–77)
Neutrophils Relative %: 83 % — ABNORMAL HIGH (ref 43–77)

## 2010-11-25 LAB — RAPID URINE DRUG SCREEN, HOSP PERFORMED
Amphetamines: NOT DETECTED
Benzodiazepines: POSITIVE — AB
Cocaine: NOT DETECTED

## 2010-11-25 LAB — CBC
HCT: 27.9 % — ABNORMAL LOW (ref 39.0–52.0)
HCT: 28.4 % — ABNORMAL LOW (ref 39.0–52.0)
HCT: 28.9 % — ABNORMAL LOW (ref 39.0–52.0)
HCT: 32 % — ABNORMAL LOW (ref 39.0–52.0)
HCT: 32.6 % — ABNORMAL LOW (ref 39.0–52.0)
HCT: 33 % — ABNORMAL LOW (ref 39.0–52.0)
HCT: 33.4 % — ABNORMAL LOW (ref 39.0–52.0)
HCT: 33.9 % — ABNORMAL LOW (ref 39.0–52.0)
HCT: 34.1 % — ABNORMAL LOW (ref 39.0–52.0)
HCT: 34.7 % — ABNORMAL LOW (ref 39.0–52.0)
HCT: 36.7 % — ABNORMAL LOW (ref 39.0–52.0)
HCT: 41.4 % (ref 39.0–52.0)
Hemoglobin: 10.2 g/dL — ABNORMAL LOW (ref 13.0–17.0)
Hemoglobin: 10.5 g/dL — ABNORMAL LOW (ref 13.0–17.0)
Hemoglobin: 11 g/dL — ABNORMAL LOW (ref 13.0–17.0)
Hemoglobin: 11.1 g/dL — ABNORMAL LOW (ref 13.0–17.0)
Hemoglobin: 11.5 g/dL — ABNORMAL LOW (ref 13.0–17.0)
Hemoglobin: 11.6 g/dL — ABNORMAL LOW (ref 13.0–17.0)
Hemoglobin: 12.1 g/dL — ABNORMAL LOW (ref 13.0–17.0)
Hemoglobin: 14.1 g/dL (ref 13.0–17.0)
Hemoglobin: 7.4 g/dL — CL (ref 13.0–17.0)
Hemoglobin: 9.2 g/dL — ABNORMAL LOW (ref 13.0–17.0)
Hemoglobin: 9.3 g/dL — ABNORMAL LOW (ref 13.0–17.0)
Hemoglobin: 9.7 g/dL — ABNORMAL LOW (ref 13.0–17.0)
Hemoglobin: 9.8 g/dL — ABNORMAL LOW (ref 13.0–17.0)
MCHC: 32.8 g/dL (ref 30.0–36.0)
MCHC: 33.2 g/dL (ref 30.0–36.0)
MCHC: 33.2 g/dL (ref 30.0–36.0)
MCHC: 33.3 g/dL (ref 30.0–36.0)
MCHC: 33.5 g/dL (ref 30.0–36.0)
MCHC: 33.5 g/dL (ref 30.0–36.0)
MCHC: 33.7 g/dL (ref 30.0–36.0)
MCHC: 33.9 g/dL (ref 30.0–36.0)
MCHC: 34 g/dL (ref 30.0–36.0)
MCHC: 34.2 g/dL (ref 30.0–36.0)
MCHC: 34.3 g/dL (ref 30.0–36.0)
MCV: 90.9 fL (ref 78.0–100.0)
MCV: 91 fL (ref 78.0–100.0)
MCV: 91.1 fL (ref 78.0–100.0)
MCV: 91.2 fL (ref 78.0–100.0)
MCV: 91.3 fL (ref 78.0–100.0)
MCV: 91.3 fL (ref 78.0–100.0)
MCV: 91.4 fL (ref 78.0–100.0)
MCV: 91.9 fL (ref 78.0–100.0)
MCV: 92.4 fL (ref 78.0–100.0)
MCV: 92.5 fL (ref 78.0–100.0)
MCV: 92.8 fL (ref 78.0–100.0)
MCV: 92.8 fL (ref 78.0–100.0)
MCV: 93.2 fL (ref 78.0–100.0)
MCV: 93.5 fL (ref 78.0–100.0)
MCV: 94.5 fL (ref 78.0–100.0)
Platelets: 142 10*3/uL — ABNORMAL LOW (ref 150–400)
Platelets: 153 10*3/uL (ref 150–400)
Platelets: 246 10*3/uL (ref 150–400)
Platelets: 281 10*3/uL (ref 150–400)
Platelets: 343 10*3/uL (ref 150–400)
Platelets: 403 10*3/uL — ABNORMAL HIGH (ref 150–400)
Platelets: 433 10*3/uL — ABNORMAL HIGH (ref 150–400)
Platelets: 478 10*3/uL — ABNORMAL HIGH (ref 150–400)
RBC: 2.96 MIL/uL — ABNORMAL LOW (ref 4.22–5.81)
RBC: 3.12 MIL/uL — ABNORMAL LOW (ref 4.22–5.81)
RBC: 3.18 MIL/uL — ABNORMAL LOW (ref 4.22–5.81)
RBC: 3.19 MIL/uL — ABNORMAL LOW (ref 4.22–5.81)
RBC: 3.25 MIL/uL — ABNORMAL LOW (ref 4.22–5.81)
RBC: 3.47 MIL/uL — ABNORMAL LOW (ref 4.22–5.81)
RBC: 3.51 MIL/uL — ABNORMAL LOW (ref 4.22–5.81)
RBC: 3.58 MIL/uL — ABNORMAL LOW (ref 4.22–5.81)
RBC: 3.62 MIL/uL — ABNORMAL LOW (ref 4.22–5.81)
RBC: 3.64 MIL/uL — ABNORMAL LOW (ref 4.22–5.81)
RBC: 3.82 MIL/uL — ABNORMAL LOW (ref 4.22–5.81)
RBC: 3.82 MIL/uL — ABNORMAL LOW (ref 4.22–5.81)
RBC: 3.87 MIL/uL — ABNORMAL LOW (ref 4.22–5.81)
RDW: 12.3 % (ref 11.5–15.5)
RDW: 12.5 % (ref 11.5–15.5)
RDW: 12.6 % (ref 11.5–15.5)
RDW: 12.6 % (ref 11.5–15.5)
RDW: 12.7 % (ref 11.5–15.5)
RDW: 12.7 % (ref 11.5–15.5)
RDW: 12.7 % (ref 11.5–15.5)
RDW: 12.7 % (ref 11.5–15.5)
RDW: 13.5 % (ref 11.5–15.5)
RDW: 13.8 % (ref 11.5–15.5)
RDW: 13.9 % (ref 11.5–15.5)
RDW: 14.3 % (ref 11.5–15.5)
WBC: 12.5 10*3/uL — ABNORMAL HIGH (ref 4.0–10.5)
WBC: 12.8 10*3/uL — ABNORMAL HIGH (ref 4.0–10.5)
WBC: 13.2 10*3/uL — ABNORMAL HIGH (ref 4.0–10.5)
WBC: 14.4 10*3/uL — ABNORMAL HIGH (ref 4.0–10.5)
WBC: 14.5 10*3/uL — ABNORMAL HIGH (ref 4.0–10.5)
WBC: 14.9 10*3/uL — ABNORMAL HIGH (ref 4.0–10.5)
WBC: 15.7 10*3/uL — ABNORMAL HIGH (ref 4.0–10.5)
WBC: 15.8 10*3/uL — ABNORMAL HIGH (ref 4.0–10.5)
WBC: 16.5 10*3/uL — ABNORMAL HIGH (ref 4.0–10.5)
WBC: 20.3 10*3/uL — ABNORMAL HIGH (ref 4.0–10.5)
WBC: 21.8 10*3/uL — ABNORMAL HIGH (ref 4.0–10.5)
WBC: 22.1 10*3/uL — ABNORMAL HIGH (ref 4.0–10.5)
WBC: 7.8 10*3/uL (ref 4.0–10.5)
WBC: 9.9 10*3/uL (ref 4.0–10.5)

## 2010-11-25 LAB — GLUCOSE, CAPILLARY
Glucose-Capillary: 109 mg/dL — ABNORMAL HIGH (ref 70–99)
Glucose-Capillary: 109 mg/dL — ABNORMAL HIGH (ref 70–99)
Glucose-Capillary: 116 mg/dL — ABNORMAL HIGH (ref 70–99)
Glucose-Capillary: 118 mg/dL — ABNORMAL HIGH (ref 70–99)
Glucose-Capillary: 119 mg/dL — ABNORMAL HIGH (ref 70–99)
Glucose-Capillary: 119 mg/dL — ABNORMAL HIGH (ref 70–99)
Glucose-Capillary: 124 mg/dL — ABNORMAL HIGH (ref 70–99)
Glucose-Capillary: 124 mg/dL — ABNORMAL HIGH (ref 70–99)
Glucose-Capillary: 125 mg/dL — ABNORMAL HIGH (ref 70–99)
Glucose-Capillary: 125 mg/dL — ABNORMAL HIGH (ref 70–99)
Glucose-Capillary: 126 mg/dL — ABNORMAL HIGH (ref 70–99)
Glucose-Capillary: 126 mg/dL — ABNORMAL HIGH (ref 70–99)
Glucose-Capillary: 126 mg/dL — ABNORMAL HIGH (ref 70–99)
Glucose-Capillary: 127 mg/dL — ABNORMAL HIGH (ref 70–99)
Glucose-Capillary: 131 mg/dL — ABNORMAL HIGH (ref 70–99)
Glucose-Capillary: 131 mg/dL — ABNORMAL HIGH (ref 70–99)
Glucose-Capillary: 131 mg/dL — ABNORMAL HIGH (ref 70–99)
Glucose-Capillary: 133 mg/dL — ABNORMAL HIGH (ref 70–99)
Glucose-Capillary: 133 mg/dL — ABNORMAL HIGH (ref 70–99)
Glucose-Capillary: 133 mg/dL — ABNORMAL HIGH (ref 70–99)
Glucose-Capillary: 134 mg/dL — ABNORMAL HIGH (ref 70–99)
Glucose-Capillary: 134 mg/dL — ABNORMAL HIGH (ref 70–99)
Glucose-Capillary: 134 mg/dL — ABNORMAL HIGH (ref 70–99)
Glucose-Capillary: 136 mg/dL — ABNORMAL HIGH (ref 70–99)
Glucose-Capillary: 138 mg/dL — ABNORMAL HIGH (ref 70–99)
Glucose-Capillary: 138 mg/dL — ABNORMAL HIGH (ref 70–99)
Glucose-Capillary: 139 mg/dL — ABNORMAL HIGH (ref 70–99)
Glucose-Capillary: 139 mg/dL — ABNORMAL HIGH (ref 70–99)
Glucose-Capillary: 139 mg/dL — ABNORMAL HIGH (ref 70–99)
Glucose-Capillary: 140 mg/dL — ABNORMAL HIGH (ref 70–99)
Glucose-Capillary: 142 mg/dL — ABNORMAL HIGH (ref 70–99)
Glucose-Capillary: 142 mg/dL — ABNORMAL HIGH (ref 70–99)
Glucose-Capillary: 146 mg/dL — ABNORMAL HIGH (ref 70–99)
Glucose-Capillary: 146 mg/dL — ABNORMAL HIGH (ref 70–99)
Glucose-Capillary: 149 mg/dL — ABNORMAL HIGH (ref 70–99)
Glucose-Capillary: 151 mg/dL — ABNORMAL HIGH (ref 70–99)
Glucose-Capillary: 157 mg/dL — ABNORMAL HIGH (ref 70–99)
Glucose-Capillary: 158 mg/dL — ABNORMAL HIGH (ref 70–99)
Glucose-Capillary: 165 mg/dL — ABNORMAL HIGH (ref 70–99)

## 2010-11-25 LAB — CROSSMATCH: Antibody Screen: NEGATIVE

## 2010-11-25 LAB — CHOLESTEROL, TOTAL: Cholesterol: 71 mg/dL (ref 0–200)

## 2010-11-25 LAB — BASIC METABOLIC PANEL
BUN: 15 mg/dL (ref 6–23)
BUN: 15 mg/dL (ref 6–23)
BUN: 17 mg/dL (ref 6–23)
BUN: 19 mg/dL (ref 6–23)
CO2: 24 mEq/L (ref 19–32)
CO2: 25 mEq/L (ref 19–32)
CO2: 27 mEq/L (ref 19–32)
CO2: 30 mEq/L (ref 19–32)
Calcium: 8.1 mg/dL — ABNORMAL LOW (ref 8.4–10.5)
Calcium: 8.4 mg/dL (ref 8.4–10.5)
Calcium: 8.6 mg/dL (ref 8.4–10.5)
Chloride: 102 mEq/L (ref 96–112)
Chloride: 106 mEq/L (ref 96–112)
Chloride: 106 mEq/L (ref 96–112)
Creatinine, Ser: 0.65 mg/dL (ref 0.4–1.5)
Creatinine, Ser: 0.68 mg/dL (ref 0.4–1.5)
Creatinine, Ser: 0.7 mg/dL (ref 0.4–1.5)
Creatinine, Ser: 0.88 mg/dL (ref 0.4–1.5)
GFR calc Af Amer: >60 mL/min (ref >60)
GFR calc Af Amer: >60 mL/min (ref >60)
GFR calc Af Amer: >60 mL/min (ref >60)
GFR calc Af Amer: >60 mL/min (ref >60)
GFR calc non Af Amer: >60 mL/min (ref >60)
GFR calc non Af Amer: >60 mL/min (ref >60)
GFR calc non Af Amer: >60 mL/min (ref >60)
GFR calc non Af Amer: >60 mL/min (ref >60)
GFR calc non Af Amer: >60 mL/min (ref >60)
GFR calc non Af Amer: >60 mL/min (ref >60)
Glucose, Bld: 115 mg/dL — ABNORMAL HIGH (ref 70–99)
Glucose, Bld: 119 mg/dL — ABNORMAL HIGH (ref 70–99)
Glucose, Bld: 132 mg/dL — ABNORMAL HIGH (ref 70–99)
Glucose, Bld: 138 mg/dL — ABNORMAL HIGH (ref 70–99)
Glucose, Bld: 138 mg/dL — ABNORMAL HIGH (ref 70–99)
Glucose, Bld: 150 mg/dL — ABNORMAL HIGH (ref 70–99)
Potassium: 3.6 mEq/L (ref 3.5–5.1)
Potassium: 3.9 mEq/L (ref 3.5–5.1)
Potassium: 3.9 mEq/L (ref 3.5–5.1)
Potassium: 4.2 mEq/L (ref 3.5–5.1)
Potassium: 4.3 mEq/L (ref 3.5–5.1)
Sodium: 136 mEq/L (ref 135–145)
Sodium: 136 mEq/L (ref 135–145)
Sodium: 137 mEq/L (ref 135–145)
Sodium: 138 mEq/L (ref 135–145)
Sodium: 139 mEq/L (ref 135–145)

## 2010-11-25 LAB — CULTURE, BLOOD (ROUTINE X 2)
Culture: NO GROWTH
Culture: NO GROWTH
Culture: NO GROWTH

## 2010-11-25 LAB — URINALYSIS, ROUTINE W REFLEX MICROSCOPIC
Nitrite: NEGATIVE
Specific Gravity, Urine: 1.024 (ref 1.005–1.030)
Urobilinogen, UA: 1 mg/dL (ref 0.0–1.0)

## 2010-11-25 LAB — HEMOGLOBIN A1C: Hgb A1c MFr Bld: 5.4 % (ref 4.6–6.1)

## 2010-11-25 LAB — URINE MICROSCOPIC-ADD ON

## 2010-11-25 LAB — PROTIME-INR: Prothrombin Time: 17.2 seconds — ABNORMAL HIGH (ref 11.6–15.2)

## 2010-11-25 LAB — CLOSTRIDIUM DIFFICILE EIA: C difficile Toxins A+B, EIA: NEGATIVE

## 2010-11-25 LAB — HEPATIC FUNCTION PANEL
Alkaline Phosphatase: 55 U/L (ref 39–117)
Indirect Bilirubin: 0.8 mg/dL (ref 0.3–0.9)
Total Bilirubin: 1.1 mg/dL (ref 0.3–1.2)
Total Protein: 5.8 g/dL — ABNORMAL LOW (ref 6.0–8.3)

## 2010-11-25 LAB — URINE CULTURE

## 2010-11-25 LAB — LACTATE DEHYDROGENASE: LDH: 182 U/L (ref 94–250)

## 2010-11-25 LAB — PHOSPHORUS
Phosphorus: 2.5 mg/dL (ref 2.3–4.6)
Phosphorus: 2.5 mg/dL (ref 2.3–4.6)
Phosphorus: 3 mg/dL (ref 2.3–4.6)

## 2010-11-25 LAB — MAGNESIUM
Magnesium: 2.1 mg/dL (ref 1.5–2.5)
Magnesium: 2.2 mg/dL (ref 1.5–2.5)
Magnesium: 2.2 mg/dL (ref 1.5–2.5)
Magnesium: 2.4 mg/dL (ref 1.5–2.5)

## 2010-11-25 LAB — B-NATRIURETIC PEPTIDE (CONVERTED LAB): Pro B Natriuretic peptide (BNP): <30 pg/mL (ref 0.0–100.0)

## 2010-11-25 LAB — VANCOMYCIN, TROUGH: Vancomycin Tr: 17.7 ug/mL (ref 10.0–20.0)

## 2010-11-25 LAB — TRIGLYCERIDES: Triglycerides: 94 mg/dL (ref <150)

## 2010-11-25 LAB — PHENYTOIN LEVEL, TOTAL: Phenytoin Lvl: <2.5 ug/mL — ABNORMAL LOW (ref 10.0–20.0)

## 2010-11-25 LAB — LACTIC ACID, PLASMA: Lactic Acid, Venous: 1.1 mmol/L (ref 0.5–2.2)

## 2011-02-14 ENCOUNTER — Observation Stay (HOSPITAL_COMMUNITY)
Admission: EM | Admit: 2011-02-14 | Discharge: 2011-03-23 | Payer: Medicare Other | Attending: Internal Medicine | Admitting: Internal Medicine

## 2011-02-14 ENCOUNTER — Encounter: Payer: Self-pay | Admitting: *Deleted

## 2011-02-14 DIAGNOSIS — K219 Gastro-esophageal reflux disease without esophagitis: Secondary | ICD-10-CM | POA: Insufficient documentation

## 2011-02-14 DIAGNOSIS — J390 Retropharyngeal and parapharyngeal abscess: Secondary | ICD-10-CM

## 2011-02-14 DIAGNOSIS — I10 Essential (primary) hypertension: Principal | ICD-10-CM | POA: Diagnosis present

## 2011-02-14 DIAGNOSIS — F0390 Unspecified dementia without behavioral disturbance: Secondary | ICD-10-CM | POA: Insufficient documentation

## 2011-02-14 DIAGNOSIS — F1010 Alcohol abuse, uncomplicated: Secondary | ICD-10-CM

## 2011-02-14 DIAGNOSIS — D696 Thrombocytopenia, unspecified: Secondary | ICD-10-CM

## 2011-02-14 DIAGNOSIS — E119 Type 2 diabetes mellitus without complications: Secondary | ICD-10-CM | POA: Insufficient documentation

## 2011-02-14 DIAGNOSIS — G40909 Epilepsy, unspecified, not intractable, without status epilepticus: Secondary | ICD-10-CM | POA: Insufficient documentation

## 2011-02-14 DIAGNOSIS — F79 Unspecified intellectual disabilities: Secondary | ICD-10-CM | POA: Insufficient documentation

## 2011-02-14 DIAGNOSIS — F29 Unspecified psychosis not due to a substance or known physiological condition: Secondary | ICD-10-CM | POA: Diagnosis present

## 2011-02-14 DIAGNOSIS — R569 Unspecified convulsions: Secondary | ICD-10-CM

## 2011-02-14 DIAGNOSIS — D649 Anemia, unspecified: Secondary | ICD-10-CM

## 2011-02-14 DIAGNOSIS — G809 Cerebral palsy, unspecified: Secondary | ICD-10-CM | POA: Insufficient documentation

## 2011-02-14 DIAGNOSIS — Z79899 Other long term (current) drug therapy: Secondary | ICD-10-CM | POA: Insufficient documentation

## 2011-02-14 HISTORY — DX: Anemia, unspecified: D64.9

## 2011-02-14 HISTORY — DX: Gastro-esophageal reflux disease without esophagitis: K21.9

## 2011-02-14 HISTORY — DX: Acute kidney failure, unspecified: N17.9

## 2011-02-14 HISTORY — DX: Unspecified speech disturbances: R47.9

## 2011-02-14 HISTORY — DX: Unspecified intellectual disabilities: F79

## 2011-02-14 HISTORY — DX: Unspecified convulsions: R56.9

## 2011-02-14 HISTORY — DX: Unspecified dementia, unspecified severity, without behavioral disturbance, psychotic disturbance, mood disturbance, and anxiety: F03.90

## 2011-02-14 HISTORY — DX: Essential (primary) hypertension: I10

## 2011-02-14 HISTORY — DX: Cerebral palsy, unspecified: G80.9

## 2011-02-14 LAB — URINALYSIS, ROUTINE W REFLEX MICROSCOPIC
Leukocytes, UA: NEGATIVE
Nitrite: NEGATIVE
Protein, ur: NEGATIVE mg/dL
Urobilinogen, UA: 0.2 mg/dL (ref 0.0–1.0)

## 2011-02-14 LAB — DIFFERENTIAL
Basophils Relative: 0 % (ref 0–1)
Eosinophils Absolute: 0 10*3/uL (ref 0.0–0.7)
Monocytes Relative: 13 % — ABNORMAL HIGH (ref 3–12)
Neutrophils Relative %: 51 % (ref 43–77)

## 2011-02-14 LAB — CBC
HCT: 40.7 % (ref 39.0–52.0)
Hemoglobin: 13.8 g/dL (ref 13.0–17.0)
MCHC: 33.9 g/dL (ref 30.0–36.0)
WBC: 5.5 10*3/uL (ref 4.0–10.5)

## 2011-02-14 LAB — COMPREHENSIVE METABOLIC PANEL
Alkaline Phosphatase: 105 U/L (ref 39–117)
BUN: 11 mg/dL (ref 6–23)
Chloride: 101 mEq/L (ref 96–112)
GFR calc Af Amer: >90 mL/min (ref >90)
Glucose, Bld: 115 mg/dL — ABNORMAL HIGH (ref 70–99)
Potassium: 3.2 mEq/L — ABNORMAL LOW (ref 3.5–5.1)
Total Bilirubin: 0.3 mg/dL (ref 0.3–1.2)

## 2011-02-14 LAB — URINE MICROSCOPIC-ADD ON

## 2011-02-14 LAB — RAPID URINE DRUG SCREEN, HOSP PERFORMED
Amphetamines: NOT DETECTED
Barbiturates: NOT DETECTED
Tetrahydrocannabinol: NOT DETECTED

## 2011-02-14 MED ORDER — CARBAMAZEPINE 200 MG PO TABS
200.0000 mg | ORAL_TABLET | Freq: Two times a day (BID) | ORAL | Status: DC
  Administered 2011-02-15 – 2011-02-20 (×6): 200 mg via ORAL
  Filled 2011-02-14 (×17): qty 1

## 2011-02-14 MED ORDER — LORAZEPAM 2 MG/ML IJ SOLN
2.0000 mg | Freq: Once | INTRAMUSCULAR | Status: AC
  Administered 2011-02-14: 2 mg via INTRAMUSCULAR
  Filled 2011-02-14: qty 1

## 2011-02-14 NOTE — ED Notes (Addendum)
Patient refused Tegretol medication and NH4 lab draw. He is resting and not cooperative when roused. Telepsych consult called but psychiatrist has not returned call. Dr. Fonnie Jarvis notified of all of above. Will cont. To monitor.

## 2011-02-14 NOTE — ED Notes (Signed)
Pt refusing to take medications at home.

## 2011-02-14 NOTE — ED Notes (Signed)
Pt still refusing to have labs drawn. RN at bedside.

## 2011-02-14 NOTE — ED Provider Notes (Signed)
History     CSN: [id number]  Arrival date & time 02/14/11  1618   First MD Initiated Contact with Patient 02/14/11 1659      Chief Complaint  Patient presents with  . Medical Clearance    (Consider location/radiation/quality/duration/timing/severity/associated sxs/prior treatment) HPI This 70 year old male is sent from a living facility as an involuntary commitment do to threatening behavior to staff and other patients, apparently he may have had hallucinations hearing a radio in his ears but will not answer questions now so we cannot tell if he is suicidal homicidal or hallucinating, his past medical history is significant for intellectual disability with mental retardation and dementia as well as a history of seizures. History illness and review of systems are otherwise unobtainable due to the patient is refusing to answer questions except to say in an angry voice "don't do that" when I am trying to perform parts of the screening examination. Past Medical History  Diagnosis Date  . Cerebral palsy   . Mental retardation   . Diabetes mellitus   . Dementia   . Seizures   . Gastroesophageal reflux disease   . Acute renal failure   . Constipation   . Speech impairment     History reviewed. No pertinent past surgical history.  No family history on file.  History  Substance Use Topics  . Smoking status: Not on file  . Smokeless tobacco: Not on file  . Alcohol Use:       Review of Systems  Unable to perform ROS: Dementia    Allergies  Review of patient's allergies indicates no known allergies.  Home Medications  No current outpatient prescriptions on file.  BP 161/116  Pulse 95  Temp(Src) 98 F (36.7 C) (Oral)  Resp 18  SpO2 100%  Physical Exam  Nursing note and vitals reviewed. Constitutional:       Awake, alert, nontoxic appearance.  HENT:  Head: Atraumatic.  Eyes: Conjunctivae are normal. Pupils are equal, round, and reactive to light. Right eye  exhibits no discharge. Left eye exhibits no discharge.  Neck: Neck supple.  Cardiovascular: Normal rate and regular rhythm.   No murmur heard. Pulmonary/Chest: Effort normal. No respiratory distress. He has no wheezes. He has no rales. He exhibits no tenderness.  Abdominal: Soft. Bowel sounds are normal. There is no tenderness. There is no rebound.  Musculoskeletal: He exhibits no edema and no tenderness.       Baseline ROM, no obvious new focal weakness.  Neurological: He is alert.       The patient was also restricted as well but does not answer questions to assess his orientation upon arrival  Skin: No rash noted.  Psychiatric:       The patient appears somewhat anxious and refuses to answer questions and appears angry when I try to examine him pushing me away and turning himself away at times; I cannot tell upon arrival whether or not he is suicidal homicidal or hallucinating    ED Course  Procedures (including critical care time) ACT will see Pt and TelePsych will be consulted.[id number]  Case was discussed with Telepsychiatry who recommended Tegretol po, which the patient refused to take in the ED but then fell asleep and has been sleeping for the last few hours.  Disposition is pending ACT eval.   Labs Reviewed  COMPREHENSIVE METABOLIC PANEL - Abnormal; Notable for the following:    Potassium 3.2 (*)    Glucose, Bld 115 (*)    GFR calc  non Af Amer 84 (*)    All other components within normal limits  URINALYSIS, ROUTINE W REFLEX MICROSCOPIC - Abnormal; Notable for the following:    Hgb urine dipstick SMALL (*)    All other components within normal limits  DIFFERENTIAL - Abnormal; Notable for the following:    Monocytes Relative 13 (*)    All other components within normal limits  URINE MICROSCOPIC-ADD ON - Abnormal; Notable for the following:    Bacteria, UA FEW (*)    All other components within normal limits  CBC  ETHANOL  URINE RAPID DRUG SCREEN (HOSP PERFORMED)  GLUCOSE,  CAPILLARY  POCT CBG MONITORING   No results found.   1. Psychosis   2. Dementia   3. Mental retardation       MDM          Hurman Horn, MD 02/15/11 0010

## 2011-02-14 NOTE — ED Notes (Signed)
Patient admitted to psych ED from triage. He is non-verbal presently but rolls eyes as if understands some statements and questions. He is sitting on bed cooperatively but unable to assess mental status presently.

## 2011-02-14 NOTE — ED Notes (Signed)
Attempted to check patient's CBG, but he became agitated and would not cooperate. Dr. Fonnie Jarvis notified.

## 2011-02-14 NOTE — ED Notes (Signed)
Report given to Toniann Fail- belonging bag x 1 remains with pt - to transfer to psych ED- pt calm and cooperative

## 2011-02-14 NOTE — ED Notes (Signed)
Unable to assess medical Hx, social Hx, allergies, meds due to pt being uncooperative.

## 2011-02-14 NOTE — ED Notes (Signed)
Pt. Belongings are lock in his room under the cabinet.

## 2011-02-14 NOTE — ED Notes (Signed)
Pt coming from a [facility] community, IVC with [employer]. Was threatening staff and other pts. Staff reports pt has stated he hears a radio in his ears. Pt will not answer questions. Cannot assess SI/HI.

## 2011-02-14 NOTE — ED Notes (Signed)
Pt refusing to have any labs drawn at this time

## 2011-02-14 NOTE — ED Notes (Signed)
Pt. Ambulated to [facility] room 43, offered pt. Water, drank 4oz, offered more and applesauce, pt. Closed eyes and didn't say anything, reminded pt. That drink/snack at bedside table.

## 2011-02-15 DIAGNOSIS — F29 Unspecified psychosis not due to a substance or known physiological condition: Secondary | ICD-10-CM

## 2011-02-15 MED ORDER — TRAZODONE HCL 100 MG PO TABS
100.0000 mg | ORAL_TABLET | Freq: Every day | ORAL | Status: DC
  Administered 2011-02-15: 100 mg via ORAL
  Filled 2011-02-15: qty 1

## 2011-02-15 MED ORDER — QUETIAPINE FUMARATE ER 400 MG PO TB24
400.0000 mg | ORAL_TABLET | Freq: Every day | ORAL | Status: DC
  Administered 2011-02-15: 400 mg via ORAL
  Filled 2011-02-15 (×3): qty 1

## 2011-02-15 MED ORDER — PHENYTOIN SODIUM EXTENDED 100 MG PO CAPS
100.0000 mg | ORAL_CAPSULE | Freq: Three times a day (TID) | ORAL | Status: DC
  Administered 2011-02-15 – 2011-03-23 (×77): 100 mg via ORAL
  Filled 2011-02-15 (×116): qty 1

## 2011-02-15 MED ORDER — LEVETIRACETAM 500 MG PO TABS
500.0000 mg | ORAL_TABLET | Freq: Two times a day (BID) | ORAL | Status: DC
  Administered 2011-02-15 – 2011-03-13 (×33): 500 mg via ORAL
  Filled 2011-02-15 (×79): qty 1

## 2011-02-15 NOTE — ED Notes (Signed)
Very hard to get patient to take his medications, mumbles but nothing is understood, took 2 RNs to talk him into taking his seizure medications but eventually he does take them all

## 2011-02-15 NOTE — ED Notes (Signed)
Patient is resting comfortably. 

## 2011-02-15 NOTE — ED Provider Notes (Signed)
Pt sitting upright on stretcher. Calm, alert. Watching tv intently. Denies any c/o. SW consulted re placement back to ECF. Dr Rogers Blocker to round on pt this morning.   Suzi Roots, MD 02/15/11 [phone]

## 2011-02-15 NOTE — ED Notes (Signed)
Pt refused CBG at 2100. 

## 2011-02-15 NOTE — ED Notes (Signed)
Offered [place] sandwich, he has refused this as well, mouthing something (don't know what), asked what he liked or wanted to eat, mumbling nothing that is understood, showing his teeth and growling at this nurse

## 2011-02-15 NOTE — BH Assessment (Signed)
Assessment Note   Wesley Sullivan is a 69 y.o. male who presents to [facility] from unknown retirement community.  Pt is IVC by brother and brought in by [employer].  QPE completed.  Pt has not been assessed by ACT, EDP initiated telepsych but cancelled because pt refuses to speak to any medical staff and is uncooperative at this time.  Pt is refusing to take meds and has been threatening staff and patients are his current retirement community.  Per info given to [employer], pt cannot return to facility, placement will need to be sought by social work staff.  Pt arrived at ed with no info from retirement community, pt.'s brother will need to be contacted in the am to retrieve medical info, medications, name of retirement community.  Pt.'s retirement facility will need to be contacted also for further info.  Pt has been given dx of mental retardation; no formal dx, IQ ratio is available to determine what pt.'s baseline is and the onset current behaviors is normal, when behaviors started and progression of dementia dx.  Per Dr. Fonnie Jarvis, requests Dr. Rogers Blocker complete am consult to complete disposition for pt.  Pt has been given ativan and has been resting comfortably for several hrs.   Axis I: Mood Disorder NOS Axis II: Mental retardation, severity unknown Axis III:  Past Medical History  Diagnosis Date  . Cerebral palsy   . Mental retardation   . Diabetes mellitus   . Dementia   . Seizures   . Gastroesophageal reflux disease   . Acute renal failure   . Constipation   . Speech impairment    Axis IV: housing problems, other psychosocial or environmental problems and problems with primary support group Axis V: 11-20 some danger of hurting self or others possible OR occasionally fails to maintain minimal personal hygiene OR gross impairment in communication  Past Medical History:  Past Medical History  Diagnosis Date  . Cerebral palsy   . Mental retardation   . Diabetes mellitus   . Dementia   .  Seizures   . Gastroesophageal reflux disease   . Acute renal failure   . Constipation   . Speech impairment     History reviewed. No pertinent past surgical history.  Family History: No family history on file.  Social History:  does not have a smoking history on file. He does not have any smokeless tobacco history on file. His alcohol and drug histories not on file.  Additional Social History:    Allergies: No Known Allergies  Home Medications:  Medications Prior to Admission  Medication Dose Route Frequency Provider Last Rate Last Dose  . carbamazepine (TEGRETOL) tablet 200 mg  200 mg Oral BID Hurman Horn, MD      . LORazepam (ATIVAN) injection 2 mg  2 mg Intramuscular Once Hurman Horn, MD   2 mg at 02/14/11 1827   No current outpatient prescriptions on file as of 02/14/2011.    OB/GYN Status:  No LMP for male patient.        Risk to self Is patient at risk for suicide?: No Substance abuse history and/or treatment for substance abuse?: No        Mental Status Report Motor Activity: Freedom of movement                     Values / Beliefs Cultural Requests During Hospitalization: None Spiritual Requests During Hospitalization: None  Disposition:     On Site Evaluation by:   Reviewed with Physician:     Murrell Redden 02/15/2011 1:44 AM
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
## 2011-02-15 NOTE — ED Notes (Signed)
Dr. Fonnie Jarvis in to check on patient.  Pt remains asleep at this time.

## 2011-02-15 NOTE — ED Notes (Signed)
Pt continues to refuse most food offered, but drank of Coke at this time. Pt's speech is sometimes coherent and sometimes not. At the present time, pt told nurse that he "don't eat that kind", referring to the bag of cookies writer gave him, but then ate them when no one was in the room.  Writer offered him a sandwich, applesauce, a fruit cup, pretzels and crackers, all which were refused. Pt did finally eat 2 cups of ice cream ( total) when nurse softened the ice cream so he could feed himself. Pt is able to feed self but needs set up assistance due to dx: CP. Pt talks loudly, and uses a combination of words and gestures to communicate. He tells Clinical research associate that he could hear me talking to the 'other girl' in the office, and then points to his right temple and states "I know, I heard you saying, I smart." Pt seems to do well with positive reinforcement. Presently watching TV and eating ice cream. No acute distress, denies pain.

## 2011-02-15 NOTE — ED Notes (Signed)
Patient was not discharged.

## 2011-02-15 NOTE — ED Notes (Signed)
Pt's 2200 meds given at this time due to pt refusal earlier. Pt continues to refuse Tegretol.

## 2011-02-16 ENCOUNTER — Encounter (HOSPITAL_COMMUNITY): Payer: Self-pay | Admitting: *Deleted

## 2011-02-16 DIAGNOSIS — F29 Unspecified psychosis not due to a substance or known physiological condition: Secondary | ICD-10-CM | POA: Diagnosis present

## 2011-02-16 MED ORDER — METOPROLOL TARTRATE 1 MG/ML IV SOLN: 5.0000 mg | INTRAVENOUS | Status: AC

## 2011-02-16 MED ORDER — OLANZAPINE 10 MG IM SOLR
5.0000 mg | Freq: Once | INTRAMUSCULAR | Status: AC
  Administered 2011-02-16: 5 mg via INTRAMUSCULAR
  Filled 2011-02-16: qty 10

## 2011-02-16 MED ORDER — OLANZAPINE 5 MG PO TBDP
5.0000 mg | ORAL_TABLET | Freq: Two times a day (BID) | ORAL | Status: DC
  Administered 2011-02-17: 5 mg via ORAL
  Filled 2011-02-16 (×6): qty 1

## 2011-02-16 MED ORDER — HALOPERIDOL LACTATE 5 MG/ML IJ SOLN
5.0000 mg | Freq: Four times a day (QID) | INTRAMUSCULAR | Status: DC | PRN
  Administered 2011-02-16 – 2011-02-22 (×7): 5 mg via INTRAMUSCULAR
  Filled 2011-02-16 (×8): qty 1

## 2011-02-16 MED ORDER — CLONIDINE HCL 0.1 MG PO TABS: 0.1000 mg | ORAL_TABLET | Freq: Once | ORAL | Status: DC

## 2011-02-16 MED ORDER — HALOPERIDOL LACTATE 5 MG/ML IJ SOLN
5.0000 mg | INTRAMUSCULAR | Status: AC
  Administered 2011-02-16: 5 mg via INTRAMUSCULAR
  Filled 2011-02-16: qty 1

## 2011-02-16 MED ORDER — MELATONIN 1 MG PO CAPS: 1.0000 | ORAL_CAPSULE | Freq: Every day | ORAL | Status: DC

## 2011-02-16 MED ORDER — ONDANSETRON HCL 4 MG PO TABS: 4.0000 mg | ORAL_TABLET | Freq: Four times a day (QID) | ORAL | Status: DC | PRN

## 2011-02-16 MED ORDER — QUETIAPINE FUMARATE ER 50 MG PO TB24
100.0000 mg | ORAL_TABLET | Freq: Every day | ORAL | Status: DC
  Administered 2011-02-17: 100 mg via ORAL
  Filled 2011-02-16 (×4): qty 2

## 2011-02-16 MED ORDER — METOPROLOL TARTRATE 25 MG PO TABS
25.0000 mg | ORAL_TABLET | Freq: Two times a day (BID) | ORAL | Status: DC
  Filled 2011-02-16 (×3): qty 1

## 2011-02-16 MED ORDER — ONDANSETRON HCL 4 MG/2ML IJ SOLN: 4.0000 mg | Freq: Four times a day (QID) | INTRAMUSCULAR | Status: DC | PRN

## 2011-02-16 MED ORDER — OLANZAPINE 10 MG IM SOLR
5.0000 mg | Freq: Two times a day (BID) | INTRAMUSCULAR | Status: DC | PRN
  Administered 2011-02-20: 5 mg via INTRAMUSCULAR
  Filled 2011-02-16 (×3): qty 10

## 2011-02-16 MED ORDER — LORAZEPAM 1 MG PO TABS
1.0000 mg | ORAL_TABLET | Freq: Four times a day (QID) | ORAL | Status: DC | PRN
  Administered 2011-02-18 – 2011-02-25 (×4): 1 mg via ORAL
  Filled 2011-02-16 (×7): qty 1
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
## 2011-02-16 NOTE — ED Notes (Signed)
Pt continues to refuse to allow VS to be taken, writer in room requesting to take VS pt sits in a chair folds his arms and shakes his head no, angry demeanor/affect.

## 2011-02-16 NOTE — ED Notes (Signed)
Pt once again refuses to allow staff to obtain vital signs.  Resting in room.

## 2011-02-16 NOTE — ED Notes (Signed)
Pt administered Haldol 5 mg IM, allowed administration without any difficulties

## 2011-02-16 NOTE — ED Notes (Signed)
Pt continues to refuse PO meds/VS Dr. Rito Ehrlich informed as instructed
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
## 2011-02-16 NOTE — Progress Notes (Signed)
Dr. Rito Ehrlich text paged informed of pt here and of increased BP.

## 2011-02-16 NOTE — ED Notes (Signed)
Report given to Trina RN.

## 2011-02-16 NOTE — Progress Notes (Addendum)
CSW called staff at [facility] to discuss patient. Staff reports that patient has been there approximately 2-3 months and has refused medications off and on during his stay.  Staff reports that patient has been aggressive and threatening, and although they won't say he is unable to return, they will have to ensure his behaviors are stabilized, particularly at night prior to his return. CSW contacted [facility] who confirmed his MR diagnosis (moderate), and advised his last inpatient admission was in 1997. CSW contacted patient's brother, Wesley Sullivan, who advised patient has lived with him, prior to the group home placement. He stated his brother took his medications, however, has become too much for he and his wife to handle. Patient's brother also reported this being the first time his brother has voiced auditory (radio noise in ear) and tactile (feeling someone touch his legs) hallucinations.  CSW asked patient's brother to bring his IQ test results as this is needed for inpatient psych services. Patient's brother reported he will comply and bring information. Dr. Rogers Blocker reported patient will be admitted to med floor. CSW should contact patient's brother to advise when this occurs.  Ileene Hutchinson , MSW, LCSWA 02/16/2011 11:58 AM

## 2011-02-16 NOTE — BH Assessment (Signed)
Per notes from Dr. Rogers Blocker 12/26, patient is pending a medical admit/ Patient has and will remain in the psych ED without a pending disposition due to the eD physician and psychiatrist not agreeing on patients plan of care. As of 02/16/2011 16:18 patient will have remained in the ED 48 hrs without a plan of care.

## 2011-02-16 NOTE — ED Notes (Signed)
Care assumed, pt lying in bed with eyes closed
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
## 2011-02-16 NOTE — ED Notes (Signed)
Pt allowed administration of olanzapine 5mg  IM without resistance

## 2011-02-16 NOTE — ED Notes (Signed)
Pt allowed administration of Haldol IM 5 mg with minimal resistance, would not roll over to alternate sites.

## 2011-02-16 NOTE — BH Assessment (Signed)
Updated shift report and provided EDP with a copy. He asked if their was anything that he needed to know about. Writer informed the EDP that all patients were pending placement and evaluation by psychiatrist Dr. Rogers Blocker with the exception of Horton Marshall. Writer made Dr. Juleen China aware that this patient has remained in the ED almost 48 hrs with no plan of care or pending disposition. Dr. Marylen Ponto response was "Oh ok".  Writer made Dr. Rogers Blocker aware that patient is still sitting here in the ED. Dr. Levander Campion initiated a call to the hospitalist requesting med admit.   At this time patient is pending a med floor admit.

## 2011-02-17 LAB — HEMOGLOBIN A1C
Hgb A1c MFr Bld: 5.2 % (ref <5.7)
Mean Plasma Glucose: 103 mg/dL (ref <117)

## 2011-02-17 MED ORDER — CLONIDINE HCL 0.1 MG/24HR TD PTWK
0.1000 mg | MEDICATED_PATCH | TRANSDERMAL | Status: DC
  Administered 2011-02-17 – 2011-03-21 (×3): 0.1 mg via TRANSDERMAL
  Filled 2011-02-17 (×8): qty 1

## 2011-02-17 NOTE — Progress Notes (Deleted)
Pt continues to refuse ekg  And refused most of the am assessment.

## 2011-02-17 NOTE — Progress Notes (Signed)
CSW contacted patient's brother to advise him of patient's inpt status.  Ileene Hutchinson , MSW, LCSWA 02/17/2011 8:22 AM [phone]

## 2011-02-17 NOTE — Progress Notes (Signed)
Pt assist to bathroom.  Then he ask for the tv to be turned on.  After turning the tv on pt turned to the side away from me and yelled "turn it off" .  Refused to allow dietary to leave breakfast tray.  Swings arms about if spoken to.

## 2011-02-17 NOTE — Progress Notes (Signed)
Pt took dilantin but continues to refuse meals, vs check and most meds.

## 2011-02-17 NOTE — Progress Notes (Signed)
Pt refused ekg and refused most of the am assessment.

## 2011-02-17 NOTE — Progress Notes (Signed)
UR completed 

## 2011-02-17 NOTE — Progress Notes (Signed)
Subjective: Pt alert, very hard of hearing. Per nsg still refusing PO meds today but calm,  small amount of PO intake Objective: Vital signs in last 24 hours: Temp:  [97.8 F (36.6 C)] 97.8 F (36.6 C) (12/27 1820) Pulse Rate:  [67-112] 112  (12/27 1820) Resp:  [16-20] 20  (12/27 1820) BP: (101-129)/(69-106) 129/106 mmHg (12/27 1820) SpO2:  [98 %-100 %] 100 % (12/27 1820) Weight:  [56.564 kg (124 lb 11.2 oz)] 124 lb 11.2 oz (56.564 kg) (12/27 1820)   Intake/Output from previous day:   Intake/Output this shift:      General Appearance:    Alert, cooperative when what needs to be done is demonstrated to him, very hard of hearing  Lungs:     Clear to auscultation bilaterally, respirations unlabored   Heart:    Regular rate and rhythm, S1 and S2 normal, no murmur, rub   or gallop  Abdomen:     Soft, non-tender, bowel sounds active all four quadrants,    no masses, no organomegaly  Extremities:   Extremities normal, atraumatic, no cyanosis or edema       Weight change:  No intake or output data in the 24 hours ending 02/17/11 1035  Lab Results:   [facility] 02/14/11 1655  NA 138  K 3.2*  CL 101  CO2 27  GLUCOSE 115*  BUN 11  CREATININE 0.91  CALCIUM 9.5    Basename 02/14/11 1655  WBC 5.5  HGB 13.8  HCT 40.7  PLT 230  MCV 90.6   PT/INR No results found for this basename: LABPROT:2,INR:2 in the last 72 hours ABG No results found for this basename: PHART:2,PCO2:2,PO2:2,HCO3:2 in the last 72 hours  Micro Results: No results found for this or any previous visit (from the past 240 hour(s)). Studies/Results: No results found. Medications: Scheduled Meds:   . carbamazepine  200 mg Oral BID  . cloNIDine  0.1 mg Transdermal Weekly  . cloNIDine  0.1 mg Oral Once  . haloperidol lactate  5 mg Intramuscular NOW  . levETIRAcetam  500 mg Oral Q12H  . metoprolol  5 mg Intravenous NOW  . OLANZapine  5 mg Intramuscular Once  . OLANZapine zydis  5 mg Oral BID  .  phenytoin  100 mg Oral TID  . QUEtiapine  100 mg Oral QHS  . DISCONTD: Melatonin  1 capsule Oral Daily  . DISCONTD: metoprolol tartrate  25 mg Oral BID  . DISCONTD: QUEtiapine  400 mg Oral QHS  . DISCONTD: traZODone  100 mg Oral QHS   Continuous Infusions:  PRN Meds:.haloperidol lactate, LORazepam, OLANZapine, ondansetron (ZOFRAN) IV, ondansetron Assessment/Plan: Patient Active Hospital Problem List: Elevated blood pressure reading without diagnosis of hypertension (02/16/2011) -BP still uncontrolled, start clonidine patch since still not taking his po meds today, follow   Psychosis (02/16/2011) -per psyc, continue current meds Hypokalemia -follow recheck k in am.  LOS: 3 days   Marilyn Wing C 02/17/2011, 10:35 AM

## 2011-02-18 DIAGNOSIS — F79 Unspecified intellectual disabilities: Secondary | ICD-10-CM

## 2011-02-18 MED ORDER — OLANZAPINE 10 MG PO TBDP
10.0000 mg | ORAL_TABLET | Freq: Every day | ORAL | Status: DC
  Administered 2011-02-18 – 2011-03-08 (×6): 10 mg via ORAL
  Filled 2011-02-18 (×36): qty 1

## 2011-02-18 NOTE — Progress Notes (Signed)
Subjective: She remains alert and calm, tolerating by mouth well today at. Still refuses to take his medications except for the Dilantin today- noted he refused the po Zyprexa today. He refused his vital signs measurement as well.  Objective: Vital signs in last 24 hours:   Last BM Date: 02/17/11 Intake/Output from previous day:   Intake/Output this shift:      General Appearance:    Alert, very hard of hearing, verbally responsive today but speech very difficult to understand.  Lungs:     Clear to auscultation bilaterally, respirations unlabored   Heart:    Regular rate and rhythm, S1 and S2 normal, no murmur, rub   or gallop  Abdomen:     Soft, non-tender, bowel sounds active all four quadrants,    no masses, no organomegaly  Extremities:   Extremities normal, atraumatic, no cyanosis or edema       Weight change:   Intake/Output Summary (Last 24 hours) at 02/18/11 2003 Last data filed at 02/18/11 1712  Gross per 24 hour  Intake    720 ml  Output      0 ml  Net    720 ml    Lab Results:  No results found for this basename: NA:2,K:2,CL:2,CO2:2,GLUCOSE:2,BUN:2,CREATININE:2,CALCIUM:2 in the last 72 hours No results found for this basename: WBC:2,HGB:2,HCT:2,PLT:2,MCV:2 in the last 72 hours PT/INR No results found for this basename: LABPROT:2,INR:2 in the last 72 hours ABG No results found for this basename: PHART:2,PCO2:2,PO2:2,HCO3:2 in the last 72 hours  Micro Results: No results found for this or any previous visit (from the past 240 hour(s)). Studies/Results: No results found. Medications: Scheduled Meds:    . carbamazepine  200 mg Oral BID  . cloNIDine  0.1 mg Transdermal Weekly  . cloNIDine  0.1 mg Oral Once  . levETIRAcetam  500 mg Oral Q12H  . OLANZapine zydis  10 mg Oral QHS  . phenytoin  100 mg Oral TID  . DISCONTD: OLANZapine zydis  5 mg Oral BID  . DISCONTD: QUEtiapine  100 mg Oral QHS   Continuous Infusions:  PRN Meds:.haloperidol lactate,  LORazepam, OLANZapine, ondansetron (ZOFRAN) IV, ondansetron Assessment/Plan: Patient Active Hospital Problem List: Elevated blood pressure reading without diagnosis of hypertension (02/16/2011) As above no vital signs available as patient refused a, unable to further adjust meds for now.   Psychosis (02/16/2011) -per psyc appreciate the assistance, follow Hypokalemia No labs done as patient refused .  LOS: 4 days   Genny Caulder C 02/18/2011, 8:03 PM

## 2011-02-18 NOTE — Progress Notes (Signed)
Pt woke up approximately 0230 am came out into the hallway yelling and screaming at staff. Patient would go back and forth from bed to hallway yelling and screaming incomprehensible words.  Multiple staff members attempted to deescalate the patient without success.  Patient given IM dose of haldol x1.  Patient has refused to have vital signs taken and lab work drawn.  Will continue to monitor patient close.

## 2011-02-18 NOTE — Plan of Care (Signed)
Problem: Phase I Progression Outcomes Goal: Pain controlled with appropriate interventions Outcome: Completed/Met Date Met:  02/18/11 Pt denies pain

## 2011-02-18 NOTE — Progress Notes (Signed)
Pt has refused all meds today.

## 2011-02-18 NOTE — Consult Note (Signed)
Reason for Consult:psychiatry follow up Referring Physician:   [name] is an 70 y.o. male.  HPI: patient was seen for psychiatric follow up. He has been calm and poorly cooperative. He has not agitated or aggressive. He has been confused, staring instead of verbally responding to queries. He has been on medication seroquel without much positive response and will be titration with Zyprexa, which tolerated without abnormal involuntary movements or akathesia.   Past Medical History  Diagnosis Date  . Cerebral palsy   . Mental retardation   . Diabetes mellitus   . Dementia   . Seizures   . Gastroesophageal reflux disease   . Acute renal failure   . Constipation   . Speech impairment   . Hypertension   . Anemia     History reviewed. No pertinent past surgical history.  No family history on file.  Social History:  reports that he has never smoked. He has never used smokeless tobacco. He reports that he does not drink alcohol or use illicit drugs.  Allergies: No Known Allergies  Medications: I have reviewed the patient's current medications.  Results for orders placed during the hospital encounter of 02/14/11 (from the past 48 hour(s))  HEMOGLOBIN A1C     Status: Normal   Collection Time   02/17/11  5:05 AM      Component Value Range Comment   Hemoglobin A1C 5.2  <5.7 (%)    Mean Plasma Glucose 103  <117 (mg/dL)     No results found.  No depression and No anxiety Blood pressure 129/106, pulse 112, temperature 97.8 F (36.6 C), temperature source Oral, resp. rate 20, height 5\' 7"  (1.702 m), weight 56.564 kg (124 lb 11.2 oz), SpO2 100.00%.   Assessment/Plan: Dementia NOS with psychosis  Will discontinue Seroquel and increase Zyprexa to 10 mg QD and will continue PRN medications    Wesley Sullivan,Wesley Sullivan. 02/18/2011, 11:39 AM

## 2011-02-19 NOTE — Progress Notes (Signed)
Pt. Is fully dressed and is sitting on the bench in his room, waiting for his brother . He thinks his brother is coming to pick him up and take him home with him. Pt refuses to have me do an assessment on him. Appears to not want to be touched. Pt is HOH,and is very difficult to understand. I did crush some of his meds and put them in chocolate ice cream.

## 2011-02-19 NOTE — Progress Notes (Signed)
Subjective:]  Dressed up and sitting on chair- per nsg he is waiting for his brother to pick him up, per nsg took med in chocolate pudding today but o/w refused his food.refused labs Objective: Vital signs in last 24 hours: Temp:  [98.7 F (37.1 C)] 98.7 F (37.1 C) (12/29 2213) Pulse Rate:  [84] 84  (12/29 2213) Resp:  [20] 20  (12/29 2213) BP: (141)/(94) 141/94 mmHg (12/29 2213) SpO2:  [96 %] 96 % (12/29 2213) Last BM Date: 02/17/11 Intake/Output from previous day: 12/29 0701 - 12/30 0700 In: 720 [P.O.:720] Out: -  Intake/Output this shift:      General Appearance:    Alert, in no distress  Lungs:     Clear to auscultation bilaterally, respirations unlabored   Heart:    Regular rate and rhythm, S1 and S2 normal, no murmur, rub   or gallop  Abdomen:     Soft, non-tender, bowel sounds active all four quadrants,    no masses, no organomegaly  Extremities:   Extremities normal, atraumatic, no cyanosis or edema       Weight change:   Intake/Output Summary (Last 24 hours) at 02/19/11 1334 Last data filed at 02/18/11 1712  Gross per 24 hour  Intake    480 ml  Output      0 ml  Net    480 ml    Lab Results:  No results found for this basename: NA:2,K:2,CL:2,CO2:2,GLUCOSE:2,BUN:2,CREATININE:2,CALCIUM:2 in the last 72 hours No results found for this basename: WBC:2,HGB:2,HCT:2,PLT:2,MCV:2 in the last 72 hours PT/INR No results found for this basename: LABPROT:2,INR:2 in the last 72 hours ABG No results found for this basename: PHART:2,PCO2:2,PO2:2,HCO3:2 in the last 72 hours  Micro Results: No results found for this or any previous visit (from the past 240 hour(s)). Studies/Results: No results found. Medications: Scheduled Meds:    . carbamazepine  200 mg Oral BID  . cloNIDine  0.1 mg Transdermal Weekly  . cloNIDine  0.1 mg Oral Once  . levETIRAcetam  500 mg Oral Q12H  . OLANZapine zydis  10 mg Oral QHS  . phenytoin  100 mg Oral TID   Continuous Infusions:    PRN Meds:.haloperidol lactate, LORazepam, OLANZapine, ondansetron (ZOFRAN) IV, ondansetron Assessment/Plan: Patient Active Hospital Problem List: HTN bp improving,Continue clonidine patches,    Psychosis (02/16/2011) - on zyprex and cabamazepine appreciate psych assistance, follow  Hypokalemia No labs done to f/u as patient refused .  LOS: 5 days   Babbie Dondlinger C 02/19/2011, 1:34 PM

## 2011-02-20 MED ORDER — DIVALPROEX SODIUM 250 MG PO DR TAB
250.0000 mg | DELAYED_RELEASE_TABLET | Freq: Two times a day (BID) | ORAL | Status: DC
  Administered 2011-02-21 – 2011-03-16 (×9): 250 mg via ORAL
  Filled 2011-02-20 (×66): qty 1

## 2011-02-20 NOTE — Progress Notes (Signed)
UR completed 

## 2011-02-20 NOTE — Consult Note (Signed)
  Wesley Sullivan is a 70 y.o. male [id number] 05-22-1940  Subjective/Objective:  Patient is still agitated on and off going to other patient's rooms intrusive with nurses. But he does not seem to be hallucinating or delusional he is not suicidal or homicidal. No abnormal movements noted.  Patient is on a dilantin and Tegretol for seizure disorder I will recommend instead of these 2 medications if one can be replaced with Depakote that will help both for mood stability and seizure disorder.  Filed Vitals:   02/18/11 2213  BP: 141/94  Pulse: 84  Resp: 20    Lab Results:   BMET    Component Value Date/Time   NA 138 02/14/2011 1655   K 3.2* 02/14/2011 1655   CL 101 02/14/2011 1655   CO2 27 02/14/2011 1655   GLUCOSE 115* 02/14/2011 1655   BUN 11 02/14/2011 1655   CREATININE 0.91 02/14/2011 1655   CALCIUM 9.5 02/14/2011 1655   GFRNONAA 84* 02/14/2011 1655   GFRAA >90 02/14/2011 1655    Medications:  Scheduled:     . carbamazepine  200 mg Oral BID  . cloNIDine  0.1 mg Transdermal Weekly  . cloNIDine  0.1 mg Oral Once  . levETIRAcetam  500 mg Oral Q12H  . OLANZapine zydis  10 mg Oral QHS  . phenytoin  100 mg Oral TID     PRN Meds haloperidol lactate, LORazepam, OLANZapine, ondansetron (ZOFRAN) IV, ondansetron  Assessment/Plan:  Continue with current treatment plan and see recommendations as above.  Wesley Sullivan S MD 02/20/2011

## 2011-02-20 NOTE — Progress Notes (Signed)
Subjective:]  C/o about food, still fully dressed and sitting on chair. Objective: Vital signs in last 24 hours: Temp:  [97.6 F (36.4 C)-97.9 F (36.6 C)] 97.9 F (36.6 C) (12/31 2218) Pulse Rate:  [65-91] 91  (12/31 2218) Resp:  [18] 18  (12/31 2218) BP: (126-137)/(84-89) 126/84 mmHg (12/31 2218) SpO2:  [91 %] 91 % (12/31 2218) Last BM Date: 02/17/11 Intake/Output from previous day: 12/30 0701 - 12/31 0700 In: 720 [P.O.:720] Out: -  Intake/Output this shift:      General Appearance:    Alert, in no distress  Lungs:     Clear to auscultation bilaterally, respirations unlabored   Heart:    Regular rate and rhythm, S1 and S2 normal, no murmur, rub   or gallop  Abdomen:     Soft, non-tender, bowel sounds active all four quadrants,    no masses, no organomegaly  Extremities:   Extremities normal, atraumatic, no cyanosis or edema       Weight change:   Intake/Output Summary (Last 24 hours) at 02/20/11 2220 Last data filed at 02/20/11 0830  Gross per 24 hour  Intake    360 ml  Output      0 ml  Net    360 ml    Lab Results:  No results found for this basename: NA:2,K:2,CL:2,CO2:2,GLUCOSE:2,BUN:2,CREATININE:2,CALCIUM:2 in the last 72 hours No results found for this basename: WBC:2,HGB:2,HCT:2,PLT:2,MCV:2 in the last 72 hours PT/INR No results found for this basename: LABPROT:2,INR:2 in the last 72 hours ABG No results found for this basename: PHART:2,PCO2:2,PO2:2,HCO3:2 in the last 72 hours  Micro Results: No results found for this or any previous visit (from the past 240 hour(s)). Studies/Results: No results found. Medications: Scheduled Meds:    . carbamazepine  200 mg Oral BID  . cloNIDine  0.1 mg Transdermal Weekly  . cloNIDine  0.1 mg Oral Once  . levETIRAcetam  500 mg Oral Q12H  . OLANZapine zydis  10 mg Oral QHS  . 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  Hypokalemia No labs done to f/u as patient refused .  LOS: 6 days   Dave Mergen C 02/20/2011, 10:20 PM

## 2011-02-21 LAB — BASIC METABOLIC PANEL
CO2: 29 mEq/L (ref 19–32)
Chloride: 104 mEq/L (ref 96–112)
Potassium: 3.8 mEq/L (ref 3.5–5.1)
Sodium: 140 mEq/L (ref 135–145)
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
## 2011-02-21 NOTE — Progress Notes (Signed)
At shift change, pt pacing halls, cooperative, pleasant. Around 2100 pt started to become agitated, yelling and screaming at staff and other patients' visitors. Multiple attempts by all staff to deescalate the patient without success. Administered IM dose of haldol x 1. Pt continued to pace hall and yell at staff. Pt refused all scheduled medications. At 2245, patient became more agitated than before. IM dose of zyprexa administered. Will continue to monitor closely.

## 2011-02-21 NOTE — Progress Notes (Signed)
Subjective:]  Per nsg knowingly took some po meds today, still noted to wonder to other pt's rooms. Still refusing lab draws Objective: Vital signs in last 24 hours: Temp:  [97.6 F (36.4 C)-97.9 F (36.6 C)] 97.7 F (36.5 C) (01/01 0618) Pulse Rate:  [65-91] 81  (01/01 0618) Resp:  [18] 18  (01/01 0618) BP: (124-137)/(73-89) 124/73 mmHg (01/01 0618) SpO2:  [91 %-98 %] 98 % (01/01 0618) Last BM Date: 02/17/11 Intake/Output from previous day: 12/31 0701 - 01/01 0700 In: 360 [P.O.:360] Out: -  Intake/Output this shift: Total I/O In: 360 [P.O.:360] Out: -     General Appearance:    Alert, in no distress, very hard of hearing.  Lungs:     Clear to auscultation bilaterally, respirations unlabored   Heart:    Regular rate and rhythm, S1 and S2 normal, no murmur, rub   or gallop  Abdomen:     Soft, non-tender, bowel sounds active all four quadrants,    no masses, no organomegaly  Extremities:   Extremities normal, atraumatic, no cyanosis or edema       Weight change:   Intake/Output Summary (Last 24 hours) at 02/21/11 1127 Last data filed at 02/21/11 0830  Gross per 24 hour  Intake    360 ml  Output      0 ml  Net    360 ml    Lab Results:   Basename 02/21/11 0548  NA 140  K 3.8  CL 104  CO2 29  GLUCOSE 94  BUN 16  CREATININE 0.88  CALCIUM 9.0   No results found for this basename: WBC:2,HGB:2,HCT:2,PLT:2,MCV:2 in the last 72 hours PT/INR No results found for this basename: LABPROT:2,INR:2 in the last 72 hours ABG No results found for this basename: PHART:2,PCO2:2,PO2:2,HCO3:2 in the last 72 hours  Micro Results: No results found for this or any previous visit (from the past 240 hour(s)). Studies/Results: No results found. Medications: Scheduled Meds:    . cloNIDine  0.1 mg Transdermal Weekly  . cloNIDine  0.1 mg Oral Once  . divalproex  250 mg Oral Q12H  . levETIRAcetam  500 mg Oral Q12H  . OLANZapine zydis  10 mg Oral QHS  . phenytoin  100 mg Oral  TID  . DISCONTD: carbamazepine  200 mg Oral BID   Continuous Infusions:  PRN Meds:.haloperidol lactate, LORazepam, OLANZapine, ondansetron (ZOFRAN) IV, ondansetron Assessment/Plan: Patient Active Hospital Problem List: HTN bp improved,Continue clonidine patches,.   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  Hypokalemia No labs done as he continues to refuse labs .  LOS: 7 days   Jonel Sick C 02/21/2011, 11:27 AM

## 2011-02-21 NOTE — Progress Notes (Signed)
Pt resting comfortably in bed. Will continue to monitor.

## 2011-02-22 LAB — BASIC METABOLIC PANEL
CO2: 28 mEq/L (ref 19–32)
GFR calc non Af Amer: 88 mL/min — ABNORMAL LOW (ref >90)
Glucose, Bld: 90 mg/dL (ref 70–99)
Potassium: 4.2 mEq/L (ref 3.5–5.1)
Sodium: 138 mEq/L (ref 135–145)

## 2011-02-22 NOTE — Progress Notes (Signed)
Spoke with Delorise Shiner at [place].  Notified Delorise Shiner that Pt refusing EKG and chest Xray.  Per Delorise Shiner, Pt has to have these before Pt can be accepted.  Notified psych MD.  [name], LCSWA Clinical Social Work [phone]

## 2011-02-22 NOTE — Progress Notes (Signed)
Spoke to pt about ekg that was ordered and showed the ekg machine to the pt, refused to allow [place] to do the ekg.  Refused vital signs, meds this am, and ekg.  Spoke with the pt about possible chest xray refused all test.
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
## 2011-02-22 NOTE — Progress Notes (Signed)
  PCP: Dorrene German, MD, MD  Subjective: Patient thinks he is going home. Refusing to answer any questions.  Objective: Vital signs in last 24 hours: Temp:  [98.4 F (36.9 C)-98.7 F (37.1 C)] 98.4 F (36.9 C) (01/02 0615) Pulse Rate:  [86-90] 86  (01/02 0615) Resp:  [20] 20  (01/02 0615) BP: (130-132)/(76-80) 132/80 mmHg (01/02 0615) SpO2:  [97 %-98 %] 98 % (01/02 0615) Weight change:  Last BM Date: 02/21/11  Intake/Output from previous day: 01/01 0701 - 01/02 0700 In: 360 [P.O.:360] Out: -  Intake/Output this shift: Total I/O In: 360 [P.O.:360] Out: -   Patient refused to be examined.  Lab Results: No results found for this basename: WBC:2,HGB:2,HCT:2,PLT:2 in the last 72 hours BMET  [facility] 02/22/11 0519 02/21/11 0548  NA 138 140  K 4.2 3.8  CL 104 104  CO2 28 29  GLUCOSE 90 94  BUN 15 16  CREATININE 0.82 0.88  CALCIUM 9.2 9.0    Studies/Results: No results found.  Medications: I have reviewed the patient's current medications.  Assessment/Plan:  Principal Problem:  *Elevated blood pressure reading without diagnosis of hypertension Active Problems:  Psychosis  Assessment limited as patient refused to be examined.  1. HTN: BP is much improved. Continue current medications. 2. Psychosis: Per psyche. Attempts being made to place him in a psychiatric facility. Patient is medically stable. 3. DVT Prophylaxis: SCD's   LOS: 8 days   [facility] Pager [phone] 02/22/2011, 4:13 PM

## 2011-02-22 NOTE — Progress Notes (Signed)
Psych MD considering [facility] for Pt.  Requested that CSW make collateral contact.  Left messages for Pt's NOK, Saunders Glance, at both numbers listed.  [name], LCSWA Clinical Social Work [phone]

## 2011-02-22 NOTE — Progress Notes (Signed)
Refuses meds and refuses to let me scan his bractlet.
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
## 2011-02-24 DIAGNOSIS — I10 Essential (primary) hypertension: Secondary | ICD-10-CM | POA: Diagnosis present

## 2011-02-24 NOTE — Progress Notes (Signed)
Psych MD feels that a group home is an appropriate placement for Pt.  Notified MD.  CSW to meet with brother and Pt today to discuss placement.  [name], LCSWA Clinical Social Work [phone]
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
## 2011-02-24 NOTE — Progress Notes (Signed)
LM for Pt's brother, Ivar Drape, regarding his tour of [facility].  LM for Gwenette Greet, Interior and spatial designer of [facility].  [name], LCSWA Clinical Social Work [phone]

## 2011-02-24 NOTE — Progress Notes (Signed)
Notified psych MD about brother procuring a group home placement for Pt.  Psych MD to evaluate Pt to determine if a group home is an appropriate placement.  [name], LCSWA Clinical Social Work [phone]

## 2011-02-24 NOTE — Progress Notes (Signed)
LM on Pt's brother cell phone re: his impressions of [facility] and if he'd like for Pt to d/c to that facility.  [name], LCSWA Clinical Social Work [phone]

## 2011-02-24 NOTE — Progress Notes (Signed)
Pt refuses all medications this am.

## 2011-02-24 NOTE — Progress Notes (Addendum)
Met with Pt, Pt's brother and HCPOA, Ivar Drape, and [facility] representative.  Group home in need of documentation of Pt's MR and his medications.  Group home will need PCP to complete several state documents prior to admission into their facility.  Brother to obtain these documents from group home and take them to PCP for completion and signature.  Group home will contact LME to determine if Pt is appropriate for services.  Group home anticipates admission next week.  At [facility] request, CSW provided group home with Pt's med list H&P and psych evals.   CSW to continue to follow.  [name], LCSWA Clinical Social Work [phone]

## 2011-02-24 NOTE — Consult Note (Signed)
Reason for Consult:Evaluation of disposition Referring Physician: Dr. Lodema Hong is an 71 y.o. male.  HPI: Multisystem Disease  Past Medical History  Diagnosis Date  . Cerebral palsy   . Mental retardation   . Diabetes mellitus   . Dementia   . Seizures   . Gastroesophageal reflux disease   . Acute renal failure   . Constipation   . Speech impairment   . Hypertension   . Anemia     History reviewed. No pertinent past surgical history.  No family history on file.  Social History:  reports that he has never smoked. He has never used smokeless tobacco. He reports that he does not drink alcohol or use illicit drugs.  Allergies: No Known Allergies  Medications: I have reviewed the patient's current medications.  No results found for this or any previous visit (from the past 48 hour(s)).  No results found.  Review of Systems  Unable to perform ROS: mental acuity   Blood pressure 144/83, pulse 68, temperature 97.9 F (36.6 C), temperature source Oral, resp. rate 18, height 5\' 7"  (1.702 m), weight 56.564 kg (124 lb 11.2 oz), SpO2 97.00%. Physical Exam  Assessment/Plan:  This patient is fully dressed.  He has a drooping upper lip and appears to have no upper teeth.  He is calm and has good eye contact. He is dysarthric and most times is unable [to understand] or answer the questions.  He says his 'baby brother' is coming and he names himself and his sister's name. He can barely pronounce each word.  Based upon this observation, lack of an ability to converse, a home environment to supervise his medications ADLs, and any acute psychiatric or medical problem.  Social worker, Stan Head., has summarize patients disposition options.  He does not have to go to the state hospital because brother has found an alternate residence for him.  The identified group home agrees with his transfer.  RECOMMEND: 1. Patient is declared ready for discharge:  Group home with supervision  is preferred plan, arranged by brother.  Wesley Sullivan 02/24/2011, 3:28 PM

## 2011-02-24 NOTE — Progress Notes (Signed)
Spoke with Pt's brother who toured the group home and stated that he'd like for Pt to go there.  CSW to notify psych MD.  [name], LCSWA Clinical Social Work [phone]

## 2011-02-25 NOTE — Progress Notes (Signed)
Message from Pt's brother asking about Pt's insurance.  Provided brother with Pt's insurance information.  [name], LCSWA Clinical Social Work [phone]
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
## 2011-02-26 NOTE — Progress Notes (Signed)
Patient refused all scheduled 2200 medications this evening. He stated they were not the right medications.
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
## 2011-02-26 NOTE — Progress Notes (Signed)
Patient refused nursing assessment

## 2011-02-26 NOTE — Progress Notes (Signed)
Refused all AM meds-did take afternoon Dilantin.

## 2011-02-26 NOTE — Progress Notes (Deleted)
Refused all AM meds but did take afternoon Dilantin.

## 2011-02-27 NOTE — Progress Notes (Signed)
Pt refused medications and for his vital signs to be taken. Pt became upset and stepped out in hall and yelled at staff. RN and tech were able to deescalate pt and get him to return to his room. Will continue to monitor.
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
## 2011-02-27 NOTE — Progress Notes (Signed)
UR COMPLETED.MED STABLE.AWAITING GROUP HOME.SW FOLLOWING.

## 2011-02-27 NOTE — Progress Notes (Signed)
He has been quite recalcitrant and resistant to my care today as he (rightly) perceived that I had mixed his meds with his ice cream.  He chose to become and remain upset about this all day.  He is, however healthy-looking in appearance and had freely and without difficulty walked on our unit all day with some of the other staff offering him snacks which he ate.  When a food-staff worker attempted to bring him a lunch tray, he sent her away with a flurry of profanity, utterly refusing to eat lunch.  He does allow the tech. To bring him water and other drinks also.  He also appears clean and is in no wise malodorous.

## 2011-02-28 MED ORDER — DIVALPROEX SODIUM 250 MG PO DR TAB: 250.0000 mg | DELAYED_RELEASE_TABLET | Freq: Two times a day (BID) | ORAL | Status: DC

## 2011-02-28 MED ORDER — OLANZAPINE 10 MG PO TBDP: 10.0000 mg | ORAL_TABLET | Freq: Every day | ORAL | Status: AC

## 2011-02-28 MED ORDER — LEVETIRACETAM 500 MG PO TABS: 500.0000 mg | ORAL_TABLET | Freq: Two times a day (BID) | ORAL | Status: DC

## 2011-02-28 MED ORDER — CLONIDINE HCL 0.1 MG/24HR TD PTWK: 1.0000 | MEDICATED_PATCH | TRANSDERMAL | Status: DC

## 2011-02-28 MED ORDER — PHENYTOIN SODIUM EXTENDED 100 MG PO CAPS: 100.0000 mg | ORAL_CAPSULE | Freq: Three times a day (TID) | ORAL | Status: DC

## 2011-02-28 MED ORDER — LORAZEPAM 1 MG PO TABS: 1.0000 mg | ORAL_TABLET | Freq: Three times a day (TID) | ORAL | Status: DC | PRN
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
## 2011-02-28 NOTE — Progress Notes (Signed)
Spoke with Chrissy at [employer].  Per Prentice Docker, Mr. Chaloux, Pt's brother, picked up paperwork from her for PCP yesterday.  Brother doesn't have Pt's M'caid card, but has the number.  Chrissy will try to use the number but encouraged brother to request Pt's card.  Spoke with Mr. Leiker stated that he dropped off the documents for PCP yesterday evening and that PCP to call him today when these are ready.  Brother requested a new M'caid card and was informed that this card is issued by [place], thus, it will take several days to receive.  Mr. Slayden stated that the group home is requesting a copy of Pt's psychological test score that show Pt's IQ.  Mr. Meng left a message today at [facility] requesting a copy of this.  CSW offered to be of assistance in any way.  Mr. Biegler stated that he will seek CSW's help if he doesn't hear back from [facility] today.  CSW to continue to follow.  [name], LCSWA Clinical Social Work [phone]

## 2011-03-01 NOTE — Progress Notes (Signed)
Pt refused all pm medications but did allow RN to assess.  Will continue to monitor pt. Jon Billings

## 2011-03-01 NOTE — Progress Notes (Signed)
Chart review.  Noted that, per MD, Pt ready for d/c.  Spoke with MD.  MD stated  that if brother feels that he is able to care for Pt until the group home is ready to receive Pt, then it's ok for Pt to d/c into brother's care.  Spoke with brother.  Brother stated that he will attempt to obtain a sitter to be with Pt during the day.  CSW offered to contact PCP.  Brother provided CSW with PCP's contact number:[phone].  Called PCP.  Office closed for lunch.  [name], LCSWA Clinical Social Work [phone]

## 2011-03-01 NOTE — Progress Notes (Signed)
CSW continues to attempt contact PCP.  Excessive hold time.  CSW to continue to reach PCP.  [name], LCSWA Clinical Social Work [phone]

## 2011-03-01 NOTE — Progress Notes (Signed)
Pt's brother, Jamen Loiseau, unable to procure a sitter for Pt today.  Brother will try tomorrow.  Notified MD.  CSW unsuccessful at contacting PCP.  Either no one answers the phone or CSW is excessively on hold.  CSW to f/u with PCP and Ivar Drape tomorrow.  [name], LCSWA Clinical Social Work [phone]

## 2011-03-01 NOTE — Progress Notes (Signed)
Patient discussed at the Long Length of Stay Wesley Sullivan Weeks 03/01/2011  

## 2011-03-01 NOTE — Progress Notes (Addendum)
  Subjective: Patient seen and examined,denies any complaints   Objective: Vital signs in last 24 hours: Temp:  [97.1 F (36.2 C)-98.7 F (37.1 C)] 97.1 F (36.2 C) (01/09 1402) Pulse Rate:  [65-78] 65  (01/09 1402) Resp:  [18] 18  (01/09 1402) BP: (130-136)/(68-72) 130/69 mmHg (01/09 1402) SpO2:  [97 %-100 %] 100 % (01/09 1402) Weight change:  Last BM Date: 02/25/11  Intake/Output from previous day: 01/08 0701 - 01/09 0700 In: 960 [P.O.:960] Out: -  Total I/O In: 720 [P.O.:720] Out: -    Physical Exam: General: Alert, awake, in no acute distress. Heart: Regular rate and rhythm, without murmurs, rubs, gallops. Lungs: Clear to auscultation bilaterally. Abdomen: Soft, nontender, nondistended, positive bowel sounds. Extremities: No clubbing cyanosis or edema with positive pedal pulses. Neuro: Grossly intact, nonfocal.    Lab Results: No results found for this or any previous visit (from the past 24 hour(s)).  Studies/Results: No results found.  Medications:    . cloNIDine  0.1 mg Transdermal Weekly  . cloNIDine  0.1 mg Oral Once  . divalproex  250 mg Oral Q12H  . levETIRAcetam  500 mg Oral Q12H  . OLANZapine zydis  10 mg Oral QHS  . phenytoin  100 mg Oral TID    haloperidol lactate, LORazepam, OLANZapine, ondansetron (ZOFRAN) IV, ondansetron     Assessment/Plan:  Principal Problem:  *HTN (hypertension) Active Problems:  Psychosis Plan; Chart reviewed ,patient seen and examined ,stable for discharge .Discussed with SW ,apparently he is awaiting d/c to a group home ,however his PCP needs to fill up some paper work which is still pending and brother is willing to take him home and provide a Comptroller .Patient can be discharged home with brother  today if arrangements for a sitter are made  And can be transitioned to group home when the necessary paper work is finalized. I called Dr Asburey's office at [phone] ,however office was closed ,I left a message in his  voice mail for him to call me back .   LOS: 15 days   Nury Nebergall 03/01/2011, 3:21 PM

## 2011-03-01 NOTE — Progress Notes (Signed)
Spoke with Chrissy at [employer].  She still hasn't received the documents from the PCP.  Chrissy stated that she will need the M'caid card to complete the MR2.  She suggested to Mr. Linch that he contact Pt's SW at [facility] and ask if they can, on [place], state that Pt does M'caid and write his M'caid number.  Spoke with Mr. Anzalone, Pt's brother.  He contacted Pt's SW at [facility] and she will be able to provide the information the group home needs on letterhead.  Mr. Pollack stated that he called the PCP yesterday and left a message regarding the documents he needs them to complete.  He will call again today and/or go by the office.  CSW offered to assist in any way.  [name], LCSWA Clinical Social Work [phone]

## 2011-03-02 NOTE — Progress Notes (Signed)
Per Saunders Glance, Pt's brother, brother to attempt to pick up document from PCP's office and bring to [facility] for MD signature.  According to brother, MD not currently in office at this time and no one in the facility has a key to [employer] personal office.  CSW to continue to follow.  [name], LCSWA Clinical Social Work [phone]

## 2011-03-02 NOTE — Progress Notes (Signed)
Subjective: Patient seen and examined, has no complaints.  walks in the hallways from time to time. In no distress  Objective: Vital signs in last 24 hours: Temp:  [98 F (36.7 C)] 98 F (36.7 C) (01/10 1426) Pulse Rate:  [55-68] 66  (01/10 1426) Resp:  [18] 18  (01/10 1426) BP: (113-128)/(70-86) 113/70 mmHg (01/10 1426) SpO2:  [100 %] 100 % (01/10 1426) Weight change:  Last BM Date: 02/25/11 (pt unable to answer)  Intake/Output from previous day: 01/09 0701 - 01/10 0700 In: 960 [P.O.:960] Out: -      Physical Exam: General: Alert, awake, in no acute distress.  Heart: Regular rate and rhythm, without murmurs, rubs, gallops.  Lungs: Clear to auscultation bilaterally.  Abdomen: Soft, nontender, nondistended, positive bowel sounds.  Extremities: No clubbing cyanosis or edema with positive pedal pulses.  Neuro: Grossly intact, nonfocal.     Lab Results: No results found for this or any previous visit (from the past 24 hour(s)).  Studies/Results: No results found.  Medications:    . cloNIDine  0.1 mg Transdermal Weekly  . cloNIDine  0.1 mg Oral Once  . divalproex  250 mg Oral Q12H  . levETIRAcetam  500 mg Oral Q12H  . OLANZapine zydis  10 mg Oral QHS  . phenytoin  100 mg Oral TID    haloperidol lactate, LORazepam, OLANZapine, ondansetron (ZOFRAN) IV, ondansetron     Assessment/Plan:  Principal Problem:  *HTN (hypertension)  Active Problems:  Psychosis  Plan;  Chart reviewed ,patient seen and examined ,stable for discharge .Discussed with SW ,apparently he is awaiting d/c to a group home ,however his PCP needs to fill up some paper work which is still pending and brother is willing to take him home and provide a Comptroller .Patient can be discharged home with brother  if arrangements for a sitter are made And can be transitioned to group home when the necessary paper work is finalized.  I called Dr Asburey's office at [phone] yesterday ,however office was closed ,I  left a message in his voice mail for him to call me back . I did  not receive call back yet. Social worker is following. We'll attempt call  him again in the a.m.      LOS: 16 days   Layman Gully 03/02/2011, 7:42 PM

## 2011-03-02 NOTE — Progress Notes (Signed)
LM for Pt's PCP.  Spoke with Chrissy, [facility] Rep.  Chrissy stated that she is not in her office today, thus she can't provide me with the form.  She will attempt to contact someone from her facility and get them to pick up the form from the PCP.  Chrissy explained that the group home has to complete parts of the document and that she has a copy of what she completed but that this copy is in her office and no one has access to her office.  Chrissy to call CSW back.  [name], LCSWA Clinical Social Work [phone]

## 2011-03-02 NOTE — Progress Notes (Addendum)
T/c from [place], Care Coordinator on 5E.  Per Sheran Spine contacted her and was inquiring as to the reason that Pt continues to be hospitalized.  Mr. Tinnie Gens stating that Pt needs to be d/c'd today.    Contacted supervisor, [employer].  Discussed case with Hope.  Hope suggested that CSW contact Gray Bernhardt and discuss the barriers to d/c with him.  Additionally, Hope suggested that MD attempt to contact PCP to expedite the completion of the MR2.  Also, Hope suggested that CSW contact the group home to see if they can fax [place] the MR2 document for Pt's attending MD here to complete.  Spoke with Mr. Tinnie Gens and discussed the case, as well as the barriers to d/c.  Mr. Tinnie Gens was pleased with the attempts to address the barriers and stated that, until there is a safe d/c plan for Pt, it is understandable that he remain hospitalized.  CSW provided MD with PCP's information.  Per MD, the office was closed.  She left a message on the answering machine and provided her pager #.  T/c from [place], Care Coordinator on 5E.  Per Aram Beecham, there are no d/c orders for Pt.  Discussed with Aram Beecham my conversation with Mr. Tinnie Gens.  Pt to remain in the hospital until he has a safe d/c.  [name], LCSWA Clinical Social Work [phone]

## 2011-03-03 NOTE — Progress Notes (Signed)
Please refer  to my note dated 03/02/11 Patient seen and examined, stable and not in any distress. Chart reviewed, patient is medically stable for discharge. Discharge summary is ready. Social worker is working on finding a safe discharge plan for this patient, please refer to Child psychotherapist notes for more details.

## 2011-03-03 NOTE — Progress Notes (Addendum)
Spoke with Pt's brother.  Brother stated that he is going to pick up the signed MR2 from PCP today and provide it to the group home.  Brother stated that, per Chrissy at the group home, Pt can be admitted on Monday.  Attempted to call Chrissy.  Text from Chrissy stating that she's in a class today.  Text to Chrissy explaining that brother is to pick up signed MR2 today and provide it to her facility.  Asked Chrissy what the next step will be.  Text from Chrissy stating that, once they receive this form, she will turn it in and wait for prior approval.  Text to Chrissy asking how long obtaining prior approval will take.  Text from Chrissy stating that she doesn't know for sure but that it usually takes no more than 72 hrs.  Notified MD.  MD feels that brother should attempt, again, to find a sitter for Pt; he needs to be d/c'd.  LM for brother.  Discussed case with Zack.  Zack suggested CSW explore other group homes for Pt, in the off chance that [facility] doesn't accept Pt: [facility] and [facility].  [name], LCSWA Clinical Social Work [phone]

## 2011-03-03 NOTE — Progress Notes (Signed)
LM for brother.  [name], LCSWA Clinical Social Work [phone]

## 2011-03-04 NOTE — Progress Notes (Signed)
Patient seen ,alert and watching TV ,not in distress,refusing exam .chart reviewed ,patient remained medically stable for discharge. SW exploring options ,see SW notes .

## 2011-03-05 NOTE — Progress Notes (Signed)
Patient seen ,alert and walking around ,not in distress ,not cooperative and refusing exam .medically stable. Awaiting a safe discharge plan ,SW is working hard to establish an appropriate disposition for this patient .

## 2011-03-06 NOTE — Progress Notes (Signed)
   CARE MANAGEMENT NOTE 03/06/2011  Patient:  Wesley Sullivan, Wesley Sullivan   Account Number:  [id number]  Date Initiated:  02/20/2011  Documentation initiated by:  Raiford Noble  Subjective/Objective Assessment:   PT ADM WITH HTN     Action/Plan:   [facility] FOLLOWING; FROM [facility]   Anticipated DC Date:  03/08/2011   Anticipated DC Plan:  GROUP HOME  In-house referral  Clinical Social Worker         Choice offered to / List presented to:             Status of service:  In process, will continue to follow Medicare Important Message given?   (If response is "NO", the following Medicare IM given date fields will be blank) Date Medicare IM given:   Date Additional Medicare IM given:    Discharge Disposition:    Per UR Regulation:  Reviewed for med. necessity/level of care/duration of stay  Comments:  03/06/11 Adjoa Althouse RN,BSN NCM [phone] MED STABLE. AWAITING GROUP HOME-SW FOLLOWING. 02/27/11 Tamaj Jurgens RN,BSN,NCM [phone] MED STABLE,AWAITING GROUP HOME-PAPER WORK.SW FOLLOWING.  02/24/11 Shiza Thelen RN,BSN NCM [phone] MED STABLE AWAITING IP PSYCH/GROUP HOME.SW FOLLOWING. 02-20-11 [name], RN,BSN. [phone] Spoke with patient's brother, Josef Tourigny [phone] who states pt was from ALF, [facility]. States pt ALF informed him that they can no longer take care of patient due to his increased agitation and so forth. Psych making med adjustments. CSW following.

## 2011-03-06 NOTE — Progress Notes (Signed)
Patient seen, not answering questions and refusing exams. Chart reviewed, patient is medically stable for discharge. Social worker is investigating options, please refer to Child psychotherapist notes.

## 2011-03-06 NOTE — Progress Notes (Signed)
LM for Pt's brother.  Spoke with Chrissy from [employer].  Informed that she turned the documents in to Rachel Moulds at the [facility] on Friday but that he was out of the office on that day.  She provided me with Al Frye's direct line: [phone].  Attempted to contact Rachel Moulds. His voicemail states that he is out of the office until the 17th.  Contacted the [facility].  Told to call [phone] and ask to speak to [employer] supervisor.  Called [phone] and informed that Al Frye's supervisor is [employer] and he can be reached at [phone] (which happens to be the [facility] number).  Called the [facility], again, and asked to speak to [employer].  [facility] notified CSW that Mr. Alinda Deem is in their [facility] and that they are having trouble transferring calls to the [facility], as it's perpetually busy. She provided me with the [facility]'s direct #: [phone].  CSW called this number while still on with the [facility] rep.  The number was busy.  After explaining again what was needed, the [facility] rep suggested that CSW call the [facility] again.    While on hold with the [facility], attempted to call the [facility].  Number busy.  After being on the phone with the [facility] for almost an hour, it was suggested that CSW e-mail Al Ganey regarding the matter.  E-mailed Al Ganey.  CC'd supervisor, [employer].  CSW to continue to follow.  [name], LCSWA Clinical Social Work [phone]

## 2011-03-06 NOTE — Progress Notes (Signed)
Pt refused most of the am assessment, however took all am meds except depakote.    Wesley Sullivan 03/06/2011 11:12 AM

## 2011-03-06 NOTE — Progress Notes (Addendum)
Spoke with Pt's brother.  Brother to try to obtain a sitter for Pt, as CSW explained that the worker assigned to Pt's case from the [facility] is out of the office until the 17th.  His supervisor is only reachable by e-mail and hasn't yet responded to the e-mail sent this a.m.  LM for Chrissy at [facility].  Spoke with Chrissy.  Informed that Mr. Abran Cantor is the only person who can sign off on the documents, as he is the representative for them.  Chrissy to e-mail Mr. Abran Cantor to see if he's had a chance to look at the document.  Contacted [employer].  Instructed to call Sabrina at [phone].   Spoke with [place].  Discussed the situation with her.  Martie Lee suggested that CSW go a step about [employer] sales representative and speak with Miguel Aschoff at [phone].  LM for Miguel Aschoff.  [name], LCSWA Clinical Social Work [phone]

## 2011-03-07 NOTE — Progress Notes (Signed)
   CARE MANAGEMENT NOTE 03/07/2011  Patient:  Wesley Sullivan, Wesley Sullivan   Account Number:  [id number]  Date Initiated:  02/24/2011  Documentation initiated by:  Lanier Clam  Subjective/Objective Assessment:   ADMITTED W/HTN.ZO:XWRUEA RETARDATION.     Action/Plan:   FROM SNF   Anticipated DC Date:  03/09/2011   Anticipated DC Plan:  GROUP HOME  In-house referral  Clinical Social Worker         Choice offered to / List presented to:             Status of service:  In process, will continue to follow Medicare Important Message given?   (If response is "NO", the following Medicare IM given date fields will be blank) Date Medicare IM given:   Date Additional Medicare IM given:    Discharge Disposition:    Per UR Regulation:  Reviewed for med. necessity/level of care/duration of stay  Comments:  03/07/11 Poonam Woehrle RN,BSN NCM [phone] EXPLORED HHC AS AN OPTION-INTERMITTENT CARE, NOT ROUND THE CLOCK-COMPANION SERVICES.CONTACTED SENIOR RESOURCES LINE [phone] ABOUT PROCESS FOR PCS SERVICES-WOULD NEED DSS [phone] AUTH/ASSESSMENT VIA INTAKE WHICH TAKES ABOUT 10DAYS.[employer]-PROVIDE CNA LEVEL OF CARE.CSW NOTIFIED TO CONTINUE CURRENT PLAN GROUP HOME.  02/24/11 Lolitha Tortora RN,BSN NCM [phone] MED STABLE PER MD,AWAITING IP PSYCH, NOW SW EXPLORING ?GROUP HOME.PATIENT REFUSING EKG,CXR NEEDED FOR IP PSYCH.

## 2011-03-07 NOTE — Progress Notes (Signed)
Refused most of the am assessment and am depakote. Pt is now asleep. Currently awaiting to hear from social work regarding d/c.  William Dalton Lajoi 03/07/2011 11:55 AM

## 2011-03-07 NOTE — Progress Notes (Signed)
   CARE MANAGEMENT NOTE 03/07/2011  Patient:  Wesley Sullivan, Wesley Sullivan   Account Number:  [id number]  Date Initiated:  02/20/2011  Documentation initiated by:  Raiford Noble  Subjective/Objective Assessment:   PT ADM WITH HTN     Action/Plan:   [facility] FOLLOWING; [facility]   Anticipated DC Date:  03/09/2011   Anticipated DC Plan:  GROUP HOME  In-house referral  Clinical Social Worker         Choice offered to / List presented to:             Status of service:  In process, will continue to follow Medicare Important Message given?   (If response is "NO", the following Medicare IM given date fields will be blank) Date Medicare IM given:   Date Additional Medicare IM given:    Discharge Disposition:    Per UR Regulation:  Reviewed for med. necessity/level of care/duration of stay  Comments:  03/07/11 Tiena Manansala RN,BSN NCM [phone] EXPLORED HHC AS AN OPTION-INTERMITTENT CARE, NOT ROUND THE CLOCK-COMPANION SERVICES.CONTACTED SENIOR RESOURCES LINE [phone] ABOUT PROCESS FOR PCS SERVICES-WOULD NEED DSS [phone] AUTH/ASSESSMENT VIA INTAKE WHICH TAKES ABOUT 10DAYS.[employer]-PROVIDE CNA LEVEL OF CARE.CSW NOTIFIED TO CONTINUE CURRENT PLAN GROUP HOME.  03/06/11 Alen Matheson RN,BSN NCM [phone] MED STABLE. AWAITING GROUP HOME-SW FOLLOWING. 02/27/11 Lesette Frary RN,BSN,NCM [phone] MED STABLE,AWAITING GROUP HOME-PAPER WORK.SW FOLLOWING.  02/24/11 Maher Shon RN,BSN NCM [phone] MED STABLE AWAITING IP PSYCH/GROUP HOME.SW FOLLOWING. 02-20-11 [name], RN,BSN. [phone] Spoke with patient's brother, Vinayak Bobier [phone] who states pt was from ALF, [facility]. States pt ALF informed him that they can no longer take care of patient due to his increased agitation and so forth. Psych making med adjustments. CSW following.

## 2011-03-07 NOTE — Progress Notes (Addendum)
LM for Pt's brother, Marjorie Lussier.  LM #2 for Miguel Aschoff, administrator at [facility], [phone].  [name], LCSWA Clinical Social Work [phone]
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
## 2011-03-08 NOTE — Progress Notes (Signed)
Per supervisor, discuss with family possible placement at [facility], contact name is Annice Pih, with Dr. Harvie Junior as MD contact.  Spoke with Pt's brother.  Brother ok to send Pt's information to facility.  LM for [place].  CSW to continue to follow.  [name], LCSWA Clinical Social Work [phone]

## 2011-03-08 NOTE — Progress Notes (Signed)
Pt seen and examined, continues to be stable for discharge for 8 days now, awaiting safe disposition, D/W Child psychotherapist, hope to DC to group home tomorrow  Wesley Sullivan [facility] [phone]

## 2011-03-08 NOTE — Progress Notes (Signed)
Spoke with Miguel Aschoff from [facility].  Discuss situation with Mrs. Orvan Falconer.  Mrs. Orvan Falconer to contact Chrissy at [employer] to discuss case.  She will call back.  T/c from Mrs. Orvan Falconer.  Mrs. Orvan Falconer spoke with Chrissy who will fax the documents to her by 11.  Mrs. Orvan Falconer states that she will sign off on it and give it to the [facility] in [place] but that the [facility] has up to 5 days to approve Pt.  T/c, again, from Mrs. Orvan Falconer.  Mrs. Orvan Falconer stated that she spoke to Chrissy again and has been informed that Pt's brother has yet to provide the group home with a psychological evaluation; [facility] will not process the document without a psychological evaluation.  CSW to continue to follow.  [name], LCSWA Clinical Social Work [phone]

## 2011-03-08 NOTE — Progress Notes (Signed)
Patient discussed at the Long Length of Stay Wesley Sullivan Weeks 03/08/2011  

## 2011-03-09 MED ORDER — IPRATROPIUM BROMIDE 0.02 % IN SOLN
RESPIRATORY_TRACT | Status: AC
  Filled 2011-03-09: qty 2.5

## 2011-03-09 NOTE — Progress Notes (Signed)
   CARE MANAGEMENT NOTE 03/09/2011  Patient:  Wesley, Sullivan   Account Number:  [id number]  Date Initiated:  02/20/2011  Documentation initiated by:  Raiford Noble  Subjective/Objective Assessment:   PT ADM WITH HTN     Action/Plan:   [facility] FOLLOWING; [facility]   Anticipated DC Date:  03/13/2011   Anticipated DC Plan:  GROUP HOME  In-house referral  Clinical Social Worker         Choice offered to / List presented to:             Status of service:  In process, will continue to follow Medicare Important Message given?   (If response is "NO", the following Medicare IM given date fields will be blank) Date Medicare IM given:   Date Additional Medicare IM given:    Discharge Disposition:    Per UR Regulation:  Reviewed for med. necessity/level of care/duration of stay  Comments:  03/09/11 Wesley Scali RN,BSN, NCM [phone] CONTINUE TO FOLLOW TO ASST W/D/C PLANS.  03/07/11 Wesley Stege RN,BSN NCM [phone] EXPLORED HHC AS AN OPTION-INTERMITTENT CARE, NOT ROUND THE CLOCK-COMPANION SERVICES.CONTACTED SENIOR RESOURCES LINE [phone] ABOUT PROCESS FOR PCS SERVICES-WOULD NEED DSS [phone] AUTH/ASSESSMENT VIA INTAKE WHICH TAKES ABOUT 10DAYS.[employer]-PROVIDE CNA LEVEL OF CARE.CSW NOTIFIED TO CONTINUE CURRENT PLAN GROUP HOME.  03/06/11 Wesley Aiken RN,BSN NCM [phone] MED STABLE. AWAITING GROUP HOME-SW FOLLOWING. 02/27/11 Wesley Richens RN,BSN,NCM [phone] MED STABLE,AWAITING GROUP HOME-PAPER WORK.SW FOLLOWING.  02/24/11 Wesley Vigil RN,BSN NCM [phone] MED STABLE AWAITING IP PSYCH/GROUP HOME.SW FOLLOWING. 02-20-11 Wesley HALL, RN,BSN. [phone] Spoke with patient's brother, Wesley Sullivan [phone] who states pt was from ALF, [facility]. States pt ALF informed him that they can no longer take care of patient due to his increased agitation and so forth. Psych making med adjustments. CSW following.

## 2011-03-09 NOTE — Discharge Summary (Signed)
This is an addendum to the discharge summary as dictated by Dr. Osvaldo Shipper dictated on 02/27/10 Wesley Sullivan's hospital course has been prolonged awaiting appropriate disposition, Otherwise remains stable and on the medications as noted in the DC summary  Zannie Cove, MD [facility]

## 2011-03-09 NOTE — Progress Notes (Addendum)
Spoke with an intake specialists at [facility].  Informed that Annice Pih is off today.  Explained the situation regarding Pt.  Informed that they do not accept any referral information if they are full, and they're currently full.  The representative provided CSW with a pager number: [phone].  CSW was instructed to call that number tomorrow to inquire about bed status.  Also given the direct line number: [phone].  CSW to continue to follow.  [name], LCSWA Clinical Social Work [phone]

## 2011-03-10 ENCOUNTER — Other Ambulatory Visit: Payer: Self-pay

## 2011-03-10 ENCOUNTER — Inpatient Hospital Stay (HOSPITAL_COMMUNITY): Payer: Medicare Other

## 2011-03-10 NOTE — Progress Notes (Signed)
Spoke with brother.  Brother ok with Pt's info sent to [facility] psych facility.  Spoke with [employer].  Among other things, Pt needs to have a recent chest X ray, EKG and labs.  Notified MD.  MD to order check X ray, EKG and labs.  CSW to continue to follow.  [name], LCSWA Clinical Social Work [phone]

## 2011-03-10 NOTE — Progress Notes (Signed)
Spoke with Wesley Sullivan.  Per Wesley Sullivan, no beds, at this time.  Wesley Sullivan doesn't anticipate any beds until next week.  CSW to continue to follow.  [name], LCSWA Clinical Social Work [phone]

## 2011-03-10 NOTE — Progress Notes (Signed)
No beds available at [facility].  LM for [place].  Faxed requested information to [phone].  CSW to continue to follow.  [name], LCSWA Clinical Social Work [phone]

## 2011-03-11 NOTE — Progress Notes (Signed)
Spoke with Pt's brother, who was leaving the floor.  Brother notified CSW that the psychologist evaluated Pt earlier today and is to provide the group home with the results by Monday.  Explained to brother that the group home then has to submit this to Rachel Moulds at [facility] and Rachel Moulds then submits it to [facility]  The [facility] then has up to 5 days to approve him.  Explained that Pt is on the waiting list at [facility] and that they suspect they'll have a bed on Monday.  Told brother that CSW will be in touch when a bed is available and that he, at that point, can decide to bring Pt home or accept the placement at [facility].  Brother stated that he understood and that he hates to have to move Pt several times.  He stated that he will weigh his options, if a bed at [facility] is available prior to the group home accepting Pt.  [name], LCSWA Clinical Social Work [phone]

## 2011-03-11 NOTE — Progress Notes (Signed)
Spoke with Amil Amen at [facility].  Per Amil Amen, no beds will be available over the weekend but they anticipate a bed available on Monday.  CSW to continue to follow.'  [name], [facility] Clinical Social Work [phone]

## 2011-03-11 NOTE — Progress Notes (Signed)
Pt seen and examined, Exam unremarkable, Still awaiting bed at Group home Please see DC summary from 02/28/11

## 2011-03-12 MED ORDER — LIP MEDEX EX OINT
TOPICAL_OINTMENT | CUTANEOUS | Status: DC | PRN
  Filled 2011-03-12: qty 7

## 2011-03-12 MED ORDER — MENTHOL 3 MG MT LOZG
1.0000 | LOZENGE | OROMUCOSAL | Status: DC | PRN
  Filled 2011-03-12: qty 9

## 2011-03-12 NOTE — Progress Notes (Signed)
Subjective: Patient per nursing c/o dry lips and sore throat.  Objective: Vital signs in last 24 hours: Filed Vitals:   03/11/11 0621 03/11/11 1514 03/11/11 2153 03/12/11 0548  BP: 122/83 120/61 115/75 120/65  Pulse: 63 68 60 84  Temp: 98 F (36.7 C) 97.8 F (36.6 C) 98.6 F (37 C) 98.3 F (36.8 C)  TempSrc: Oral Oral Oral Oral  Resp: 18 18 18 18   Height:      Weight:      SpO2: 99% 96% 99% 95%    Intake/Output Summary (Last 24 hours) at 03/12/11 1608 Last data filed at 03/12/11 0900  Gross per 24 hour  Intake    480 ml  Output      0 ml  Net    480 ml    Weight change:   General: NAD Heart: Regular rate and rhythm, without murmurs, rubs, gallops. Lungs: Clear to auscultation bilaterally. Abdomen: Soft, nontender, nondistended, positive bowel sounds. Extremities: No clubbing cyanosis or edema with positive pedal pulses.   Lab Results: No results found for this basename: NA:2,K:2,CL:2,CO2:2,GLUCOSE:2,BUN:2,CREATININE:2,CALCIUM:2,MG:2,PHOS:2 in the last 72 hours No results found for this basename: AST:2,ALT:2,ALKPHOS:2,BILITOT:2,PROT:2,ALBUMIN:2 in the last 72 hours No results found for this basename: LIPASE:2,AMYLASE:2 in the last 72 hours No results found for this basename: WBC:2,NEUTROABS:2,HGB:2,HCT:2,MCV:2,PLT:2 in the last 72 hours No results found for this basename: CKTOTAL:3,CKMB:3,CKMBINDEX:3,TROPONINI:3 in the last 72 hours No components found with this basename: POCBNP:3 No results found for this basename: DDIMER:2 in the last 72 hours No results found for this basename: HGBA1C:2 in the last 72 hours No results found for this basename: CHOL:2,HDL:2,LDLCALC:2,TRIG:2,CHOLHDL:2,LDLDIRECT:2 in the last 72 hours No results found for this basename: TSH,T4TOTAL,FREET3,T3FREE,THYROIDAB in the last 72 hours No results found for this basename: VITAMINB12:2,FOLATE:2,FERRITIN:2,TIBC:2,IRON:2,RETICCTPCT:2 in the last 72 hours  Micro Results: No results found for this  or any previous visit (from the past 240 hour(s)).  Studies/Results: No results found.  Medications:     . cloNIDine  0.1 mg Transdermal Weekly  . cloNIDine  0.1 mg Oral Once  . divalproex  250 mg Oral Q12H  . levETIRAcetam  500 mg Oral Q12H  . OLANZapine zydis  10 mg Oral QHS  . phenytoin  100 mg Oral TID    Assessment: Principal Problem:  *HTN (hypertension) Active Problems:  Psychosis   Plan: #1. HTN   Controlled on current regimen of clonidine patch and oral clonidine. #2. Psychosis Improved. Refuses medications sometimes, however cleared by psych.Continue depakote and zyprexa. #3. Seizure disorder Stable. Continue depakote and keppra and phenytoin #4. Mental Retardation Stable #5. DMII Diet controlled #6. Dispo: Awaiting placement in group home. See D/C summary per Dr Barnie Del 02/28/11.     LOS: 26 days   THOMPSON,DANIEL 03/12/2011, 4:08 PM

## 2011-03-13 NOTE — Progress Notes (Signed)
Patient refusing v/s monitoring & meds (Zyprexa & Depakote). Patient was up most of the night paranoid, walking up & down the halls with verbal outburst.  Bennetta Laos, RN

## 2011-03-13 NOTE — Progress Notes (Signed)
Subjective: Patient with no complaints.  Objective: Vital signs in last 24 hours: Filed Vitals:   03/11/11 1514 03/11/11 2153 03/12/11 0548 03/13/11 1515  BP: 120/61 115/75 120/65 116/73  Pulse: 68 60 84 64  Temp: 97.8 F (36.6 C) 98.6 F (37 C) 98.3 F (36.8 C) 98.6 F (37 C)  TempSrc: Oral Oral Oral Oral  Resp: 18 18 18 16   Height:      Weight:      SpO2: 96% 99% 95% 98%    Intake/Output Summary (Last 24 hours) at 03/13/11 1552 Last data filed at 03/12/11 1700  Gross per 24 hour  Intake    240 ml  Output      0 ml  Net    240 ml    Weight change:   General: NAD Heart: Regular rate and rhythm, without murmurs, rubs, gallops. Lungs: Clear to auscultation bilaterally. Abdomen: Soft, nontender, nondistended, positive bowel sounds. Extremities: No clubbing cyanosis or edema with positive pedal pulses.   Lab Results: No results found for this basename: NA:2,K:2,CL:2,CO2:2,GLUCOSE:2,BUN:2,CREATININE:2,CALCIUM:2,MG:2,PHOS:2 in the last 72 hours No results found for this basename: AST:2,ALT:2,ALKPHOS:2,BILITOT:2,PROT:2,ALBUMIN:2 in the last 72 hours No results found for this basename: LIPASE:2,AMYLASE:2 in the last 72 hours No results found for this basename: WBC:2,NEUTROABS:2,HGB:2,HCT:2,MCV:2,PLT:2 in the last 72 hours No results found for this basename: CKTOTAL:3,CKMB:3,CKMBINDEX:3,TROPONINI:3 in the last 72 hours No components found with this basename: POCBNP:3 No results found for this basename: DDIMER:2 in the last 72 hours No results found for this basename: HGBA1C:2 in the last 72 hours No results found for this basename: CHOL:2,HDL:2,LDLCALC:2,TRIG:2,CHOLHDL:2,LDLDIRECT:2 in the last 72 hours No results found for this basename: TSH,T4TOTAL,FREET3,T3FREE,THYROIDAB in the last 72 hours No results found for this basename: VITAMINB12:2,FOLATE:2,FERRITIN:2,TIBC:2,IRON:2,RETICCTPCT:2 in the last 72 hours  Micro Results: No results found for this or any previous visit  (from the past 240 hour(s)).  Studies/Results: No results found.  Medications:     . cloNIDine  0.1 mg Transdermal Weekly  . cloNIDine  0.1 mg Oral Once  . divalproex  250 mg Oral Q12H  . levETIRAcetam  500 mg Oral Q12H  . OLANZapine zydis  10 mg Oral QHS  . phenytoin  100 mg Oral TID    Assessment: Principal Problem:  *HTN (hypertension) Active Problems:  Psychosis   Plan: #1. HTN   Controlled on current regimen of clonidine patch and oral clonidine. #2. Psychosis Improved. Refuses medications sometimes, however cleared by psych.Continue depakote and zyprexa. #3. Seizure disorder Stable. Continue depakote and keppra and phenytoin #4. Mental Retardation Stable #5. DMII Diet controlled #6. Dispo: Awaiting placement in group home. See D/C summary per Dr Barnie Del 02/28/11.     LOS: 27 days   Wesley Sullivan 03/13/2011, 3:52 PM

## 2011-03-14 NOTE — Progress Notes (Signed)
CSW met with Wesley Sullivan to complete MR2 form with MD signature. Wesley Sullivan will send MR2 and "psychological" to [facility] Cantor who will review and determine eligibility for [facility]. CSW attempted x3 to contact pt's brother, but have not received return call. Will continue to follow and assist with appropriate placement/discharge plan.  Dellie Burns, MSW, LCSWA [phone] (psych cover)

## 2011-03-14 NOTE — Progress Notes (Signed)
CSW spoke with Annice Pih in admissions at [facility] who reports Dr. Marshell Garfinkel has declined to accept pt as he appears to be psychiatrically stable and is more appropriate for group home setting. Spoke with Rachel Moulds at [facility] who reports he received ppw from [employer] account executive at [facility]) but that it was incomplete and insufficient for proof of MR. Left message for Chrissy [phone]) requesting return call to discuss ppw. Left message for pt's brother, Ivar Drape, to discuss other dispo options.  Dellie Burns, MSW, Theresia Majors [phone] (psych CSW cover)

## 2011-03-14 NOTE — Progress Notes (Signed)
Subjective: Patient with no complaints. Per nursing patient refusing some of his medications.  Objective: Vital signs in last 24 hours: Filed Vitals:   03/13/11 1515 03/13/11 2200 03/14/11 0700 03/14/11 1538  BP: 116/73 118/76 120/70 122/73  Pulse: 64 82 84 62  Temp: 98.6 F (37 C) 98.4 F (36.9 C) 98.2 F (36.8 C) 97.5 F (36.4 C)  TempSrc: Oral Oral Oral Oral  Resp: 16 18 16 18   Height:      Weight:      SpO2: 98% 98% 95% 95%    Intake/Output Summary (Last 24 hours) at 03/14/11 1924 Last data filed at 03/14/11 1725  Gross per 24 hour  Intake    840 ml  Output      0 ml  Net    840 ml    Weight change:   General: NAD Heart: Regular rate and rhythm, without murmurs, rubs, gallops. Lungs: Clear to auscultation bilaterally. Abdomen: Soft, nontender, nondistended, positive bowel sounds. Extremities: No clubbing cyanosis or edema with positive pedal pulses.   Lab Results: No results found for this basename: NA:2,K:2,CL:2,CO2:2,GLUCOSE:2,BUN:2,CREATININE:2,CALCIUM:2,MG:2,PHOS:2 in the last 72 hours No results found for this basename: AST:2,ALT:2,ALKPHOS:2,BILITOT:2,PROT:2,ALBUMIN:2 in the last 72 hours No results found for this basename: LIPASE:2,AMYLASE:2 in the last 72 hours No results found for this basename: WBC:2,NEUTROABS:2,HGB:2,HCT:2,MCV:2,PLT:2 in the last 72 hours No results found for this basename: CKTOTAL:3,CKMB:3,CKMBINDEX:3,TROPONINI:3 in the last 72 hours No components found with this basename: POCBNP:3 No results found for this basename: DDIMER:2 in the last 72 hours No results found for this basename: HGBA1C:2 in the last 72 hours No results found for this basename: CHOL:2,HDL:2,LDLCALC:2,TRIG:2,CHOLHDL:2,LDLDIRECT:2 in the last 72 hours No results found for this basename: TSH,T4TOTAL,FREET3,T3FREE,THYROIDAB in the last 72 hours No results found for this basename: VITAMINB12:2,FOLATE:2,FERRITIN:2,TIBC:2,IRON:2,RETICCTPCT:2 in the last 72 hours  Micro  Results: No results found for this or any previous visit (from the past 240 hour(s)).  Studies/Results: No results found.  Medications:     . cloNIDine  0.1 mg Transdermal Weekly  . cloNIDine  0.1 mg Oral Once  . divalproex  250 mg Oral Q12H  . levETIRAcetam  500 mg Oral Q12H  . OLANZapine zydis  10 mg Oral QHS  . phenytoin  100 mg Oral TID    Assessment: Principal Problem:  *HTN (hypertension) Active Problems:  Psychosis   Plan: #1. HTN   Controlled on current regimen of clonidine patch and oral clonidine. #2. Psychosis Improved. Refuses medications sometimes, however cleared by psych.Continue depakote and zyprexa. #3. Seizure disorder Stable. Continue depakote and keppra and phenytoin #4. Mental Retardation Stable #5. DMII Diet controlled #6. Dispo: Awaiting placement in group home. See D/C summary per Dr Barnie Del 02/28/11.     LOS: 28 days   Dana Debo 03/14/2011, 7:24 PM

## 2011-03-14 NOTE — Progress Notes (Signed)
Patient refused Keppra and Clonidine Patch, patient has been refusing medication on going for several days, Dr notified

## 2011-03-15 NOTE — Progress Notes (Signed)
Spoke with Chrissy at [facility].  Informed that all necessary paperwork has been submitted and that they are now awaiting approval, which can take up to one week.  CSW to continue to follow.  [name], LCSWA Clinical Social Work [phone]

## 2011-03-15 NOTE — Progress Notes (Signed)
Patient ID: Wesley Sullivan, male   DOB: 03/28/1940, 71 y.o.   MRN: [id number]  Subjective: No events overnight. Patient denies chest pain, shortness of breath, abdominal pain. Had bowel movement and reports ambulating.  Objective:  Vital signs in last 24 hours:  Filed Vitals:   03/13/11 2200 03/14/11 0700 03/14/11 1538 03/15/11 1345  BP: 118/76 120/70 122/73 129/73  Pulse: 82 84 62 61  Temp:  98.2 F (36.8 C) 97.5 F (36.4 C) 97.8 F (36.6 C)  TempSrc: Oral Oral Oral Oral  Resp: 18 16 18 18   Height:      Weight:      SpO2: 98% 95% 95% 100%    Intake/Output from previous day:   Intake/Output Summary (Last 24 hours) at 03/15/11 1607 Last data filed at 03/14/11 1725  Gross per 24 hour  Intake    360 ml  Output      0 ml  Net    360 ml    Physical Exam: General: Alert, awake, oriented x3, in no acute distress. HEENT: No bruits, no goiter. Moist mucous membranes, no scleral icterus, no conjunctival pallor. Heart: Regular rate and rhythm, S1/S2 +, no murmurs, rubs, gallops. Lungs: Clear to auscultation bilaterally. No wheezing, no rhonchi, no rales.  Abdomen: Soft, nontender, nondistended, positive bowel sounds. Extremities: No clubbing or cyanosis, no pitting edema,  positive pedal pulses. Neuro: Grossly nonfocal.  Lab Results:  Basic Metabolic Panel:    Component Value Date/Time   NA 138 02/22/2011 0519   K 4.2 02/22/2011 0519   CL 104 02/22/2011 0519   CO2 28 02/22/2011 0519   BUN 15 02/22/2011 0519   CREATININE 0.82 02/22/2011 0519   GLUCOSE 90 02/22/2011 0519   CALCIUM 9.2 02/22/2011 0519   CBC:    Component Value Date/Time   WBC 5.5 02/14/2011 1655   HGB 13.8 02/14/2011 1655   HCT 40.7 02/14/2011 1655   PLT 230 02/14/2011 1655   MCV 90.6 02/14/2011 1655   NEUTROABS 2.9 02/14/2011 1655   LYMPHSABS 2.0 02/14/2011 1655   MONOABS 0.7 02/14/2011 1655   EOSABS 0.0 02/14/2011 1655   BASOSABS 0.0 02/14/2011 1655     No results found for this basename:  WBC:5,HGB:5,HCT:5,PLT:5,MCV:5,MCH:5,MCHC:5,RDW:5,NEUTRABS:5,LYMPHSABS:5,MONOABS:5,EOSABS:5,BASOSABS:5,BANDABS:5,BANDSABD:5 in the last 168 hours No results found for this basename: NA:5,K:5,CL:5,CO2:5,GLUCOSE:5,BUN:5,CREATININE:5,CALCIUM:5,MG:5 in the last 168 hours No results found for this basename: INR:5,PROTIME:5 in the last 168 hours Cardiac markers: No results found for this basename: CK:3,CKMB:3,TROPONINI:3,MYOGLOBIN:3 in the last 168 hours No components found with this basename: POCBNP:3 No results found for this or any previous visit (from the past 240 hour(s)).  Studies/Results: No results found.  Medications: Scheduled Meds:   . cloNIDine  0.1 mg Transdermal Weekly  . cloNIDine  0.1 mg Oral Once  . divalproex  250 mg Oral Q12H  . levETIRAcetam  500 mg Oral Q12H  . OLANZapine zydis  10 mg Oral QHS  . phenytoin  100 mg Oral TID   Continuous Infusions:  PRN Meds:.haloperidol lactate, lip balm, LORazepam, menthol-cetylpyridinium, OLANZapine, ondansetron (ZOFRAN) IV, ondansetron  Assessment/Plan:  Principal Problem:  *HTN (hypertension) - stable, will continue the same medication regimen  Active Problems:  Psychosis - stable, pt awaiting bed - continue same medication regimen for now    EDUCATION - test results and diagnostic studies were discussed with patient  - patient verbalized the understanding - questions were answered at the bedside and contact information was provided for additional questions or concerns   LOS: 29 days   MAGICK-Wesley Sullivan  03/15/2011, 4:07 PM  [facility] Pager: [phone]

## 2011-03-16 NOTE — Progress Notes (Signed)
Patient ID: Wesley Sullivan, male   DOB: Nov 20, 1940, 71 y.o.   MRN: [id number]  Subjective: No events overnight. Patient denies chest pain, shortness of breath, abdominal pain. Had bowel movement and reports ambulating.  Objective:  Vital signs in last 24 hours:  Filed Vitals:   03/13/11 2200 03/14/11 0700 03/14/11 1538 03/15/11 1345  BP: 118/76 120/70 122/73 129/73  Pulse: 82 84 62 61  Temp:  98.2 F (36.8 C) 97.5 F (36.4 C) 97.8 F (36.6 C)  TempSrc: Oral Oral Oral Oral  Resp: 18 16 18 18   Height:      Weight:      SpO2: 98% 95% 95% 100%    Intake/Output from previous day:   Intake/Output Summary (Last 24 hours) at 03/16/11 1748 Last data filed at 03/16/11 1230  Gross per 24 hour  Intake    600 ml  Output      0 ml  Net    600 ml    Physical Exam: General: Alert, awake, in no acute distress. HEENT: No bruits, no goiter. Moist mucous membranes, no scleral icterus, no conjunctival pallor. Heart: Regular rate and rhythm, S1/S2 +, no murmurs, rubs, gallops. Lungs: Clear to auscultation bilaterally. No wheezing, no rhonchi, no rales.  Abdomen: Soft, nontender, nondistended, positive bowel sounds. Extremities: No clubbing or cyanosis, no pitting edema,  positive pedal pulses. Neuro: Grossly nonfocal.  Lab Results:  Basic Metabolic Panel:    Component Value Date/Time   NA 138 02/22/2011 0519   K 4.2 02/22/2011 0519   CL 104 02/22/2011 0519   CO2 28 02/22/2011 0519   BUN 15 02/22/2011 0519   CREATININE 0.82 02/22/2011 0519   GLUCOSE 90 02/22/2011 0519   CALCIUM 9.2 02/22/2011 0519   CBC:    Component Value Date/Time   WBC 5.5 02/14/2011 1655   HGB 13.8 02/14/2011 1655   HCT 40.7 02/14/2011 1655   PLT 230 02/14/2011 1655   MCV 90.6 02/14/2011 1655   NEUTROABS 2.9 02/14/2011 1655   LYMPHSABS 2.0 02/14/2011 1655   MONOABS 0.7 02/14/2011 1655   EOSABS 0.0 02/14/2011 1655   BASOSABS 0.0 02/14/2011 1655    Studies/Results: No results found.  Medications: Scheduled  Meds:   . cloNIDine  0.1 mg Transdermal Weekly  . cloNIDine  0.1 mg Oral Once  . divalproex  250 mg Oral Q12H  . levETIRAcetam  500 mg Oral Q12H  . OLANZapine zydis  10 mg Oral QHS  . phenytoin  100 mg Oral TID   Continuous Infusions:  PRN Meds:.haloperidol lactate, lip balm, LORazepam, menthol-cetylpyridinium, OLANZapine, ondansetron (ZOFRAN) IV, ondansetron  Assessment/Plan:  Principal Problem:  *HTN (hypertension)  - stable, will continue the same medication regimen   Active Problems:  Psychosis  - stable, pt awaiting bed  - continue same medication regimen for now   EDUCATION  - test results and diagnostic studies were discussed with patient  - patient verbalized the understanding  - questions were answered at the bedside and contact information was provided for additional questions or concerns    LOS: 30 days   MAGICK-Teagen Bucio 03/16/2011, 5:48 PM  [facility] Pager: [phone]

## 2011-03-17 NOTE — Progress Notes (Signed)
Patient ID: Wesley Sullivan, male   DOB: 1940/03/02, 71 y.o.   MRN: [id number]  Subjective: No events overnight. Patient denies chest pain, shortness of breath, abdominal pain. Had bowel movement and reports ambulating.  Objective:  Vital signs in last 24 hours:  Filed Vitals:   03/13/11 2200 03/14/11 0700 03/14/11 1538 03/15/11 1345  BP: 118/76 120/70 122/73 129/73  Pulse: 82 84 62 61  Temp:  98.2 F (36.8 C) 97.5 F (36.4 C) 97.8 F (36.6 C)  TempSrc: Oral Oral Oral Oral  Resp: 18 16 18 18   Height:      Weight:      SpO2: 98% 95% 95% 100%    Intake/Output from previous day:   Intake/Output Summary (Last 24 hours) at 03/17/11 1032 Last data filed at 03/17/11 0800  Gross per 24 hour  Intake   1080 ml  Output      0 ml  Net   1080 ml    Physical Exam: General: Alert, awake, in no acute distress. HEENT: No bruits, no goiter. Moist mucous membranes, no scleral icterus, no conjunctival pallor. Heart: Regular rate and rhythm, S1/S2 +, no murmurs, rubs, gallops. Lungs: Clear to auscultation bilaterally. No wheezing, no rhonchi, no rales.  Abdomen: Soft, nontender, nondistended, positive bowel sounds. Extremities: No clubbing or cyanosis, no pitting edema,  positive pedal pulses. Neuro: Grossly nonfocal.  Lab Results:  Basic Metabolic Panel:    Component Value Date/Time   NA 138 02/22/2011 0519   K 4.2 02/22/2011 0519   CL 104 02/22/2011 0519   CO2 28 02/22/2011 0519   BUN 15 02/22/2011 0519   CREATININE 0.82 02/22/2011 0519   GLUCOSE 90 02/22/2011 0519   CALCIUM 9.2 02/22/2011 0519   CBC:    Component Value Date/Time   WBC 5.5 02/14/2011 1655   HGB 13.8 02/14/2011 1655   HCT 40.7 02/14/2011 1655   PLT 230 02/14/2011 1655   MCV 90.6 02/14/2011 1655   NEUTROABS 2.9 02/14/2011 1655   LYMPHSABS 2.0 02/14/2011 1655   MONOABS 0.7 02/14/2011 1655   EOSABS 0.0 02/14/2011 1655   BASOSABS 0.0 02/14/2011 1655     No results found for this basename:  WBC:5,HGB:5,HCT:5,PLT:5,MCV:5,MCH:5,MCHC:5,RDW:5,NEUTRABS:5,LYMPHSABS:5,MONOABS:5,EOSABS:5,BASOSABS:5,BANDABS:5,BANDSABD:5 in the last 168 hours No results found for this basename: NA:5,K:5,CL:5,CO2:5,GLUCOSE:5,BUN:5,CREATININE:5,CALCIUM:5,MG:5 in the last 168 hours No results found for this basename: INR:5,PROTIME:5 in the last 168 hours Cardiac markers: No results found for this basename: CK:3,CKMB:3,TROPONINI:3,MYOGLOBIN:3 in the last 168 hours No components found with this basename: POCBNP:3 No results found for this or any previous visit (from the past 240 hour(s)).  Studies/Results: No results found.  Medications: Scheduled Meds:   . cloNIDine  0.1 mg Transdermal Weekly  . cloNIDine  0.1 mg Oral Once  . divalproex  250 mg Oral Q12H  . levETIRAcetam  500 mg Oral Q12H  . OLANZapine zydis  10 mg Oral QHS  . phenytoin  100 mg Oral TID   Continuous Infusions:  PRN Meds:.haloperidol lactate, lip balm, LORazepam, menthol-cetylpyridinium, OLANZapine, ondansetron (ZOFRAN) IV, ondansetron  Assessment/Plan:  Principal Problem:  *HTN (hypertension)  - stable, will continue the same medication regimen   Active Problems:  Psychosis  - stable, pt awaiting bed  - continue same medication regimen for now   EDUCATION  - test results and diagnostic studies were discussed with patient  - patient verbalized the understanding  - questions were answered at the bedside and contact information was provided for additional questions or concerns    LOS: 31 days  MAGICK-Cicero Noy 03/17/2011, 10:32 AM  [facility] Pager: [phone]

## 2011-03-17 NOTE — Progress Notes (Signed)
CSW continues to follow, still awaiting group home approval.

## 2011-03-17 NOTE — Progress Notes (Signed)
Patient refused medication this am but patient is in stable condition at this time,alert and oriented without signs of distress, Dr notified, ok with patient,s wishes to refuse medication

## 2011-03-18 NOTE — Progress Notes (Signed)
Patient ID: Wesley Sullivan, male   DOB: 05-12-1940, 71 y.o.   MRN: [id number]  Subjective: No events overnight. Patient denies chest pain, shortness of breath, abdominal pain. Had bowel movement and reports ambulating.  Objective:  Vital signs in last 24 hours:  Filed Vitals:   03/14/11 0700 03/14/11 1538 03/15/11 1345 03/18/11 0000  BP: 120/70 122/73 129/73   Pulse: 84 62 61   Temp: 98.2 F (36.8 C) 97.5 F (36.4 C) 97.8 F (36.6 C)   TempSrc: Oral Oral Oral Other (Comment)  Resp: 16 18 18    Height:      Weight:      SpO2: 95% 95% 100%     Intake/Output from previous day:   Intake/Output Summary (Last 24 hours) at 03/18/11 0848 Last data filed at 03/17/11 1200  Gross per 24 hour  Intake    240 ml  Output      0 ml  Net    240 ml    Physical Exam: General: Alert, awake, oriented x3, in no acute distress. HEENT: No bruits, no goiter. Moist mucous membranes, no scleral icterus, no conjunctival pallor. Heart: Regular rate and rhythm, S1/S2 +, no murmurs, rubs, gallops. Lungs: Clear to auscultation bilaterally. No wheezing, no rhonchi, no rales.  Abdomen: Soft, nontender, nondistended, positive bowel sounds. Extremities: No clubbing or cyanosis, no pitting edema,  positive pedal pulses. Neuro: Grossly nonfocal.  Lab Results:  Basic Metabolic Panel:    Component Value Date/Time   NA 138 02/22/2011 0519   K 4.2 02/22/2011 0519   CL 104 02/22/2011 0519   CO2 28 02/22/2011 0519   BUN 15 02/22/2011 0519   CREATININE 0.82 02/22/2011 0519   GLUCOSE 90 02/22/2011 0519   CALCIUM 9.2 02/22/2011 0519   CBC:    Component Value Date/Time   WBC 5.5 02/14/2011 1655   HGB 13.8 02/14/2011 1655   HCT 40.7 02/14/2011 1655   PLT 230 02/14/2011 1655   MCV 90.6 02/14/2011 1655   NEUTROABS 2.9 02/14/2011 1655   LYMPHSABS 2.0 02/14/2011 1655   MONOABS 0.7 02/14/2011 1655   EOSABS 0.0 02/14/2011 1655   BASOSABS 0.0 02/14/2011 1655     No results found for this basename:  WBC:5,HGB:5,HCT:5,PLT:5,MCV:5,MCH:5,MCHC:5,RDW:5,NEUTRABS:5,LYMPHSABS:5,MONOABS:5,EOSABS:5,BASOSABS:5,BANDABS:5,BANDSABD:5 in the last 168 hours No results found for this basename: NA:5,K:5,CL:5,CO2:5,GLUCOSE:5,BUN:5,CREATININE:5,CALCIUM:5,MG:5 in the last 168 hours No results found for this basename: INR:5,PROTIME:5 in the last 168 hours Cardiac markers: No results found for this basename: CK:3,CKMB:3,TROPONINI:3,MYOGLOBIN:3 in the last 168 hours No components found with this basename: POCBNP:3 No results found for this or any previous visit (from the past 240 hour(s)).  Studies/Results: No results found.  Medications: Scheduled Meds:   . cloNIDine  0.1 mg Transdermal Weekly  . cloNIDine  0.1 mg Oral Once  . divalproex  250 mg Oral Q12H  . levETIRAcetam  500 mg Oral Q12H  . OLANZapine zydis  10 mg Oral QHS  . phenytoin  100 mg Oral TID   Continuous Infusions:  PRN Meds:.haloperidol lactate, lip balm, LORazepam, menthol-cetylpyridinium, OLANZapine, ondansetron (ZOFRAN) IV, ondansetron  Assessment/Plan:  Principal Problem:  *HTN (hypertension)  - stable, will continue the same medication regimen   Active Problems:  Psychosis  - stable, pt awaiting bed  - continue same medication regimen for now   EDUCATION  - test results and diagnostic studies were discussed with patient  - patient verbalized the understanding  - questions were answered at the bedside and contact information was provided for additional questions or concerns  LOS: 32 days   MAGICK-Maribel Luis 03/18/2011, 8:48 AM  [facility] Pager: [phone]

## 2011-03-19 NOTE — Progress Notes (Signed)
Patient ID: Wesley Sullivan, male   DOB: 01-Dec-1940, 71 y.o.   MRN: [id number]  Subjective: No events overnight. Patient denies chest pain, shortness of breath, abdominal pain. Had bowel movement and reports ambulating.  Objective:  Vital signs in last 24 hours:  Filed Vitals:   03/18/11 1413 03/18/11 2200 03/19/11 0000 03/19/11 0600  BP: 117/67 125/74 125/74 126/72  Pulse: 65 60 63 62  Temp: 98.1 F (36.7 C) 98.1 F (36.7 C) 98.1 F (36.7 C) 98.4 F (36.9 C)  TempSrc: Oral Oral Oral Oral  Resp: 18 18 18 18   Height:      Weight:      SpO2: 99% 99% 99% 99%    Intake/Output from previous day:   Intake/Output Summary (Last 24 hours) at 03/19/11 0931 Last data filed at 03/18/11 1050  Gross per 24 hour  Intake    240 ml  Output      0 ml  Net    240 ml    Physical Exam: General: Alert, awake, oriented x3, in no acute distress. HEENT: No bruits, no goiter. Moist mucous membranes, no scleral icterus, no conjunctival pallor. Heart: Regular rate and rhythm, S1/S2 +, no murmurs, rubs, gallops. Lungs: Clear to auscultation bilaterally. No wheezing, no rhonchi, no rales.  Abdomen: Soft, nontender, nondistended, positive bowel sounds. Extremities: No clubbing or cyanosis, no pitting edema,  positive pedal pulses. Neuro: Grossly nonfocal.  Lab Results:  Basic Metabolic Panel:    Component Value Date/Time   NA 138 02/22/2011 0519   K 4.2 02/22/2011 0519   CL 104 02/22/2011 0519   CO2 28 02/22/2011 0519   BUN 15 02/22/2011 0519   CREATININE 0.82 02/22/2011 0519   GLUCOSE 90 02/22/2011 0519   CALCIUM 9.2 02/22/2011 0519   CBC:    Component Value Date/Time   WBC 5.5 02/14/2011 1655   HGB 13.8 02/14/2011 1655   HCT 40.7 02/14/2011 1655   PLT 230 02/14/2011 1655   MCV 90.6 02/14/2011 1655   NEUTROABS 2.9 02/14/2011 1655   LYMPHSABS 2.0 02/14/2011 1655   MONOABS 0.7 02/14/2011 1655   EOSABS 0.0 02/14/2011 1655   BASOSABS 0.0 02/14/2011 1655     No results found for this  basename: WBC:5,HGB:5,HCT:5,PLT:5,MCV:5,MCH:5,MCHC:5,RDW:5,NEUTRABS:5,LYMPHSABS:5,MONOABS:5,EOSABS:5,BASOSABS:5,BANDABS:5,BANDSABD:5 in the last 168 hours No results found for this basename: NA:5,K:5,CL:5,CO2:5,GLUCOSE:5,BUN:5,CREATININE:5,CALCIUM:5,MG:5 in the last 168 hours No results found for this basename: INR:5,PROTIME:5 in the last 168 hours Cardiac markers: No results found for this basename: CK:3,CKMB:3,TROPONINI:3,MYOGLOBIN:3 in the last 168 hours No components found with this basename: POCBNP:3 No results found for this or any previous visit (from the past 240 hour(s)).  Studies/Results: No results found.  Medications: Scheduled Meds:   . cloNIDine  0.1 mg Transdermal Weekly  . cloNIDine  0.1 mg Oral Once  . divalproex  250 mg Oral Q12H  . levETIRAcetam  500 mg Oral Q12H  . OLANZapine zydis  10 mg Oral QHS  . phenytoin  100 mg Oral TID   Continuous Infusions:  PRN Meds:.haloperidol lactate, lip balm, LORazepam, menthol-cetylpyridinium, OLANZapine, ondansetron (ZOFRAN) IV, ondansetron  Assessment/Plan:   Principal Problem:  *HTN (hypertension)  - stable, will continue the same medication regimen  Active Problems:  Psychosis  - stable, pt awaiting bed  - continue same medication regimen for now  EDUCATION  - test results and diagnostic studies were discussed with patient  - patient verbalized the understanding  - questions were answered at the bedside and contact information was provided for additional questions or concerns  LOS: 33 days   MAGICK-MYERS, ISKRA 03/19/2011, 9:31 AM  [facility] Pager: [phone]

## 2011-03-20 NOTE — Progress Notes (Signed)
LM for Pt's brother.   Spoke with Chrissy at [employer].  Chrissy stated that she learned from Rachel Moulds on Friday that the state reviewer will not be in the office until today.  Thus, the documents won't be reviewed until today.    Chrissy to contact CSW when she learns of disposition.  [name], LCSWA Clinical Social Work [phone]

## 2011-03-20 NOTE — Progress Notes (Signed)
Patient ID: Wesley Sullivan, male   DOB: 09-25-1940, 71 y.o.   MRN: [id number]  Subjective: No events overnight. Patient denies chest pain, shortness of breath, abdominal pain. Had bowel movement and reports ambulating.  Objective:  Vital signs in last 24 hours:  Filed Vitals:   03/19/11 0600 03/19/11 1432 03/19/11 2200 03/20/11 0600  BP: 126/72 137/75 145/82 115/75  Pulse: 62 87 79 63  Temp: 98.4 F (36.9 C) 98.1 F (36.7 C) 98.2 F (36.8 C) 98.2 F (36.8 C)  TempSrc: Oral Oral Oral Oral  Resp: 18 18 18 18   Height:      Weight:      SpO2: 99% 97% 100% 99%    Intake/Output from previous day:   Intake/Output Summary (Last 24 hours) at 03/20/11 0827 Last data filed at 03/19/11 1342  Gross per 24 hour  Intake    240 ml  Output      0 ml  Net    240 ml    Physical Exam: General: Alert, awake, oriented x3, in no acute distress. HEENT: No bruits, no goiter. Moist mucous membranes, no scleral icterus, no conjunctival pallor. Heart: Regular rate and rhythm, S1/S2 +, no murmurs, rubs, gallops. Lungs: Clear to auscultation bilaterally. No wheezing, no rhonchi, no rales.  Abdomen: Soft, nontender, nondistended, positive bowel sounds. Extremities: No clubbing or cyanosis, no pitting edema,  positive pedal pulses. Neuro: Grossly nonfocal.  Lab Results:  Basic Metabolic Panel:    Component Value Date/Time   NA 138 02/22/2011 0519   K 4.2 02/22/2011 0519   CL 104 02/22/2011 0519   CO2 28 02/22/2011 0519   BUN 15 02/22/2011 0519   CREATININE 0.82 02/22/2011 0519   GLUCOSE 90 02/22/2011 0519   CALCIUM 9.2 02/22/2011 0519   CBC:    Component Value Date/Time   WBC 5.5 02/14/2011 1655   HGB 13.8 02/14/2011 1655   HCT 40.7 02/14/2011 1655   PLT 230 02/14/2011 1655   MCV 90.6 02/14/2011 1655   NEUTROABS 2.9 02/14/2011 1655   LYMPHSABS 2.0 02/14/2011 1655   MONOABS 0.7 02/14/2011 1655   EOSABS 0.0 02/14/2011 1655   BASOSABS 0.0 02/14/2011 1655     No results found for this  basename: WBC:5,HGB:5,HCT:5,PLT:5,MCV:5,MCH:5,MCHC:5,RDW:5,NEUTRABS:5,LYMPHSABS:5,MONOABS:5,EOSABS:5,BASOSABS:5,BANDABS:5,BANDSABD:5 in the last 168 hours No results found for this basename: NA:5,K:5,CL:5,CO2:5,GLUCOSE:5,BUN:5,CREATININE:5,CALCIUM:5,MG:5 in the last 168 hours No results found for this basename: INR:5,PROTIME:5 in the last 168 hours Cardiac markers: No results found for this basename: CK:3,CKMB:3,TROPONINI:3,MYOGLOBIN:3 in the last 168 hours No components found with this basename: POCBNP:3 No results found for this or any previous visit (from the past 240 hour(s)).  Studies/Results: No results found.  Medications: Scheduled Meds:   . cloNIDine  0.1 mg Transdermal Weekly  . cloNIDine  0.1 mg Oral Once  . divalproex  250 mg Oral Q12H  . levETIRAcetam  500 mg Oral Q12H  . OLANZapine zydis  10 mg Oral QHS  . phenytoin  100 mg Oral TID   Continuous Infusions:  PRN Meds:.haloperidol lactate, lip balm, LORazepam, menthol-cetylpyridinium, OLANZapine, ondansetron (ZOFRAN) IV, ondansetron  Assessment/Plan:  Principal Problem:  *HTN (hypertension)  - stable, will continue the same medication regimen   Active Problems:  Psychosis  - stable, pt awaiting bed  - continue same medication regimen for now   EDUCATION  - test results and diagnostic studies were discussed with patient  - patient verbalized the understanding  - questions were answered at the bedside and contact information was provided for additional questions or concerns  -  awaiting placement    LOS: 34 days   MAGICK-Vannia Pola 03/20/2011, 8:27 AM  [facility] Pager: [phone]

## 2011-03-21 NOTE — Progress Notes (Signed)
Patient ID: Wesley Sullivan, male   DOB: [birth year], 71 y.o.   MRN: [id number]  Subjective: No events overnight. Patient denies chest pain, shortness of breath, abdominal pain. Had bowel movement and reports ambulating.  Objective:  Vital signs in last 24 hours:  Filed Vitals:   03/19/11 1432 03/19/11 2200 03/20/11 0600 03/21/11 0640  BP: 137/75 145/82 115/75 104/66  Pulse: 87 79 63 81  Temp: 98.1 F (36.7 C) 98.2 F (36.8 C) 98.2 F (36.8 C) 98.3 F (36.8 C)  TempSrc: Oral Oral Oral Oral  Resp: 18 18 18 18   Height:      Weight:      SpO2: 97% 100% 99% 100%    Intake/Output from previous day:   Intake/Output Summary (Last 24 hours) at 03/21/11 0955 Last data filed at 03/21/11 4098  Gross per 24 hour  Intake    720 ml  Output      3 ml  Net    717 ml    Physical Exam: General: Alert, awake, in no acute distress. HEENT: No bruits, no goiter. Moist mucous membranes, no scleral icterus, no conjunctival pallor. Heart: Regular rate and rhythm, S1/S2 +, no murmurs, rubs, gallops. Lungs: Clear to auscultation bilaterally. No wheezing, no rhonchi, no rales.  Abdomen: Soft, nontender, nondistended, positive bowel sounds. Extremities: No clubbing or cyanosis, no pitting edema,  positive pedal pulses. Neuro: Grossly nonfocal.  Lab Results:  Basic Metabolic Panel:    Component Value Date/Time   NA 138 02/22/2011 0519   K 4.2 02/22/2011 0519   CL 104 02/22/2011 0519   CO2 28 02/22/2011 0519   BUN 15 02/22/2011 0519   CREATININE 0.82 02/22/2011 0519   GLUCOSE 90 02/22/2011 0519   CALCIUM 9.2 02/22/2011 0519   CBC:    Component Value Date/Time   WBC 5.5 02/14/2011 1655   HGB 13.8 02/14/2011 1655   HCT 40.7 02/14/2011 1655   PLT 230 02/14/2011 1655   MCV 90.6 02/14/2011 1655   NEUTROABS 2.9 02/14/2011 1655   LYMPHSABS 2.0 02/14/2011 1655   MONOABS 0.7 02/14/2011 1655   EOSABS 0.0 02/14/2011 1655   BASOSABS 0.0 02/14/2011 1655    Studies/Results: No results  found.  Medications: Scheduled Meds:   . cloNIDine  0.1 mg Transdermal Weekly  . cloNIDine  0.1 mg Oral Once  . divalproex  250 mg Oral Q12H  . levETIRAcetam  500 mg Oral Q12H  . OLANZapine zydis  10 mg Oral QHS  . phenytoin  100 mg Oral TID   Continuous Infusions:  PRN Meds:.haloperidol lactate, lip balm, LORazepam, menthol-cetylpyridinium, OLANZapine, ondansetron (ZOFRAN) IV, ondansetron  Assessment/Plan:  Principal Problem:  *HTN (hypertension) - well controlled during the hospital stay - vitals are being monitored per floor protocol  Active Problems:  Psychosis - appears to be stable to date - continue Divalporex and Olanzapine - awaiting placement to Group Home   EDUCATION - test results and diagnostic studies were discussed with patient  - questions were answered at the bedside and contact information was provided for additional questions or concerns - pt is ready for d/c once bed is available   LOS: 35 days   MAGICK-MYERS, ISKRA 03/21/2011, 9:55 AM  [facility] Pager: [phone]

## 2011-03-22 NOTE — Progress Notes (Addendum)
Subjective: No new issues. Asymptomatic. Declining vitals, and all meds, except Dilantin.  Objective: Vital signs in last 24 hours:   Weight change:  Last BM Date: 03/17/11  Intake/Output from previous day: 01/29 0701 - 01/30 0700 In: 960 [P.O.:960] Out: -      Physical Exam: General: Comfortable, alert, follows simple commands, not short of breath at rest.  HEENT:  No clinical pallor, no jaundice, no conjunctival injection or discharge. Hydration appears satisfactory. NECK:  Supple, JVP not seen, no carotid bruits, no palpable lymphadenopathy, no palpable goiter. CHEST:  Clinically clear to auscultation, no wheezes, no crackles. HEART:  Sounds 1 and 2 heard, normal, regular, no murmurs. ABDOMEN:  Full, soft, non-tender, no palpable organomegaly, no palpable masses, normal bowel sounds. GENITALIA:  Not examined. LOWER EXTREMITIES:  No pitting edema, palpable peripheral pulses. MUSCULOSKELETAL SYSTEM:  Generalized osteoarthritic changes, otherwise, normal. CENTRAL NERVOUS SYSTEM:  No focal neurologic deficit on gross examination.  Lab Results: No results found for this basename: WBC:2,HGB:2,HCT:2,PLT:2 in the last 72 hours No results found for this basename: NA:2,K:2,CL:2,CO2:2,GLUCOSE:2,BUN:2,CREATININE:2,CALCIUM:2 in the last 72 hours No results found for this or any previous visit (from the past 240 hour(s)).   Studies/Results: No results found.  Medications: Scheduled Meds:   . cloNIDine  0.1 mg Transdermal Weekly  . cloNIDine  0.1 mg Oral Once  . divalproex  250 mg Oral Q12H  . levETIRAcetam  500 mg Oral Q12H  . OLANZapine zydis  10 mg Oral QHS  . phenytoin  100 mg Oral TID   Continuous Infusions:  PRN Meds:.haloperidol lactate, lip balm, LORazepam, menthol-cetylpyridinium, OLANZapine, ondansetron (ZOFRAN) IV, ondansetron  Assessment/Plan:  Principal Problem:  *HTN (hypertension): Unable to determine status of patient's BP at this time, as patient not allowing  vitals to be taken. Have discussed with RN, and if we can get at least one reading, it may be possible to address further. Patient is currently on Clonidine patch. Active Problems:  1. Psychosis: Stable. Managing per psych, with Divalporex and Olanzapine, although not compliant.  2. Seizure disorder. Asymptomatic on Dilantin.  3. Cerebral palsy/Mental retardation/Dementia. Stable.  Comment: Awaiting appropriate placement.     LOS: 36 days   Wesley Sullivan,Wesley Sullivan 03/22/2011, 7:01 PM

## 2011-03-22 NOTE — Progress Notes (Signed)
Spoke with Chrissy who stated that she received an e-mail from [employer] that had a forwarded e-mail from the [employer] rep attached to it.  Per that e-mail, Pt's application was under review.  Called [facility] and left a message for [employer] [phone]).  CSW to continue to follow.  [name], LCSWA Clinical Social Work [phone]

## 2011-03-22 NOTE — Progress Notes (Signed)
Pt still refuses his keppra and depakote.

## 2011-03-22 NOTE — Progress Notes (Signed)
Patient discussed at the Long Length of Stay Wesley Sullivan 03/22/2011  

## 2011-03-23 MED ORDER — CLONIDINE HCL 0.1 MG/24HR TD PTWK: 1.0000 | MEDICATED_PATCH | TRANSDERMAL | Status: DC

## 2011-03-23 MED ORDER — DIVALPROEX SODIUM 250 MG PO DR TAB: 250.0000 mg | DELAYED_RELEASE_TABLET | Freq: Two times a day (BID) | ORAL | Status: DC

## 2011-03-23 MED ORDER — LORAZEPAM 1 MG PO TABS: 1.0000 mg | ORAL_TABLET | Freq: Three times a day (TID) | ORAL | Status: AC | PRN
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
## 2011-03-23 NOTE — Progress Notes (Signed)
Spoke with Rachel Moulds who stated that he received confirmation on Tuesday that the psychologist at the [facility] received the information.  He stated that it's been his experience that if they had any pxs or questions about the submitted documents that that he would have been contacted by now.  [name] stated that he anticipates hearing back from them today or tomorrow and said that he would contact me as soon as he hears anything.  [name], LCSWA Clinical Social Work [phone]

## 2011-03-23 NOTE — Progress Notes (Signed)
D/C instructions reviewed w/ pt's brother, his HCPOA. All questions answered, no further questions. Pt d/c in w/c in stable condition to brother's car by this Clinical research associate. Pt brother in possession of d/c instructions, scripts, and all personal belongings.

## 2011-03-23 NOTE — Progress Notes (Signed)
Subjective: No new issues. Asymptomatic.  Objective: Vital signs in last 24 hours: Temp:  [98.3 F (36.8 C)-98.7 F (37.1 C)] 98.3 F (36.8 C) (01/31 0553) Pulse Rate:  [66-68] 68  (01/31 0759) Resp:  [18] 18  (01/31 0759) BP: (113-126)/(67-81) 114/81 mmHg (01/31 0759) SpO2:  [97 %-100 %] 100 % (01/31 0759) Weight change:  Last BM Date: 03/17/11  Intake/Output from previous day: 01/30 0701 - 01/31 0700 In: 960 [P.O.:960] Out: -  Total I/O In: 240 [P.O.:240] Out: -    Physical Exam: General: Comfortable, alert, follows simple commands, not short of breath at rest.  HEENT:  No clinical pallor, no jaundice, no conjunctival injection or discharge. Hydration appears satisfactory. NECK:  Supple, JVP not seen, no carotid bruits, no palpable lymphadenopathy, no palpable goiter. CHEST:  Clinically clear to auscultation, no wheezes, no crackles. HEART:  Sounds 1 and 2 heard, normal, regular, no murmurs. ABDOMEN:  Full, soft, non-tender, no palpable organomegaly, no palpable masses, normal bowel sounds. GENITALIA:  Not examined. LOWER EXTREMITIES:  No pitting edema, palpable peripheral pulses. MUSCULOSKELETAL SYSTEM:  Generalized osteoarthritic changes, otherwise, normal. CENTRAL NERVOUS SYSTEM:  No focal neurologic deficit on gross examination.  Lab Results: No results found for this basename: WBC:2,HGB:2,HCT:2,PLT:2 in the last 72 hours No results found for this basename: NA:2,K:2,CL:2,CO2:2,GLUCOSE:2,BUN:2,CREATININE:2,CALCIUM:2 in the last 72 hours No results found for this or any previous visit (from the past 240 hour(s)).   Studies/Results: No results found.  Medications: Scheduled Meds:    . cloNIDine  0.1 mg Transdermal Weekly  . cloNIDine  0.1 mg Oral Once  . divalproex  250 mg Oral Q12H  . levETIRAcetam  500 mg Oral Q12H  . OLANZapine zydis  10 mg Oral QHS  . phenytoin  100 mg Oral TID   Continuous Infusions:  PRN Meds:.haloperidol lactate, lip balm, LORazepam,  menthol-cetylpyridinium, OLANZapine, ondansetron (ZOFRAN) IV, ondansetron  Assessment/Plan:  Principal Problem:  *HTN (hypertension): Controlled on Clonidine patch. Active Problems:  1. Psychosis: Stable. Managing per psych, with Divalporex and Olanzapine, although not always compliant.  2. Seizure disorder. Asymptomatic on Dilantin.  3. Cerebral palsy/Mental retardation/Dementia. Stable.  Comment: Awaiting appropriate placement. Current care continues.    LOS: 37 days   Myeasha Ballowe,CHRISTOPHER 03/23/2011, 9:44 AM

## 2011-03-23 NOTE — Progress Notes (Signed)
Pt cooperative with assessement and VS this AM but refused all AM meds except his Dilantin. Ate all of breakfast and ambulates around room w/ steady gait. Calm and cooperative this AM.

## 2011-03-23 NOTE — Progress Notes (Signed)
Message from Rachel Moulds stating that Pt has been approved!  Confirmed with Chrissy at [facility] that Pt can be admitted there today.  Notified brother.  Brother to pick Pt up today between 1530-1600.  Notified Care Coordinator and RN.  Pt to be d/c'd.  [name], LCSWA Clinical Social Work [phone]

## 2012-04-17 ENCOUNTER — Ambulatory Visit
Admission: RE | Admit: 2012-04-17 | Discharge: 2012-04-17 | Disposition: A | Discharge status: Home / Self Care | Payer: Medicare Other | Source: Ambulatory Visit | Attending: Family Medicine | Admitting: Family Medicine

## 2012-04-17 ENCOUNTER — Other Ambulatory Visit: Payer: Self-pay | Admitting: Family Medicine

## 2012-04-17 DIAGNOSIS — M7989 Other specified soft tissue disorders: Secondary | ICD-10-CM

## 2012-06-10 ENCOUNTER — Telehealth: Payer: Self-pay | Admitting: *Deleted

## 2012-06-10 NOTE — Telephone Encounter (Signed)
Message copied by Monico Blitz on Mon Jun 10, 2012 12:02 PM ------      Message from: [facility], MONICA L      Created: Mon Jun 10, 2012  9:57 AM      Contact: Benetta Spar Watson/caretaker       Pt needs to be seen sooner than his 8/4 appt.  She feels like he is over medicated.  Please call and advise [phone]. ------

## 2012-06-11 ENCOUNTER — Telehealth: Payer: Self-pay | Admitting: *Deleted

## 2012-06-11 ENCOUNTER — Encounter: Payer: Self-pay | Admitting: *Deleted

## 2012-06-11 NOTE — Telephone Encounter (Signed)
Message copied by Harlon Flor Cru Kritikos L on Tue Jun 11, 2012  9:26 AM ------      Message from: [facility], MONICA L      Created: Mon Jun 10, 2012  9:57 AM      Contact: Benetta Spar Watson/caretaker       Pt needs to be seen sooner than his 8/4 appt.  She feels like he is over medicated.  Please call and advise [phone]. ------

## 2012-06-11 NOTE — Telephone Encounter (Signed)
Patient has been having changes in his behaver having a hard time swallowing his food taking his medication and holding his utensils when he eating. Patient is having frequent accidents with incontinence. [facility]  believes his dilantin levels is the Cause of his problems. DR.Frances Furbish has giving [place] her recommendation  which was faxed to the group home and patient has an appointment with Dr. Terrace Arabia per rotation.

## 2012-06-11 NOTE — Telephone Encounter (Signed)
Called caregiver to let her know i have not receive any text from his doctor with the patient blood work stating his levels are high yet. she will fax it to me and i will advise the doctor once i have them.

## 2012-06-11 NOTE — Telephone Encounter (Signed)
This encounter was created in error - please disregard.

## 2012-06-11 NOTE — Telephone Encounter (Deleted)
Pt needs to be seen sooner than his 8/4 appt. She feels like he is over medicated. Please call and advise [phone].

## 2012-06-11 NOTE — Telephone Encounter (Signed)
Called patient's caretaker(Krissy) at [phone] to get more details ,no answer, lt voicemail message for return call.

## 2012-06-11 NOTE — Telephone Encounter (Signed)
Message copied by Monico Blitz on Tue Jun 11, 2012  9:34 AM ------      Message from: [facility], MONICA L      Created: Mon Jun 10, 2012  9:57 AM      Contact: Benetta Spar Watson/caretaker       Pt needs to be seen sooner than his 8/4 appt.  She feels like he is over medicated.  Please call and advise [phone]. ------

## 2012-06-12 ENCOUNTER — Telehealth: Payer: Self-pay | Admitting: *Deleted

## 2012-06-17 ENCOUNTER — Other Ambulatory Visit: Payer: Self-pay | Admitting: Family Medicine

## 2012-06-17 DIAGNOSIS — R4182 Altered mental status, unspecified: Secondary | ICD-10-CM

## 2012-06-25 ENCOUNTER — Telehealth: Payer: Self-pay | Admitting: Neurology

## 2012-06-26 ENCOUNTER — Other Ambulatory Visit: Payer: Medicare Other

## 2012-06-26 ENCOUNTER — Ambulatory Visit
Admission: RE | Admit: 2012-06-26 | Discharge: 2012-06-26 | Disposition: A | Discharge status: Home / Self Care | Payer: Medicare Other | Source: Ambulatory Visit | Attending: Family Medicine | Admitting: Family Medicine

## 2012-06-26 DIAGNOSIS — R4182 Altered mental status, unspecified: Secondary | ICD-10-CM

## 2012-07-04 ENCOUNTER — Ambulatory Visit (INDEPENDENT_AMBULATORY_CARE_PROVIDER_SITE_OTHER): Payer: Medicare Other | Admitting: Neurology

## 2012-07-04 ENCOUNTER — Encounter: Payer: Self-pay | Admitting: Neurology

## 2012-07-04 VITALS — BP 136/88 | HR 75 | Ht 65.0 in | Wt 126.5 lb

## 2012-07-04 DIAGNOSIS — F79 Unspecified intellectual disabilities: Secondary | ICD-10-CM

## 2012-07-04 DIAGNOSIS — E119 Type 2 diabetes mellitus without complications: Secondary | ICD-10-CM

## 2012-07-04 DIAGNOSIS — F29 Unspecified psychosis not due to a substance or known physiological condition: Secondary | ICD-10-CM

## 2012-07-04 DIAGNOSIS — R569 Unspecified convulsions: Secondary | ICD-10-CM

## 2012-07-04 NOTE — Progress Notes (Signed)
HPI:  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 Previously Dr Imagene Gurney patient  He was first evaluated by Dr. Lucile Shutters 08/1965 at  age 80. At that time he had staring spells. Skull x-rays and EEG were performed  and a CT scan of the brain 04/18/1982 was performed. This was normal. Phenobarbital was started. He had multiple ER visits and a history of seizures characterized in the past by his sister as losing postural tone, falling onto the ground, turning stiff, and twisting his head to the left. The episodes would last a few minutes and then he would awaken.   There is a past history of alcohol abuse. He drank "as much as he could get his hands on". He had DTs until approximately 1995, when he was admitted from the mental health clinic to [facility]. He has also been in [facility].  He was cared for by his brother until the fall 2012 who stated that his seizures were well controlled on medication. His behavior became bad and He was admitted to [facility] nursing home. He refused to take his medicines, became combative, and was admitted to [facility] long hospital after being seen at, behavioral health 12/12. He was at [facility] for almost 2 months because of behaioral problems and not taking his medication.  He has seizures approximate one per month as a resident of [facility]. 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 He was last seen in our office 06/15/11. He has a history of alcoholic dementia and moderate mental retardation.  He was on carbamazepine and Dilantin however his carbamazepine dose was decreased to 200 b.i.d. from t.i.d. and Keppra was added to his regimen. He is currently on valproic sodium 500mg  qid daily,  phenytoin 100 mg 3 times a day and twice a day on Sundays, clonidine  0.1 mg patches change once per week, Seroquel 400 mg at bedtime, trazodone100 mg at bedtime, vitamin D 1000 international units daily, and melatonin 1 mg at night for sleep.   Blood studies 04/04/11 were normal CBC, basic metabolic panel, hemoglobin A1c 5.4,  Phenytoin level of 13.4 and Keppra level 8.9 prior to taper and discontinue. Blood studies 07/10/2011 are phenytoin 18.5, valproic acid level 42.2, normal CBC and CMP.  Based on the reports from the people at [facility]  group home, his behavior had been doing well.    UPDATE May 15th 2014: He continues to do very well, last seizure was February 2014, most recent laboratory evaluation showed Depakote level 96.5, Dilantin 20.9, he was noticed to have sleepiness, unsteady gait, dizziness.  His Depakote dosage was decreased to 500mg  4 times a day, Dilantin 100 mg 3 times a day with exception of Wednesday and Sunday twice a day.  Vitals   Physical Exam  General:  well-developed African American male.  Scoliosis. Intermittent mouth movements indicative of athetosis. Scoliosis. Right shoulder held higher than the left.   Neurologic Exam  Mental Status:  Alert. Very difficult to understand. Oriented to name only Will follow command occasionally.   Cranial Nerves:  Visual fields full to  scare and finger motion. Extraocular movements full.  Facial sensation was equal. Mild left facial weakness.Left ptosis. Severe dysarthria. Motor:  Good tone in the upper and lower extremities. Choreo-athetotic movements involving the distal and proximal musculature in the limbs  face. Sensory:  can fill pinprick.  Coordination:  Cannot  evaluate. Gait and Station:   Slightly ataxic gait, scoliosis, elevated left hip Reflexes:  2+ reflexes except absent ankle reflexes. Plantar response is downgoing.  Assessement and Plan: 72 yo old with  mental retardation, Athetosis with cerebral palsy, Bipolar affective disorder,  secondary generalized major motor seizures occurring  once per month  1. Keep current dose of Depakote ER 500 mg 4 times a day, Dilantin 100 mg 3 times a day, with exception of Wednesday and Sunday 100 mg twice a day 2, Depakote, Dilantin level today, I will call him report, 3, return to clinic with Wesley Sullivan 6-9 months

## 2012-07-05 ENCOUNTER — Other Ambulatory Visit: Payer: Self-pay | Admitting: Neurology

## 2012-07-09 ENCOUNTER — Telehealth: Payer: Self-pay | Admitting: Neurology

## 2012-07-09 LAB — PHENYTOIN LEVEL, FREE: Phenytoin: 21.8 ug/mL (ref 10.0–20.0)

## 2012-07-09 LAB — VALPROIC ACID LEVEL: Valproic Acid Lvl: 97 ug/mL (ref 50–100)

## 2012-07-09 NOTE — Telephone Encounter (Signed)
Wesley Sullivan,   I fail to reach patient, could not leave a message either,  Please ask her to take Dilantin 250mg  (100mg , 2 and 1/2 tabs) qday, Keep current depakote

## 2012-07-18 ENCOUNTER — Telehealth: Payer: Self-pay | Admitting: Neurology

## 2012-07-18 MED ORDER — PHENYTOIN 50 MG PO CHEW: 50.0000 mg | CHEWABLE_TABLET | Freq: Every day | ORAL | Status: DC

## 2012-07-18 MED ORDER — PHENYTOIN SODIUM EXTENDED 100 MG PO CAPS: 200.0000 mg | ORAL_CAPSULE | Freq: Every day | ORAL | Status: DC

## 2012-07-18 NOTE — Telephone Encounter (Signed)
I spoke to [employer] at [facility] and relayed the message to have patient take Dilantin 250mg  (2 and 1/2 tabs) qday.  She said the Dilantin is a capsule and she is unable to break in half.  Please advise.    She also asked that I fax labs and new med order to her at [phone].

## 2012-07-18 NOTE — Telephone Encounter (Signed)
Lupita Leash, please call his facility, I will order Dilantin 50mg  tabs, along with 100mg  tabs. He should take 100mg  2 tabs plus one 50mg  tab to make it 250mg  dilantin each day,   Keep current dose of depakote.

## 2012-07-18 NOTE — Telephone Encounter (Signed)
Patient's pharmacy is [facility] in [place].  The number is [phone] and the fax is [phone].

## 2012-07-20 NOTE — Telephone Encounter (Signed)
I have called his group home and relayed a new prescription for Dilantin will be called in.  I will also fax over the note with the changed dosage of Dilantin.

## 2012-07-25 ENCOUNTER — Ambulatory Visit: Payer: Medicare Other | Attending: Family Medicine | Admitting: Physical Therapy

## 2012-07-25 DIAGNOSIS — Z5189 Encounter for other specified aftercare: Secondary | ICD-10-CM | POA: Insufficient documentation

## 2012-07-25 DIAGNOSIS — R4189 Other symptoms and signs involving cognitive functions and awareness: Secondary | ICD-10-CM | POA: Insufficient documentation

## 2012-07-25 DIAGNOSIS — R279 Unspecified lack of coordination: Secondary | ICD-10-CM | POA: Insufficient documentation

## 2012-07-25 DIAGNOSIS — R269 Unspecified abnormalities of gait and mobility: Secondary | ICD-10-CM | POA: Insufficient documentation

## 2012-07-29 NOTE — Telephone Encounter (Signed)
I have called in Dilantin 50mg  chewablet tabs

## 2012-07-30 ENCOUNTER — Ambulatory Visit: Payer: Medicare Other | Admitting: Occupational Therapy

## 2012-08-07 ENCOUNTER — Ambulatory Visit: Payer: Medicare Other | Admitting: Occupational Therapy

## 2012-08-16 ENCOUNTER — Emergency Department (HOSPITAL_COMMUNITY)
Admission: EM | Admit: 2012-08-16 | Discharge: 2012-08-16 | Disposition: A | Discharge status: Home / Self Care | Payer: Medicare Other | Attending: Emergency Medicine | Admitting: Emergency Medicine

## 2012-08-16 ENCOUNTER — Emergency Department (HOSPITAL_COMMUNITY): Payer: Medicare Other

## 2012-08-16 ENCOUNTER — Encounter (HOSPITAL_COMMUNITY): Payer: Self-pay | Admitting: Emergency Medicine

## 2012-08-16 DIAGNOSIS — Z87448 Personal history of other diseases of urinary system: Secondary | ICD-10-CM | POA: Insufficient documentation

## 2012-08-16 DIAGNOSIS — R892 Abnormal level of other drugs, medicaments and biological substances in specimens from other organs, systems and tissues: Secondary | ICD-10-CM | POA: Insufficient documentation

## 2012-08-16 DIAGNOSIS — E162 Hypoglycemia, unspecified: Secondary | ICD-10-CM

## 2012-08-16 DIAGNOSIS — Z8719 Personal history of other diseases of the digestive system: Secondary | ICD-10-CM | POA: Insufficient documentation

## 2012-08-16 DIAGNOSIS — F0390 Unspecified dementia without behavioral disturbance: Secondary | ICD-10-CM

## 2012-08-16 DIAGNOSIS — E1169 Type 2 diabetes mellitus with other specified complication: Secondary | ICD-10-CM | POA: Insufficient documentation

## 2012-08-16 DIAGNOSIS — G40909 Epilepsy, unspecified, not intractable, without status epilepticus: Secondary | ICD-10-CM | POA: Insufficient documentation

## 2012-08-16 DIAGNOSIS — Z862 Personal history of diseases of the blood and blood-forming organs and certain disorders involving the immune mechanism: Secondary | ICD-10-CM | POA: Insufficient documentation

## 2012-08-16 DIAGNOSIS — Z8669 Personal history of other diseases of the nervous system and sense organs: Secondary | ICD-10-CM | POA: Insufficient documentation

## 2012-08-16 DIAGNOSIS — F79 Unspecified intellectual disabilities: Secondary | ICD-10-CM | POA: Insufficient documentation

## 2012-08-16 DIAGNOSIS — R7889 Finding of other specified substances, not normally found in blood: Secondary | ICD-10-CM

## 2012-08-16 DIAGNOSIS — Z9181 History of falling: Secondary | ICD-10-CM | POA: Insufficient documentation

## 2012-08-16 DIAGNOSIS — I10 Essential (primary) hypertension: Secondary | ICD-10-CM | POA: Insufficient documentation

## 2012-08-16 DIAGNOSIS — Z79899 Other long term (current) drug therapy: Secondary | ICD-10-CM | POA: Insufficient documentation

## 2012-08-16 LAB — URINALYSIS, ROUTINE W REFLEX MICROSCOPIC
Glucose, UA: NEGATIVE mg/dL
Ketones, ur: NEGATIVE mg/dL
Leukocytes, UA: NEGATIVE
Nitrite: NEGATIVE
Protein, ur: NEGATIVE mg/dL

## 2012-08-16 LAB — BASIC METABOLIC PANEL
GFR calc Af Amer: >90 mL/min (ref >90)
GFR calc non Af Amer: 85 mL/min — ABNORMAL LOW (ref >90)
Potassium: 4.4 mEq/L (ref 3.5–5.1)
Sodium: 137 mEq/L (ref 135–145)

## 2012-08-16 LAB — CBC WITH DIFFERENTIAL/PLATELET
Basophils Absolute: 0 10*3/uL (ref 0.0–0.1)
Basophils Relative: 0 % (ref 0–1)
Eosinophils Absolute: 0 10*3/uL (ref 0.0–0.7)
MCH: 32.3 pg (ref 26.0–34.0)
MCHC: 33.3 g/dL (ref 30.0–36.0)
Neutrophils Relative %: 67 % (ref 43–77)
Platelets: 125 10*3/uL — ABNORMAL LOW (ref 150–400)

## 2012-08-16 LAB — GLUCOSE, CAPILLARY: Glucose-Capillary: 93 mg/dL (ref 70–99)

## 2012-08-16 LAB — PHENYTOIN LEVEL, TOTAL: Phenytoin Lvl: 25.2 ug/mL — ABNORMAL HIGH (ref 10.0–20.0)

## 2012-08-16 LAB — VALPROIC ACID LEVEL: Valproic Acid Lvl: 88.8 ug/mL (ref 50.0–100.0)

## 2012-08-16 NOTE — ED Provider Notes (Addendum)
History    CSN: [id number] Arrival date & time 08/16/12  1129  First MD Initiated Contact with Patient 08/16/12 1229     Chief Complaint  Patient presents with  . Hypoglycemia   (Consider location/radiation/quality/duration/timing/severity/associated sxs/prior Treatment) The history is provided by the patient and a caregiver. The history is limited by the condition of the patient.  NICOLES SEDLACEK is a 72 y.o. male hx of DM not on meds, dementia, mental retardation with frequent falls, here with hypoglycemia. Went to PCP office today and CBG was 40s and sent for eval. As per care giver, he falls often. He also gets disoriented frequently but his mental status is at baseline today. Denies fevers or chills or cough or vomiting or abdominal pain. He is on dilantin and depakote for seizures. No recent changes in meds.   Level V caveat- dementia, mental retardation   Past Medical History  Diagnosis Date  . Cerebral palsy   . Mental retardation   . Diabetes mellitus   . Dementia   . Seizures   . Gastroesophageal reflux disease   . Acute renal failure   . Constipation   . Speech impairment   . Hypertension   . Anemia    History reviewed. No pertinent past surgical history. No family history on file. History  Substance Use Topics  . Smoking status: Never Smoker   . Smokeless tobacco: Never Used  . Alcohol Use: No    Review of Systems  Musculoskeletal:       Bruise L face   All other systems reviewed and are negative.    Allergies  Review of patient's allergies indicates no known allergies.  Home Medications   Current Outpatient Rx  Name  Route  Sig  Dispense  Refill  . cholecalciferol (VITAMIN D) 1000 UNITS tablet   Oral   Take 1,000 Units by mouth daily.         . divalproex (DEPAKOTE ER) 500 MG 24 hr tablet   Oral   Take 500 mg by mouth 4 (four) times daily.         Marland Kitchen ENSURE PLUS (ENSURE PLUS) LIQD   Oral   Take 237 mLs by mouth daily.          .  Melatonin 1 MG CAPS   Oral   Take 1 capsule by mouth daily. Takes at 7pm daily          . phenytoin (DILANTIN INFATABS) 50 MG tablet   Oral   Chew 1 tablet (50 mg total) by mouth daily.   30 tablet   12   . phenytoin (DILANTIN) 100 MG ER capsule   Oral   Take 2 capsules (200 mg total) by mouth daily.   60 capsule   12   . QUEtiapine (SEROQUEL) 400 MG tablet   Oral   Take 400 mg by mouth 2 (two) times daily.          . traZODone (DESYREL) 100 MG tablet   Oral   Take 100 mg by mouth 2 (two) times daily. At bed time         . EXPIRED: cloNIDine (CATAPRES - DOSED IN MG/24 HR) 0.1 mg/24hr patch   Transdermal   Place 1 patch (0.1 mg total) onto the skin once a week.   4 patch   3    BP 142/90  Pulse 58  Temp(Src) 97.5 F (36.4 C) (Oral)  Resp 12  SpO2 99% Physical Exam  Nursing  note and vitals reviewed. Constitutional:  Demented, alert   HENT:  Head: Normocephalic.  Hematoma above L eye, extraocular movements intact   Eyes: Conjunctivae are normal. Pupils are equal, round, and reactive to light.  Neck: Normal range of motion. Neck supple.  Cardiovascular: Normal rate, regular rhythm and normal heart sounds.   Pulmonary/Chest: Effort normal and breath sounds normal. No respiratory distress. He has no wheezes. He has no rales.  Abdominal: Soft. Bowel sounds are normal. He exhibits no distension. There is no tenderness.  Musculoskeletal: Normal range of motion. He exhibits no edema and no tenderness.  Neurological: He is alert.  Demented, moving all extremities   Skin: Skin is warm and dry.  Psychiatric:  Unable     ED Course  Procedures (including critical care time) Labs Reviewed  CBC WITH DIFFERENTIAL - Abnormal; Notable for the following:    RBC 3.99 (*)    Hemoglobin 12.9 (*)    HCT 38.7 (*)    Platelets 125 (*)    Monocytes Relative 13 (*)    All other components within normal limits  BASIC METABOLIC PANEL - Abnormal; Notable for the following:     GFR calc non Af Amer 85 (*)    All other components within normal limits  PHENYTOIN LEVEL, TOTAL - Abnormal; Notable for the following:    Phenytoin Lvl 25.2 (*)    All other components within normal limits  GLUCOSE, CAPILLARY - Abnormal; Notable for the following:    Glucose-Capillary 69 (*)    All other components within normal limits  GLUCOSE, CAPILLARY - Abnormal; Notable for the following:    Glucose-Capillary 108 (*)    All other components within normal limits  GLUCOSE, CAPILLARY  VALPROIC ACID LEVEL  URINALYSIS, ROUTINE W REFLEX MICROSCOPIC   Dg Chest 2 View  08/16/2012   *RADIOLOGY REPORT*  Clinical Data: Fall, altered mental status  CHEST - 2 VIEW  Comparison: Portable chest x-ray of 03/10/2011  Findings: The lungs are not well aerated with mild basilar atelectasis.  No pneumothorax is seen.  Mild cardiomegaly is stable.  Mediastinal contours are stable.  There are degenerative changes noted in the shoulders.  IMPRESSION: Poor inspiration with mild basilar atelectasis.  No active lung disease.   Original Report Authenticated By: Dwyane Dee, M.D.   Dg Pelvis 1-2 Views  08/16/2012   *RADIOLOGY REPORT*  Clinical Data: Recent fall, altered mental status  PELVIS - 1-2 VIEW  Comparison:   CT abdomen pelvis of 09/26/2008  Findings: A single view of the pelvis shows no acute fracture.  The hip joint spaces show only mild degenerative joint disease.  The pelvic rami appear intact.  The SI joints are symmetrical and normal.  IMPRESSION: No acute fracture.   Original Report Authenticated By: Dwyane Dee, M.D.   Ct Head Wo Contrast  08/16/2012   *RADIOLOGY REPORT*  Clinical Data:  Fall.  Altered mental status.  Involuntary movements.  Laceration above the left eye.  CT HEAD WITHOUT CONTRAST CT MAXILLOFACIAL WITHOUT CONTRAST CT CERVICAL SPINE WITHOUT CONTRAST  Technique:  Multidetector CT imaging of the head, cervical spine, and maxillofacial structures were performed using the standard protocol  without intravenous contrast. Multiplanar CT image reconstructions of the cervical spine and maxillofacial structures were also generated.  Comparison:  Multiple exams, including 06/26/2012 and 10/06/1008  CT HEAD  Findings: There is mild cerebral atrophy for age. Periventricular and corona radiata white matter hypodensities are most compatible with chronic ischemic microvascular white matter disease.  Stable right frontal lobe encephalomalacia from remote stroke noted.  No intracranial hemorrhage, mass lesion, or acute CVA observed. There is mild scalp hematoma along the forehead bilaterally as well as along the right posterior parietal scalp.  Atherosclerotic calcification of the carotid siphons noted.  IMPRESSION:  1.  No acute intracranial findings. 2.  Old right frontal lobe encephalomalacia and chronic microvascular white matter disease with mild atrophy. 3.  Scalp hematoma along the forehead and right posterior parietal region.  CT MAXILLOFACIAL  Findings:  Old left orbital floor fracture observed with herniated adipose tissue but no extraocular muscle herniation identified.  Polypoid mucoperiosteal thickening of the right maxillary sinus noted.  Left nasal fracture, likely old.  No other facial fractures identified.  Despite efforts by the patient and technologist, motion artifact is present on some series of today's examination and could not be totally eliminated.  This reduces diagnostic sensitivity and specificity.  IMPRESSION:  1.  Old left orbital floor fracture with herniated orbital adipose tissue, but no extraocular muscle herniation. 2.  Old left nasal bone fracture. 3.  No acute facial fracture observed. 4.  Mucous retention cyst in the right maxillary sinus.  CT CERVICAL SPINE  Findings:   Prominent cervical spondylosis and degenerative disc disease noted with interbody fusion at C4-5 and osseous foraminal impingement at all levels between C2 and T2 bilaterally.  Suspected central stenosis at C2-3  and C3-4 due to congenitally short pedicles, spurring, and degenerative disc disease.  Mild kyphotic angulation at C5-6 with about 2 mm of degenerative appearing anterolisthesis.  No fracture or acute subluxation is identified.  IMPRESSION: 1.  Marked cervical spondylosis and degenerative disc disease causing multilevel impingement.  No fracture or acute subluxation is observed.   Original Report Authenticated By: Gaylyn Rong, M.D.   Ct Cervical Spine Wo Contrast  08/16/2012   *RADIOLOGY REPORT*  Clinical Data:  Fall.  Altered mental status.  Involuntary movements.  Laceration above the left eye.  CT HEAD WITHOUT CONTRAST CT MAXILLOFACIAL WITHOUT CONTRAST CT CERVICAL SPINE WITHOUT CONTRAST  Technique:  Multidetector CT imaging of the head, cervical spine, and maxillofacial structures were performed using the standard protocol without intravenous contrast. Multiplanar CT image reconstructions of the cervical spine and maxillofacial structures were also generated.  Comparison:  Multiple exams, including 06/26/2012 and 10/06/1008  CT HEAD  Findings: There is mild cerebral atrophy for age. Periventricular and corona radiata white matter hypodensities are most compatible with chronic ischemic microvascular white matter disease.  Stable right frontal lobe encephalomalacia from remote stroke noted.  No intracranial hemorrhage, mass lesion, or acute CVA observed. There is mild scalp hematoma along the forehead bilaterally as well as along the right posterior parietal scalp.  Atherosclerotic calcification of the carotid siphons noted.  IMPRESSION:  1.  No acute intracranial findings. 2.  Old right frontal lobe encephalomalacia and chronic microvascular white matter disease with mild atrophy. 3.  Scalp hematoma along the forehead and right posterior parietal region.  CT MAXILLOFACIAL  Findings:  Old left orbital floor fracture observed with herniated adipose tissue but no extraocular muscle herniation identified.   Polypoid mucoperiosteal thickening of the right maxillary sinus noted.  Left nasal fracture, likely old.  No other facial fractures identified.  Despite efforts by the patient and technologist, motion artifact is present on some series of today's examination and could not be totally eliminated.  This reduces diagnostic sensitivity and specificity.  IMPRESSION:  1.  Old left orbital floor fracture with herniated orbital adipose tissue,  but no extraocular muscle herniation. 2.  Old left nasal bone fracture. 3.  No acute facial fracture observed. 4.  Mucous retention cyst in the right maxillary sinus.  CT CERVICAL SPINE  Findings:   Prominent cervical spondylosis and degenerative disc disease noted with interbody fusion at C4-5 and osseous foraminal impingement at all levels between C2 and T2 bilaterally.  Suspected central stenosis at C2-3 and C3-4 due to congenitally short pedicles, spurring, and degenerative disc disease.  Mild kyphotic angulation at C5-6 with about 2 mm of degenerative appearing anterolisthesis.  No fracture or acute subluxation is identified.  IMPRESSION: 1.  Marked cervical spondylosis and degenerative disc disease causing multilevel impingement.  No fracture or acute subluxation is observed.   Original Report Authenticated By: Gaylyn Rong, M.D.   Ct Maxillofacial Wo Cm  08/16/2012   *RADIOLOGY REPORT*  Clinical Data:  Fall.  Altered mental status.  Involuntary movements.  Laceration above the left eye.  CT HEAD WITHOUT CONTRAST CT MAXILLOFACIAL WITHOUT CONTRAST CT CERVICAL SPINE WITHOUT CONTRAST  Technique:  Multidetector CT imaging of the head, cervical spine, and maxillofacial structures were performed using the standard protocol without intravenous contrast. Multiplanar CT image reconstructions of the cervical spine and maxillofacial structures were also generated.  Comparison:  Multiple exams, including 06/26/2012 and 10/06/1008  CT HEAD  Findings: There is mild cerebral atrophy for  age. Periventricular and corona radiata white matter hypodensities are most compatible with chronic ischemic microvascular white matter disease.  Stable right frontal lobe encephalomalacia from remote stroke noted.  No intracranial hemorrhage, mass lesion, or acute CVA observed. There is mild scalp hematoma along the forehead bilaterally as well as along the right posterior parietal scalp.  Atherosclerotic calcification of the carotid siphons noted.  IMPRESSION:  1.  No acute intracranial findings. 2.  Old right frontal lobe encephalomalacia and chronic microvascular white matter disease with mild atrophy. 3.  Scalp hematoma along the forehead and right posterior parietal region.  CT MAXILLOFACIAL  Findings:  Old left orbital floor fracture observed with herniated adipose tissue but no extraocular muscle herniation identified.  Polypoid mucoperiosteal thickening of the right maxillary sinus noted.  Left nasal fracture, likely old.  No other facial fractures identified.  Despite efforts by the patient and technologist, motion artifact is present on some series of today's examination and could not be totally eliminated.  This reduces diagnostic sensitivity and specificity.  IMPRESSION:  1.  Old left orbital floor fracture with herniated orbital adipose tissue, but no extraocular muscle herniation. 2.  Old left nasal bone fracture. 3.  No acute facial fracture observed. 4.  Mucous retention cyst in the right maxillary sinus.  CT CERVICAL SPINE  Findings:   Prominent cervical spondylosis and degenerative disc disease noted with interbody fusion at C4-5 and osseous foraminal impingement at all levels between C2 and T2 bilaterally.  Suspected central stenosis at C2-3 and C3-4 due to congenitally short pedicles, spurring, and degenerative disc disease.  Mild kyphotic angulation at C5-6 with about 2 mm of degenerative appearing anterolisthesis.  No fracture or acute subluxation is identified.  IMPRESSION: 1.  Marked  cervical spondylosis and degenerative disc disease causing multilevel impingement.  No fracture or acute subluxation is observed.   Original Report Authenticated By: Gaylyn Rong, M.D.   No diagnosis found.  MDM  GARRETT MITCHUM is a 72 y.o. male here with hypoglycemia. Given that he is not on meds for DM, makes me concerned for early sepsis. Will do sepsis workup. He is  not hypotensive in the ED or showing other signs of severe sepsis. Will also check depakote and dilantin level to make sure its not supratherapeutic.     3:28 PM CT showed old fractures. UA nl, WBC nl, xray nl. Dilantin level slightly elevated. Will have him skip a dose and then continue back on same dose and f/u to get a level in 3-4 days. CBG on discharge is 108. Patient comfortable, no complaints.    Richardean Canal, MD 08/16/12 1513  Richardean Canal, MD 08/16/12 1530  Richardean Canal, MD 08/16/12 [phone]

## 2012-08-16 NOTE — ED Notes (Signed)
Per pt's care giver, was at PCP's office for check up-CBG was in 40's-given glucose tab-care giver states he seemed a bit disoriented and his gait has been off-recent fall-hematoma above left eye

## 2012-08-16 NOTE — ED Notes (Signed)
Pt given OJ, crackers, milk and peanut butter

## 2012-08-20 ENCOUNTER — Ambulatory Visit (INDEPENDENT_AMBULATORY_CARE_PROVIDER_SITE_OTHER): Payer: Medicare Other | Admitting: Nurse Practitioner

## 2012-08-20 ENCOUNTER — Encounter: Payer: Self-pay | Admitting: Nurse Practitioner

## 2012-08-20 VITALS — BP 124/88 | HR 82 | Ht 65.0 in | Wt 112.0 lb

## 2012-08-20 DIAGNOSIS — F79 Unspecified intellectual disabilities: Secondary | ICD-10-CM

## 2012-08-20 DIAGNOSIS — R569 Unspecified convulsions: Secondary | ICD-10-CM

## 2012-08-20 DIAGNOSIS — Z79899 Other long term (current) drug therapy: Secondary | ICD-10-CM

## 2012-08-20 NOTE — Patient Instructions (Addendum)
Dilantin level today F/U 6 months

## 2012-08-20 NOTE — Progress Notes (Signed)
HPI: 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 Previously Dr Imagene Gurney patient  He was first evaluated by Dr. Lucile Shutters 08/1965 at age 53. At that time he had staring spells. Skull x-rays and EEG were performed and a CT scan of the brain 04/18/1982 was performed. This was normal. Phenobarbital was started. He had multiple ER visits and a history of seizures characterized in the past by his sister as losing postural tone, falling onto the ground, turning stiff, and twisting his head to the left. The episodes would last a few minutes and then he would awaken.  There is a past history of alcohol abuse. He drank "as much as he could get his hands on". He had DTs until approximately 1995, when he was admitted from the mental health clinic to [facility]. He has also been in [facility]. He was cared for by his brother until the fall 2012 who stated that his seizures were well controlled on medication. His behavior became bad and He was admitted to [facility] nursing home. He refused to take his medicines, became combative, and was admitted to [facility] long hospital after being seen at, behavioral health 12/12. He was at [facility] for almost 2 months because of behaioral problems and not taking his medication.  He has seizures approximate one per month as a resident of [facility]. 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 He was last seen in our office 07/04/12 by Dr. Terrace Arabia.  He has a history of alcoholic dementia and moderate mental retardation. He was on carbamazepine and Dilantin however his carbamazepine dose was decreased to 200 b.i.d. from t.i.d. and Keppra was added to his regimen. He is currently on valproic sodium 500mg  qid daily, phenytoin 250mg   Daily  clonidine 0.1 mg patches change once per  week, Seroquel 400 mg at bedtime, trazodone100 mg at bedtime, vitamin D 1000 international units daily, and melatonin 1 mg at night for sleep.   Blood studies 07/10/2011 are phenytoin 18.5, valproic acid level 42.2, normal CBC and CMP.  Based on the reports from the people at [facility] group home, his behavior had been doing well.  ER visit 08/16/12 for hypoglycemia with toxic level of Dilantin. His medication was held for 2 days and he returns to get repeat Dilantin level. Last seizure was 07/25/12.     ROS: Weight loss confusion seizure  Physical Exam General: well developed, well nourished, seated, in no evident distress Head: head normocephalic and atraumatic. Oropharynx benign Neck: supple with no carotid  bruits Cardiovascular: regular rate and rhythm, no murmurs  Neurologic Exam Mental Status: Awake and  alert. Difficult to understand, does not follow commands. Severe dysarthria    Cranial Nerves:  Pupils equal, briskly reactive to light. Extraocular movements full without nystagmus. Visual fields full to confrontation. Hearing intact and symmetric to finger snap. Mild left facial weakness, left ptosis   Motor: Normal bulk and tone. Normal strength in all tested extremity muscles.Choreo-athetotic movements involving the distal and proximal musculature in the limbs and face. Sensory.: Withdrawal to pinprick   Coordination: Unable to evaluate  Gait and Station: Slightly ataxic gait, scoliosis, elevated left hip. No assistive device   Reflexes: 2+ and symmetric except absent ankle jerks. Toes downgoing.     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

## 2012-08-21 ENCOUNTER — Ambulatory Visit: Payer: Medicare Other | Attending: Family Medicine | Admitting: Occupational Therapy

## 2012-08-21 ENCOUNTER — Other Ambulatory Visit: Payer: Self-pay | Admitting: Nurse Practitioner

## 2012-08-21 DIAGNOSIS — Z79899 Other long term (current) drug therapy: Secondary | ICD-10-CM

## 2012-08-21 DIAGNOSIS — R269 Unspecified abnormalities of gait and mobility: Secondary | ICD-10-CM | POA: Insufficient documentation

## 2012-08-21 DIAGNOSIS — R279 Unspecified lack of coordination: Secondary | ICD-10-CM | POA: Insufficient documentation

## 2012-08-21 DIAGNOSIS — Z5189 Encounter for other specified aftercare: Secondary | ICD-10-CM | POA: Insufficient documentation

## 2012-08-21 DIAGNOSIS — R4189 Other symptoms and signs involving cognitive functions and awareness: Secondary | ICD-10-CM | POA: Insufficient documentation

## 2012-08-27 ENCOUNTER — Ambulatory Visit: Payer: Medicare Other | Admitting: Occupational Therapy

## 2012-08-30 ENCOUNTER — Other Ambulatory Visit: Payer: Self-pay | Admitting: Nurse Practitioner

## 2012-09-03 ENCOUNTER — Ambulatory Visit: Payer: Medicare Other | Admitting: Occupational Therapy

## 2012-09-04 ENCOUNTER — Telehealth: Payer: Self-pay | Admitting: Nurse Practitioner

## 2012-09-04 NOTE — Telephone Encounter (Signed)
Pt has not had f/u Dilantin level. This was ordered last office visit.

## 2012-09-09 NOTE — Telephone Encounter (Signed)
Called patient to inform of NP-Carolyn's previous note. No answer.

## 2012-09-10 NOTE — Telephone Encounter (Signed)
Spoke to caretaker. Says patient had labs drawn on 08/30/12 @ [facility]. [employer], had lab results faxed.

## 2012-09-19 NOTE — Telephone Encounter (Signed)
Lab was faxed.  Dilantin level 10.3, given to Dr. Terrace Arabia.

## 2012-09-20 NOTE — Telephone Encounter (Signed)
Dr. Terrace Arabia signed off on level, I spoke to caretaker and Dilantin level was in range, no change.

## 2012-09-23 ENCOUNTER — Ambulatory Visit: Payer: Self-pay | Admitting: Neurology

## 2012-10-01 LAB — PHENYTOIN LEVEL, TOTAL: Phenytoin Lvl: 10.3 ug/mL (ref 10.0–20.0)

## 2012-10-03 NOTE — Progress Notes (Signed)
Quick Note:  I called group home. Spoke to [place], and relayed the dilantin level in good range. Keep on Dilantin 200mg  po daily. Call for concerns, questions. ______

## 2012-10-25 ENCOUNTER — Telehealth: Payer: Self-pay | Admitting: Neurology

## 2012-10-25 MED ORDER — DIVALPROEX SODIUM ER 500 MG PO TB24: 500.0000 mg | ORAL_TABLET | Freq: Four times a day (QID) | ORAL | Status: DC

## 2012-10-25 NOTE — Telephone Encounter (Signed)
Rx Sent  

## 2013-02-03 ENCOUNTER — Ambulatory Visit (INDEPENDENT_AMBULATORY_CARE_PROVIDER_SITE_OTHER): Payer: Medicare Other | Admitting: Nurse Practitioner

## 2013-02-03 ENCOUNTER — Encounter (INDEPENDENT_AMBULATORY_CARE_PROVIDER_SITE_OTHER): Payer: Self-pay

## 2013-02-03 ENCOUNTER — Encounter: Payer: Self-pay | Admitting: Nurse Practitioner

## 2013-02-03 VITALS — BP 126/80 | HR 73 | Ht 65.0 in | Wt 126.0 lb

## 2013-02-03 DIAGNOSIS — R569 Unspecified convulsions: Secondary | ICD-10-CM

## 2013-02-03 DIAGNOSIS — Z79899 Other long term (current) drug therapy: Secondary | ICD-10-CM

## 2013-02-03 DIAGNOSIS — F79 Unspecified intellectual disabilities: Secondary | ICD-10-CM

## 2013-02-03 MED ORDER — DIVALPROEX SODIUM ER 500 MG PO TB24: 500.0000 mg | ORAL_TABLET | Freq: Four times a day (QID) | ORAL | Status: DC

## 2013-02-03 NOTE — Progress Notes (Signed)
[facility]  PATIENT: Wesley Sullivan DOB: Oct 31, 1940   REASON FOR VISIT: Seizure disorder    HISTORY OF PRESENT ILLNESS: Wesley Sullivan, 72 year old black male returns for followup. 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 Last seizure event occurred 07/25/2012. He is currently on Dilantin and Depakote and both levels of the drug had recently been checked by his primary care Wesley Sullivan.He resides at [facility]. There have been no behavior issues. He has not fallen. Appetite is reportedly good and he is sleeping well at night.   HISTORY: right handed single [place] American male with mental retardation since birth associated with an athetotic movement disorder, most likely on the basis of cerebral palsy. Previously Wesley Sullivan patient  He was first evaluated by Wesley Sullivan 08/1965 at age 72. At that time he had staring spells. Skull x-rays and EEG were performed and a CT scan of the brain 04/18/1982 was performed. This was normal. Phenobarbital was started. He had multiple ER visits and a history of seizures characterized in the past by his sister as losing postural tone, falling onto the ground, turning stiff, and twisting his head to the left. The episodes would last a few minutes and then he would awaken.  There is a past history of alcohol abuse. He drank "as much as he could get his hands on". He had DTs until approximately 1995, when he was admitted from the mental health clinic to [facility]. He has also been in [facility]. He was cared for by his brother until the fall 2012 who stated that his seizures were well controlled on medication. His behavior became bad and He was admitted to [facility] nursing home. He refused to take his medicines, became combative, and was admitted to [facility] long hospital after being seen at, behavioral health 12/12. He  was at [facility] for almost 2 months because of behaioral problems and not taking his medication.  He has seizures approximate one per month as a resident of [facility]. 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 He was last seen in our office 07/04/12 by Wesley Sullivan. He has a history of alcoholic dementia and moderate mental retardation. He was on carbamazepine and Dilantin however his carbamazepine dose was decreased to 200 b.i.d. from t.i.d. and Keppra was added to his regimen. He is currently on valproic sodium 500mg  qid daily, phenytoin 250mg  Daily clonidine 0.1 mg patches change once per week, Seroquel 400 mg at bedtime, trazodone100 mg at bedtime, vitamin D 1000 international units daily, and melatonin 1 mg at night for sleep.  Blood studies 07/10/2011 are phenytoin 18.5, valproic acid level 42.2, normal CBC and CMP.  Based on the reports from the people at [facility] group home, his behavior had been doing well.  ER visit 08/16/12 for hypoglycemia with toxic level of Dilantin. His medication was held for 2 days and he returns to get repeat Dilantin level. Last seizure 07/25/12.    REVIEW OF SYSTEMS: Full 14 system review of systems performed and notable only for those listed, all others are neg:  Constitutional: N/A  Cardiovascular: N/A  Ear/Nose/Throat: N/A  Skin: N/A  Eyes: N/A  Respiratory: N/A  Gastroitestinal: N/A  Hematology/Lymphatic: N/A  Endocrine: N/A Musculoskeletal:N/A  Allergy/Immunology: N/A  Neurological: N/A Psychiatric: N/A   ALLERGIES: No Known Allergies  HOME MEDICATIONS: Outpatient Prescriptions Prior to  Visit  Medication Sig Dispense Refill  . cholecalciferol (VITAMIN D) 1000 UNITS tablet Take 1,000 Units by mouth daily.      . divalproex (DEPAKOTE ER) 500 MG 24 hr tablet Take 1 tablet (500 mg total) by mouth 4 (four) times daily.  120 tablet  5  . ENSURE PLUS (ENSURE PLUS) LIQD Take 237 mLs by mouth daily. Drink one can with  every meal      . Melatonin 1 MG CAPS Take 1 capsule by mouth daily. Takes at 7pm daily       . phenytoin (DILANTIN) 100 MG ER capsule Take 2 capsules (200 mg total) by mouth daily.  60 capsule  12  . QUEtiapine (SEROQUEL) 400 MG tablet Take 400 mg by mouth at bedtime.       . traZODone (DESYREL) 100 MG tablet Take 100 mg by mouth 2 (two) times daily. Take two tablets at bed time      . cloNIDine (CATAPRES - DOSED IN MG/24 HR) 0.1 mg/24hr patch Place 1 patch (0.1 mg total) onto the skin once a week.  4 patch  3   No facility-administered medications prior to visit.    PAST MEDICAL HISTORY: Past Medical History  Diagnosis Date  . Cerebral palsy   . Mental retardation   . Diabetes mellitus   . Dementia   . Seizures   . Gastroesophageal reflux disease   . Acute renal failure   . Constipation   . Speech impairment   . Hypertension   . Anemia     PAST SURGICAL HISTORY: History reviewed. No pertinent past surgical history.  FAMILY HISTORY: History reviewed. No pertinent family history.  SOCIAL HISTORY: History   Social History  . Marital Status: Single    Spouse Name: N/A    Number of Children: N/A  . Years of Education: N/A   Occupational History  . Not on file.   Social History Main Topics  . Smoking status: Never Smoker   . Smokeless tobacco: Never Used  . Alcohol Use: No  . Drug Use: No  . Sexual Activity: No   Other Topics Concern  . Not on file   Social History Narrative  . No narrative on file     PHYSICAL EXAM  Filed Vitals:   02/03/13 1006  BP: 126/80  Pulse: 73  Height: 5\' 5"  (1.651 m)  Weight: 126 lb (57.153 kg)   Body mass index is 20.97 kg/(m^2).  Generalized: Well developed, in no acute distress  Neurological examination   Mentation: Alert difficult to understand he does not follow commands, severe dysarthria  Cranial nerve II-XII: Pupils were equal round reactive to light extraocular movements were full, visual field were full on  confrontational test. Mild left facial weakness, left ptosis. Motor: normal bulk and tone, full strength in the BUE, BLE, choreo athetotic movements involving the distal and proximal musculature in the limbs and face.  Sensory: Withdraws to pinprick  Coordination: Unable to evaluate Reflexes: Brachioradialis 2/2, biceps 2/2, triceps 2/2, patellar 2/2, Achilles absent.   Gait and Station: Rising up from seated position with assistance, slightly ataxic gait, scoliosis, gait belt use by group home representative  DIAGNOSTIC DATA (LABS, IMAGING, TESTING) - I reviewed patient records, labs, notes, testing and imaging myself where available.  Lab Results  Component Value Date   WBC 7.3 08/16/2012   HGB 12.9* 08/16/2012   HCT 38.7* 08/16/2012   MCV 97.0 08/16/2012   PLT 125* 08/16/2012  Component Value Date/Time   NA 137 08/16/2012 1305   K 4.4 08/16/2012 1305   CL 100 08/16/2012 1305   CO2 31 08/16/2012 1305   GLUCOSE 90 08/16/2012 1305   BUN 20 08/16/2012 1305   CREATININE 0.86 08/16/2012 1305   CALCIUM 8.8 08/16/2012 1305   PROT 7.7 02/14/2011 1655   ALBUMIN 4.5 02/14/2011 1655   AST 16 02/14/2011 1655   ALT 13 02/14/2011 1655   ALKPHOS 105 02/14/2011 1655   BILITOT 0.3 02/14/2011 1655   GFRNONAA 85* 08/16/2012 1305   GFRAA >90 08/16/2012 1305          ASSESSMENT AND PLAN  72 y.o. year old male  has a past medical history of Cerebral palsy; Mental retardation; Diabetes mellitus; Dementia; Seizures; Gastroesophageal reflux disease; Acute renal failure; Constipation; Speech impairment; Hypertension; and Anemia. here to followup for seizure disorder. Last seizure 07/25/2012.   Continue Dilantin and Depakote at current doses. Labs reviewed from Wesley Sullivan both Dilantin level and VPA level ok. F/U yearly Call for seizure activity , next visit with WesleyYan Nilda Riggs, [facility], APRN  [facility] [address], [address] [place], [place] [address] [phone]

## 2013-02-03 NOTE — Patient Instructions (Signed)
Per group home sheet 

## 2013-02-18 ENCOUNTER — Telehealth: Payer: Self-pay | Admitting: Nurse Practitioner

## 2013-02-18 DIAGNOSIS — G40909 Epilepsy, unspecified, not intractable, without status epilepticus: Secondary | ICD-10-CM

## 2013-02-18 DIAGNOSIS — Z79899 Other long term (current) drug therapy: Secondary | ICD-10-CM

## 2013-02-18 NOTE — Telephone Encounter (Signed)
Calling to report seizure like activity, drunken like behavior, pt wobbling and slurred speech, she thinks his levels might be to high please call

## 2013-02-24 NOTE — Telephone Encounter (Signed)
TC to group home. Patient will come in to have trough levels done of VPOA and Dilantin. If those are ok will need to follow up with PCP.

## 2013-02-25 ENCOUNTER — Other Ambulatory Visit (INDEPENDENT_AMBULATORY_CARE_PROVIDER_SITE_OTHER): Payer: Medicare Other

## 2013-02-25 DIAGNOSIS — Z79899 Other long term (current) drug therapy: Secondary | ICD-10-CM

## 2013-02-25 DIAGNOSIS — G40909 Epilepsy, unspecified, not intractable, without status epilepticus: Secondary | ICD-10-CM

## 2013-02-25 DIAGNOSIS — Z0289 Encounter for other administrative examinations: Secondary | ICD-10-CM

## 2013-02-26 ENCOUNTER — Telehealth: Payer: Self-pay | Admitting: Nurse Practitioner

## 2013-02-26 LAB — PHENYTOIN LEVEL, TOTAL: PHENYTOIN LVL: 7.4 ug/mL — AB (ref 10.0–20.0)

## 2013-02-26 LAB — VALPROIC ACID LEVEL: VALPROIC ACID LVL: 80 ug/mL (ref 50–100)

## 2013-02-26 MED ORDER — PHENYTOIN 50 MG PO CHEW: 50.0000 mg | CHEWABLE_TABLET | Freq: Every day | ORAL | Status: DC

## 2013-02-26 NOTE — Telephone Encounter (Signed)
TC to group home. Increase Dilantin to 250mg  total dose daily Call for further seizure activity Will fax copy of labs to [phone]

## 2013-03-13 ENCOUNTER — Emergency Department (HOSPITAL_COMMUNITY): Payer: Medicare Other

## 2013-03-13 ENCOUNTER — Telehealth: Payer: Self-pay | Admitting: *Deleted

## 2013-03-13 ENCOUNTER — Encounter (HOSPITAL_COMMUNITY): Payer: Self-pay | Admitting: Emergency Medicine

## 2013-03-13 ENCOUNTER — Emergency Department (HOSPITAL_COMMUNITY)
Admission: EM | Admit: 2013-03-13 | Discharge: 2013-03-13 | Disposition: A | Discharge status: Home / Self Care | Payer: Medicare Other | Attending: Emergency Medicine | Admitting: Emergency Medicine

## 2013-03-13 DIAGNOSIS — S00531A Contusion of lip, initial encounter: Secondary | ICD-10-CM

## 2013-03-13 DIAGNOSIS — R296 Repeated falls: Secondary | ICD-10-CM | POA: Insufficient documentation

## 2013-03-13 DIAGNOSIS — G40909 Epilepsy, unspecified, not intractable, without status epilepticus: Secondary | ICD-10-CM | POA: Insufficient documentation

## 2013-03-13 DIAGNOSIS — S0083XA Contusion of other part of head, initial encounter: Secondary | ICD-10-CM

## 2013-03-13 DIAGNOSIS — T542X1A Toxic effect of corrosive acids and acid-like substances, accidental (unintentional), initial encounter: Secondary | ICD-10-CM | POA: Insufficient documentation

## 2013-03-13 DIAGNOSIS — S0003XA Contusion of scalp, initial encounter: Secondary | ICD-10-CM | POA: Insufficient documentation

## 2013-03-13 DIAGNOSIS — Y939 Activity, unspecified: Secondary | ICD-10-CM | POA: Insufficient documentation

## 2013-03-13 DIAGNOSIS — Z87448 Personal history of other diseases of urinary system: Secondary | ICD-10-CM | POA: Insufficient documentation

## 2013-03-13 DIAGNOSIS — Z8719 Personal history of other diseases of the digestive system: Secondary | ICD-10-CM | POA: Insufficient documentation

## 2013-03-13 DIAGNOSIS — F0390 Unspecified dementia without behavioral disturbance: Secondary | ICD-10-CM | POA: Insufficient documentation

## 2013-03-13 DIAGNOSIS — E119 Type 2 diabetes mellitus without complications: Secondary | ICD-10-CM | POA: Insufficient documentation

## 2013-03-13 DIAGNOSIS — Y9210 Unspecified residential institution as the place of occurrence of the external cause: Secondary | ICD-10-CM | POA: Insufficient documentation

## 2013-03-13 DIAGNOSIS — I10 Essential (primary) hypertension: Secondary | ICD-10-CM | POA: Insufficient documentation

## 2013-03-13 DIAGNOSIS — Z862 Personal history of diseases of the blood and blood-forming organs and certain disorders involving the immune mechanism: Secondary | ICD-10-CM | POA: Insufficient documentation

## 2013-03-13 DIAGNOSIS — Z79899 Other long term (current) drug therapy: Secondary | ICD-10-CM | POA: Insufficient documentation

## 2013-03-13 DIAGNOSIS — T5491XA Toxic effect of unspecified corrosive substance, accidental (unintentional), initial encounter: Secondary | ICD-10-CM | POA: Insufficient documentation

## 2013-03-13 DIAGNOSIS — R4182 Altered mental status, unspecified: Secondary | ICD-10-CM | POA: Insufficient documentation

## 2013-03-13 DIAGNOSIS — T426X1A Poisoning by other antiepileptic and sedative-hypnotic drugs, accidental (unintentional), initial encounter: Secondary | ICD-10-CM

## 2013-03-13 DIAGNOSIS — S1093XA Contusion of unspecified part of neck, initial encounter: Secondary | ICD-10-CM

## 2013-03-13 DIAGNOSIS — W19XXXA Unspecified fall, initial encounter: Secondary | ICD-10-CM

## 2013-03-13 LAB — BASIC METABOLIC PANEL
BUN: 18 mg/dL (ref 6–23)
CALCIUM: 8.9 mg/dL (ref 8.4–10.5)
CO2: 26 mEq/L (ref 19–32)
CREATININE: 0.8 mg/dL (ref 0.50–1.35)
Chloride: 99 mEq/L (ref 96–112)
GFR, EST NON AFRICAN AMERICAN: 88 mL/min — AB (ref >90)
Glucose, Bld: 95 mg/dL (ref 70–99)
Potassium: 4.4 mEq/L (ref 3.7–5.3)
Sodium: 140 mEq/L (ref 137–147)

## 2013-03-13 LAB — CBC WITH DIFFERENTIAL/PLATELET
BASOS ABS: 0 10*3/uL (ref 0.0–0.1)
BASOS PCT: 0 % (ref 0–1)
EOS ABS: 0 10*3/uL (ref 0.0–0.7)
Eosinophils Relative: 0 % (ref 0–5)
HEMATOCRIT: 36.4 % — AB (ref 39.0–52.0)
HEMOGLOBIN: 12.4 g/dL — AB (ref 13.0–17.0)
Lymphocytes Relative: 13 % (ref 12–46)
Lymphs Abs: 0.9 10*3/uL (ref 0.7–4.0)
MCH: 33.6 pg (ref 26.0–34.0)
MCHC: 34.1 g/dL (ref 30.0–36.0)
MCV: 98.6 fL (ref 78.0–100.0)
MONO ABS: 1 10*3/uL (ref 0.1–1.0)
MONOS PCT: 14 % — AB (ref 3–12)
Neutro Abs: 5.2 10*3/uL (ref 1.7–7.7)
Neutrophils Relative %: 74 % (ref 43–77)
Platelets: 111 10*3/uL — ABNORMAL LOW (ref 150–400)
RBC: 3.69 MIL/uL — ABNORMAL LOW (ref 4.22–5.81)
RDW: 12.5 % (ref 11.5–15.5)
WBC: 7.1 10*3/uL (ref 4.0–10.5)

## 2013-03-13 LAB — URINALYSIS, ROUTINE W REFLEX MICROSCOPIC
BILIRUBIN URINE: NEGATIVE
Glucose, UA: NEGATIVE mg/dL
Hgb urine dipstick: NEGATIVE
KETONES UR: NEGATIVE mg/dL
LEUKOCYTES UA: NEGATIVE
NITRITE: NEGATIVE
PROTEIN: NEGATIVE mg/dL
Specific Gravity, Urine: 1.007 (ref 1.005–1.030)
UROBILINOGEN UA: 0.2 mg/dL (ref 0.0–1.0)
pH: 7.5 (ref 5.0–8.0)

## 2013-03-13 LAB — TROPONIN I

## 2013-03-13 LAB — PHENYTOIN LEVEL, TOTAL: Phenytoin Lvl: 15.8 ug/mL (ref 10.0–20.0)

## 2013-03-13 LAB — VALPROIC ACID LEVEL: Valproic Acid Lvl: 104.3 ug/mL — ABNORMAL HIGH (ref 50.0–100.0)

## 2013-03-13 MED ORDER — STERILE WATER FOR INJECTION IJ SOLN
INTRAMUSCULAR | Status: AC
  Administered 2013-03-13: 1 mL
  Filled 2013-03-13: qty 10

## 2013-03-13 MED ORDER — SODIUM CHLORIDE 0.9 % IV BOLUS (SEPSIS)
1000.0000 mL | Freq: Once | INTRAVENOUS | Status: AC
  Administered 2013-03-13: 1000 mL via INTRAVENOUS

## 2013-03-13 MED ORDER — ZIPRASIDONE MESYLATE 20 MG IM SOLR
10.0000 mg | Freq: Once | INTRAMUSCULAR | Status: AC
  Administered 2013-03-13: 10 mg via INTRAMUSCULAR
  Filled 2013-03-13: qty 20

## 2013-03-13 MED ORDER — LORAZEPAM 2 MG/ML IJ SOLN
INTRAMUSCULAR | Status: AC
  Filled 2013-03-13: qty 1

## 2013-03-13 MED ORDER — DIVALPROEX SODIUM 500 MG PO DR TAB: 500.0000 mg | DELAYED_RELEASE_TABLET | Freq: Three times a day (TID) | ORAL | Status: DC

## 2013-03-13 NOTE — ED Notes (Signed)
Per EMS, pt from group home with c/o fall and altered mental status.  Pt fell at group home in the bathroom.  Pt with laceration to lip.  Pt recently had change in medication two weeks ago.  Pt takes dilantin for seizures and had a recent seizure which resulted in medication dose being changed.  Since medication change pt has been lethargic and not acting his norm; pt with unsteady gait.  Pt is nonverbal per group home and is normally ambulatory.  CBG 88.

## 2013-03-13 NOTE — ED Notes (Signed)
During in and out cath, pt became combative and agitated with staff.  Pt kicking and swinging arms.  Caretaker at bedside assisting staff attempting to calm pt.  EDP made aware of situation. EDP at bedside assisting staff.

## 2013-03-13 NOTE — ED Provider Notes (Signed)
CSN: [id number]     Arrival date & time 03/13/13  1547 History   First MD Initiated Contact with Patient 03/13/13 1613     Chief Complaint  Patient presents with  . Fall  . Altered Mental Status   (Consider location/radiation/quality/duration/timing/severity/associated sxs/prior Treatment) Patient is a 73 y.o. male presenting with fall and altered mental status.  Fall  Altered Mental Status  Level 5 caveat due to mental status  Pt brought from group home for evaluation of altered mental status and fall. Per caregiver who has not been at the group home for several days, other staff described him as more lethargic than normal for 3 days. This typically happens after medication changes but last medication change to ?add Depakote was over a week ago. Pt unable to provide any useful history Past Medical History  Diagnosis Date  . Cerebral palsy   . Mental retardation   . Diabetes mellitus   . Dementia   . Seizures   . Gastroesophageal reflux disease   . Acute renal failure   . Constipation   . Speech impairment   . Hypertension   . Anemia    History reviewed. No pertinent past surgical history. History reviewed. No pertinent family history. History  Substance Use Topics  . Smoking status: Never Smoker   . Smokeless tobacco: Never Used  . Alcohol Use: No    Review of Systems Unable to assess due to mental status.   Allergies  Review of patient's allergies indicates no known allergies.  Home Medications   Current Outpatient Rx  Name  Route  Sig  Dispense  Refill  . cholecalciferol (VITAMIN D) 1000 UNITS tablet   Oral   Take 1,000 Units by mouth daily.         Marland Kitchen. EXPIRED: cloNIDine (CATAPRES - DOSED IN MG/24 HR) 0.1 mg/24hr patch   Transdermal   Place 1 patch (0.1 mg total) onto the skin once a week.   4 patch   3   . divalproex (DEPAKOTE ER) 500 MG 24 hr tablet   Oral   Take 1 tablet (500 mg total) by mouth 4 (four) times daily.   120 tablet   11   . ENSURE  PLUS (ENSURE PLUS) LIQD   Oral   Take 237 mLs by mouth daily. Drink one can with every meal         . Melatonin 1 MG CAPS   Oral   Take 1 capsule by mouth daily. Takes at 7pm daily          . phenytoin (DILANTIN) 100 MG ER capsule   Oral   Take 2 capsules (200 mg total) by mouth daily.   60 capsule   12   . phenytoin (DILANTIN) 50 MG tablet   Oral   Chew 1 tablet (50 mg total) by mouth daily. Along with 200mg  for total dose of 250mg    30 tablet   6   . QUEtiapine (SEROQUEL) 25 MG tablet   Oral   Take 25 mg by mouth daily.         . traZODone (DESYREL) 100 MG tablet   Oral   Take 100 mg by mouth 2 (two) times daily. Take two tablets at bed time          BP 122/77  Pulse 80  Temp(Src) 99.3 F (37.4 C) (Rectal)  Resp 27  SpO2 99% Physical Exam  Nursing note and vitals reviewed. Constitutional: He appears well-developed and well-nourished.  HENT:  Head: Normocephalic.  Swollen, contused R lower lip  Eyes: EOM are normal. Pupils are equal, round, and reactive to light.  Neck: Normal range of motion. Neck supple.  Cardiovascular: Normal rate, normal heart sounds and intact distal pulses.   Pulmonary/Chest: Effort normal and breath sounds normal.  Abdominal: Bowel sounds are normal. He exhibits no distension. There is no tenderness.  Musculoskeletal: Normal range of motion. He exhibits no edema and no tenderness.  Neurological: He is alert.  Difficult to fully assess, patient moving all extremities, no facial asymmetry, no focal deficits  Skin: Skin is warm and dry. No rash noted.  Psychiatric:  Pt uncooperative, trying to get out of bed, fighting with nursing staff    ED Course  Procedures (including critical care time) Labs Review Labs Reviewed  CBC WITH DIFFERENTIAL  BASIC METABOLIC PANEL  URINALYSIS, ROUTINE W REFLEX MICROSCOPIC  PHENYTOIN LEVEL, TOTAL  VALPROIC ACID LEVEL  TROPONIN I   Imaging Review No results found.  EKG Interpretation     Date/Time:  Thursday March 13 2013 15:52:32 EST Ventricular Rate:  80 PR Interval:  190 QRS Duration: 78 QT Interval:  362 QTC Calculation: 418 R Axis:   -12 Text Interpretation:  Sinus rhythm No significant change since last tracing Confirmed by SHELDON  MD, CHARLES ([id number]) on 03/13/2013 4:18:15 PM            MDM   1. Fall   2. Contusion, lip   3. Altered mental status     Pt given Geodon for agitation and to facilitate ED evaluation.   7:17 PM Pt calm and cooperative now. Back to baseline per group home staff at bedside. Depakote level borderline elevated may account for drowsiness. Recommend decrease to TID dosing. Followup with PCP for recheck. Return for worsening   Charles B. Bernette Mayers, MD 03/13/13 [phone]

## 2013-03-13 NOTE — Telephone Encounter (Signed)
TC to caregiver, they are at the ER now.

## 2013-03-13 NOTE — Discharge Instructions (Signed)
Altered Mental Status Altered mental status most often refers to an abnormal change in your responsiveness and awareness. It can affect your speech, thought, mobility, memory, attention span, or alertness. It can range from slight confusion to complete unresponsiveness (coma). Altered mental status can be a sign of a serious underlying medical condition. Rapid evaluation and medical treatment is necessary for patients having an altered mental status. CAUSES   Low blood sugar (hypoglycemia) or diabetes.  Severe loss of body fluids (dehydration) or a body salt (electrolyte) imbalance.  A stroke or other neurologic problem, such as dementia or delirium.  A head injury or tumor.  A drug or alcohol overdose.  Exposure to toxins or poisons.  Depression, anxiety, and stress.  A low oxygen level (hypoxia).  An infection.  Blood loss.  Twitching or shaking (seizure).  Heart problems, such as heart attack or heart rhythm problems (arrhythmias).  A body temperature that is too low or too high (hypothermia or hyperthermia). DIAGNOSIS  A diagnosis is based on your history, symptoms, physical and neurologic examinations, and diagnostic tests. Diagnostic tests may include:  Measurement of your blood pressure, pulse, breathing, and oxygen levels (vital signs).  Blood tests.  Urine tests.  X-ray exams.  A computerized magnetic scan (magnetic resonance imaging, MRI).  A computerized X-ray scan (computed tomography, CT scan). TREATMENT  Treatment will depend on the cause. Treatment may include:  Management of an underlying medical or mental health condition.  Critical care or support in the hospital. HOME CARE INSTRUCTIONS   Only take over-the-counter or prescription medicines for pain, discomfort, or fever as directed by your caregiver.  Manage underlying conditions as directed by your caregiver.  Eat a healthy, well-balanced diet to maintain strength.  Join a support group or  prevention program to cope with the condition or trauma that caused the altered mental status. Ask your caregiver to help choose a program that works for you.  Follow up with your caregiver for further examination, therapy, or testing as directed. SEEK MEDICAL CARE IF:   You feel unwell or have chills.  You or your family notice a change in your behavior or your alertness.  You have trouble following your caregiver's treatment plan.  You have questions or concerns. SEEK IMMEDIATE MEDICAL CARE IF:   You have a rapid heartbeat or have chest pain.  You have difficulty breathing.  You have a fever.  You have a headache with a stiff neck.  You cough up blood.  You have blood in your urine or stool.  You have severe agitation or confusion. MAKE SURE YOU:   Understand these instructions.  Will watch your condition.  Will get help right away if you are not doing well or get worse. Document Released: 07/27/2009 Document Revised: 05/01/2011 Document Reviewed: 07/27/2009 [facility] Patient Information 2014 [place].  Facial or Scalp Contusion A facial or scalp contusion is a deep bruise on the face or head. Injuries to the face and head generally cause a lot of swelling, especially around the eyes. Contusions are the result of an injury that caused bleeding under the skin. The contusion may turn blue, purple, or yellow. Minor injuries will give you a painless contusion, but more severe contusions may stay painful and swollen for a few weeks.  CAUSES  A facial or scalp contusion is caused by a blunt injury or trauma to the face or head area.  SIGNS AND SYMPTOMS   Swelling of the injured area.   Discoloration of the injured  area.   Tenderness, soreness, or pain in the injured area.  DIAGNOSIS  The diagnosis can be made by taking a medical history and doing a physical exam. An X-ray exam, CT scan, or MRI may be needed to determine if there are any associated injuries, such  as broken bones (fractures). TREATMENT  Often, the best treatment for a facial or scalp contusion is applying cold compresses to the injured area. Over-the-counter medicines may also be recommended for pain control.  HOME CARE INSTRUCTIONS   Only take over-the-counter or prescription medicines as directed by your health care provider.   Apply ice to the injured area.   Put ice in a plastic bag.   Place a towel between your skin and the bag.   Leave the ice on for 20 minutes, 2 3 times a day.  SEEK MEDICAL CARE IF:  You have bite problems.   You have pain with chewing.   You are concerned about facial defects. SEEK IMMEDIATE MEDICAL CARE IF:  You have severe pain or a headache that is not relieved by medicine.   You have unusual sleepiness, confusion, or personality changes.   You throw up (vomit).   You have a persistent nosebleed.   You have double vision or blurred vision.   You have fluid drainage from your nose or ear.   You have difficulty walking or using your arms or legs.  MAKE SURE YOU:   Understand these instructions.  Will watch your condition.  Will get help right away if you are not doing well or get worse. Document Released: 03/16/2004 Document Revised: 11/27/2012 Document Reviewed: 09/19/2012 [facility] Patient Information 2014 [place], [place].

## 2013-03-13 NOTE — Telephone Encounter (Signed)
Please advise 

## 2013-03-13 NOTE — ED Notes (Signed)
Fall risk armband placed on pt. 

## 2013-03-13 NOTE — ED Notes (Signed)
Pt transported to CT ?

## 2013-03-13 NOTE — ED Notes (Signed)
Pt given 10mg  of Geodon.  Will await for medication to take effect to collect blood work and attempt urine sample again.

## 2013-03-20 ENCOUNTER — Ambulatory Visit: Payer: Medicare Other | Admitting: Nurse Practitioner

## 2013-03-20 ENCOUNTER — Ambulatory Visit (INDEPENDENT_AMBULATORY_CARE_PROVIDER_SITE_OTHER): Payer: Medicare Other | Admitting: Nurse Practitioner

## 2013-03-20 ENCOUNTER — Encounter: Payer: Self-pay | Admitting: Nurse Practitioner

## 2013-03-20 ENCOUNTER — Telehealth: Payer: Self-pay | Admitting: Neurology

## 2013-03-20 VITALS — BP 108/68 | HR 64 | Wt 113.0 lb

## 2013-03-20 DIAGNOSIS — F79 Unspecified intellectual disabilities: Secondary | ICD-10-CM

## 2013-03-20 DIAGNOSIS — Z79899 Other long term (current) drug therapy: Secondary | ICD-10-CM

## 2013-03-20 DIAGNOSIS — R4182 Altered mental status, unspecified: Secondary | ICD-10-CM

## 2013-03-20 DIAGNOSIS — R569 Unspecified convulsions: Secondary | ICD-10-CM

## 2013-03-20 NOTE — Telephone Encounter (Signed)
TC to caregiver Janeece RiggersKrissey. He had a change in his Depakote on ER admission. Platelets low. Need to repeat labs She will bring in at 2pm

## 2013-03-20 NOTE — Telephone Encounter (Signed)
Called patient's emergency contact Lenard LanceCrissy [facility] concerning the message that she left about cancelling the patient's appt. On 03/20/13. She did not understand why, because Eber Jonesarolyn, NP follows the patient's seizure medication. I explained to Crissy that the patient's visit at the ER stated that he was suppose to f/u with his PCP and that is why the provider, Eber Jonesarolyn, NP cancelled the patient's appt. Crissy stated that she would make an appt. With the patient's PCP. I advised the patient's emergency contact Crissy, that if the patient has any other problems, questions or concerns to call the office. Patient's emergency contact verbalized understanding. FYI

## 2013-03-20 NOTE — Telephone Encounter (Signed)
Wesley Sullivan returned called.  She stated she did not understand why Wesley Sullivan's appointment was canceled for today.  She stated his PCP does not handle his seizure medication.  Please call and advise.  Thank you.

## 2013-03-20 NOTE — Patient Instructions (Signed)
Per group home sheet 

## 2013-03-20 NOTE — Progress Notes (Signed)
[facility]  PATIENT: Wesley HeightRobert L Sullivan DOB: Jan 03, 1941   REASON FOR VISIT: Mental status changes, seizure disorder.ER followup    HISTORY OF PRESENT ILLNESS: Wesley Sullivan, 73 year old male returns for followup. As he was last seen in the office 02/03/2013. At that time his levels of Dilantin and Depakote were within normal range. She returns today after an ER visit on 03/13/13  for mental status changes. CT of the brain nothing acute. VPA level 104 and Dilantin level 15.8. His level had been 7.4 (3) weeks ago and valproic acid level had been 80. He has had 2 falls at his group home. He returned for  ER followup. He resides in a group home.     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 Previously Dr Imagene GurneyLove's patient  He was first evaluated by Dr. Lucile ShuttersLyndon Anthony 08/1965 at age 73. At that time he had staring spells. Skull x-rays and EEG were performed and a CT scan of the brain 04/18/1982 was performed. This was normal. Phenobarbital was started. He had multiple ER visits and a history of seizures characterized in the past by his sister as losing postural tone, falling onto the ground, turning stiff, and twisting his head to the left. The episodes would last a few minutes and then he would awaken.  There is a past history of alcohol abuse. He drank "as much as he could get his hands on". He had DTs until approximately 1995, when he [facility] the mental health clinic [facility] He was cared for by his brother until the fall 2012 who stated that his seizures were well controlled on medication. His behavior became bad and [facility]. He refused to take his medicines, became combative, and [facility] at, behavioral  health 12/12. [facility] because of behaioral problems and not taking his medication.  He has seizures approximate one per month as a resident of [facility]. 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 He was last seen in our office 07/04/12 by Dr. Terrace ArabiaYan. He has a history of alcoholic dementia and moderate mental retardation. He was on carbamazepine and Dilantin however his carbamazepine dose was decreased to 200 b.i.d. from t.i.d. and Keppra was added to his regimen. He is currently on valproic sodium 500mg  qid daily, phenytoin 250mg  Daily clonidine 0.1 mg patches change once per week, Seroquel 400 mg at bedtime, trazodone100 mg at bedtime, vitamin D 1000 international units daily, and melatonin 1 mg at night for sleep.  Blood studies 07/10/2011 are phenytoin 18.5, valproic acid level 42.2, normal CBC and CMP.  [facility], his behavior had been doing well.  ER visit 08/16/12 for hypoglycemia with toxic level of Dilantin. His medication was held for 2 days and he returns to get repeat Dilantin level. Last seizure 07/25/12.      REVIEW OF SYSTEMS: Full 14 system review of systems performed and notable only for those listed, all others are neg:  Constitutional: N/A  Cardiovascular: N/A  Ear/Nose/Throat: N/A  Skin: N/A  Eyes: N/A  Respiratory: N/A  Gastroitestinal: N/A  Hematology/Lymphatic: N/A  Endocrine: N/A Musculoskeletal: Gait abnormality Allergy/Immunology: N/A  Neurological: seizure  Psychiatric: agitation   ALLERGIES: No Known Allergies  HOME MEDICATIONS: Outpatient Prescriptions Prior to Visit  Medication Sig Dispense Refill  . cholecalciferol (VITAMIN D) 1000 UNITS tablet Take 1,000 Units by mouth daily.      . divalproex (DEPAKOTE) 500 MG DR tablet Take 1 tablet (500 mg total) by mouth 3 (three) times daily.  90 tablet  0  . ENSURE PLUS (ENSURE PLUS) LIQD Take 237  mLs by mouth daily. Drink one can with every meal      . Melatonin 1 MG CAPS Take 1 capsule by mouth daily. Takes at 7pm daily       . phenytoin (DILANTIN) 100 MG ER capsule Take 2 capsules (200 mg total) by mouth daily.  60 capsule  12  . QUEtiapine (SEROQUEL XR) 400 MG 24 hr tablet Take 400 mg by mouth at bedtime.      Marland Kitchen QUEtiapine (SEROQUEL) 25 MG tablet Take 25 mg by mouth daily.      . traZODone (DESYREL) 100 MG tablet Take 200 mg by mouth at bedtime.       . cloNIDine (CATAPRES - DOSED IN MG/24 HR) 0.1 mg/24hr patch Place 1 patch onto the skin once a week.       No facility-administered medications prior to visit.    PAST MEDICAL HISTORY: Past Medical History  Diagnosis Date  . Cerebral palsy   . Mental retardation   . Diabetes mellitus   . Dementia   . Seizures   . Gastroesophageal reflux disease   . Acute renal failure   . Constipation   . Speech impairment   . Hypertension   . Anemia     PAST SURGICAL HISTORY: No past surgical history on file.  FAMILY HISTORY: No family history on file.  SOCIAL HISTORY: History   Social History  . Marital Status: Single    Spouse Name: N/A    Number of Children: 0  . Years of Education: N/A   Occupational History  . Not on file.   Social History Main Topics  . Smoking status: Never Smoker   . Smokeless tobacco: Never Used  . Alcohol Use: No  . Drug Use: No  . Sexual Activity: No   Other Topics Concern  . Not on file   Social History Narrative   Caffeine consumption is 1 cup daily   Patient does not have any children   Patient is right handed     PHYSICAL EXAM  Filed Vitals:   03/20/13 1356  BP: 108/68  Pulse: 64  Weight: 113 lb (51.256 kg)   Cannot calculate BMI with a Sullivan equal to zero.  Generalized: Well developed, in no acute distress   Neurological examination   Mentation: Alert difficult to understand, he does not follow commands, severe dysarthria( this is not new ) Cranial nerve II-XII:  Pupils were equal round reactive to light extraocular movements appear full, uable to count fingers mild left facial weakness, left ptosis. Motor: normal bulk and tone, full strength in the BUE, BLE, choreo athetotic movements involving the distal and proximal musculature in the limbs and face  Sensory: Withdraws to pain Coordination: Unable to evaluate Reflexes: Brachioradialis 2/2, biceps 2/2, triceps 2/2, patellar 2/2, Achilles absent plantar responses were flexor bilaterally. Gait and Station: Rising up from seated position with assistance, ataxic gait, scoliosis with gait belt used by group home reprensative.  DIAGNOSTIC DATA (LABS, IMAGING, TESTING) - I reviewed patient records, labs, notes, testing and imaging myself  where available.  Lab Results  Component Value Date   WBC 7.1 03/13/2013   HGB 12.4* 03/13/2013   HCT 36.4* 03/13/2013   MCV 98.6 03/13/2013   PLT 111* 03/13/2013      Component Value Date/Time   NA 140 03/13/2013 1722   K 4.4 03/13/2013 1722   CL 99 03/13/2013 1722   CO2 26 03/13/2013 1722   GLUCOSE 95 03/13/2013 1722   BUN 18 03/13/2013 1722   CREATININE 0.80 03/13/2013 1722   CALCIUM 8.9 03/13/2013 1722   PROT 7.7 02/14/2011 1655   ALBUMIN 4.5 02/14/2011 1655   AST 16 02/14/2011 1655   ALT 13 02/14/2011 1655   ALKPHOS 105 02/14/2011 1655   BILITOT 0.3 02/14/2011 1655   GFRNONAA 88* 03/13/2013 1722   GFRAA >90 03/13/2013 1722    ASSESSMENT AND PLAN  73 y.o. year old male  has a past medical history of Cerebral palsy; Mental retardation; Diabetes mellitus; Dementia; Seizures; Gastroesophageal reflux disease; Acute renal failure; Constipation; Speech impairment; Hypertension; and Anemia. here in followup for seizure disorder and mental status changes. Last seizure 02/17/2013.  Reviewed VPA and Dilantin level from the hospital, will recheck since meds were changed.  Will also get ammonia level Continue Depakote and Dilantin at current doses for now Followup in 3  months Nilda Riggs, [facility], [facility], APRN  [facility] [address] [place], [place] [address] [phone]

## 2013-03-21 ENCOUNTER — Telehealth: Payer: Self-pay | Admitting: Nurse Practitioner

## 2013-03-21 DIAGNOSIS — Z79899 Other long term (current) drug therapy: Secondary | ICD-10-CM

## 2013-03-21 DIAGNOSIS — R4182 Altered mental status, unspecified: Secondary | ICD-10-CM

## 2013-03-21 LAB — VALPROIC ACID LEVEL: Valproic Acid Lvl: 92 ug/mL (ref 50–100)

## 2013-03-21 LAB — AMMONIA: Ammonia: 99 ug/dL (ref 27–102)

## 2013-03-21 NOTE — Telephone Encounter (Signed)
TC to MicrosoftChrissy Watson, Decrease Depakote to 500mg  twice daily. Repeat ammonia level, CBC and VPA level in 10 days.  Order faxed to group home

## 2013-03-31 ENCOUNTER — Emergency Department (HOSPITAL_COMMUNITY): Payer: Medicare Other

## 2013-03-31 ENCOUNTER — Encounter (HOSPITAL_COMMUNITY): Payer: Self-pay | Admitting: Emergency Medicine

## 2013-03-31 ENCOUNTER — Inpatient Hospital Stay (HOSPITAL_COMMUNITY)
Admission: EM | Admit: 2013-03-31 | Discharge: 2013-04-11 | DRG: 871 | Disposition: A | Discharge status: Skilled Nursing Facility | Payer: Medicare Other | Attending: Internal Medicine | Admitting: Internal Medicine

## 2013-03-31 DIAGNOSIS — A419 Sepsis, unspecified organism: Secondary | ICD-10-CM

## 2013-03-31 DIAGNOSIS — E870 Hyperosmolality and hypernatremia: Secondary | ICD-10-CM

## 2013-03-31 DIAGNOSIS — G809 Cerebral palsy, unspecified: Secondary | ICD-10-CM | POA: Diagnosis present

## 2013-03-31 DIAGNOSIS — G40909 Epilepsy, unspecified, not intractable, without status epilepticus: Secondary | ICD-10-CM | POA: Diagnosis present

## 2013-03-31 DIAGNOSIS — F1010 Alcohol abuse, uncomplicated: Secondary | ICD-10-CM

## 2013-03-31 DIAGNOSIS — R6520 Severe sepsis without septic shock: Secondary | ICD-10-CM

## 2013-03-31 DIAGNOSIS — K219 Gastro-esophageal reflux disease without esophagitis: Secondary | ICD-10-CM | POA: Diagnosis present

## 2013-03-31 DIAGNOSIS — E43 Unspecified severe protein-calorie malnutrition: Secondary | ICD-10-CM

## 2013-03-31 DIAGNOSIS — F29 Unspecified psychosis not due to a substance or known physiological condition: Secondary | ICD-10-CM

## 2013-03-31 DIAGNOSIS — D72829 Elevated white blood cell count, unspecified: Secondary | ICD-10-CM

## 2013-03-31 DIAGNOSIS — R64 Cachexia: Secondary | ICD-10-CM | POA: Diagnosis present

## 2013-03-31 DIAGNOSIS — E119 Type 2 diabetes mellitus without complications: Secondary | ICD-10-CM

## 2013-03-31 DIAGNOSIS — J189 Pneumonia, unspecified organism: Secondary | ICD-10-CM | POA: Diagnosis present

## 2013-03-31 DIAGNOSIS — Z79899 Other long term (current) drug therapy: Secondary | ICD-10-CM

## 2013-03-31 DIAGNOSIS — D649 Anemia, unspecified: Secondary | ICD-10-CM

## 2013-03-31 DIAGNOSIS — R6250 Unspecified lack of expected normal physiological development in childhood: Secondary | ICD-10-CM | POA: Diagnosis present

## 2013-03-31 DIAGNOSIS — F0390 Unspecified dementia without behavioral disturbance: Secondary | ICD-10-CM | POA: Diagnosis present

## 2013-03-31 DIAGNOSIS — D696 Thrombocytopenia, unspecified: Secondary | ICD-10-CM

## 2013-03-31 DIAGNOSIS — G92 Toxic encephalopathy: Secondary | ICD-10-CM | POA: Diagnosis present

## 2013-03-31 DIAGNOSIS — G929 Unspecified toxic encephalopathy: Secondary | ICD-10-CM

## 2013-03-31 DIAGNOSIS — Z66 Do not resuscitate: Secondary | ICD-10-CM | POA: Diagnosis present

## 2013-03-31 DIAGNOSIS — R4182 Altered mental status, unspecified: Secondary | ICD-10-CM

## 2013-03-31 DIAGNOSIS — J390 Retropharyngeal and parapharyngeal abscess: Secondary | ICD-10-CM

## 2013-03-31 DIAGNOSIS — I10 Essential (primary) hypertension: Secondary | ICD-10-CM

## 2013-03-31 DIAGNOSIS — Z515 Encounter for palliative care: Secondary | ICD-10-CM

## 2013-03-31 DIAGNOSIS — R636 Underweight: Secondary | ICD-10-CM | POA: Diagnosis present

## 2013-03-31 DIAGNOSIS — Z681 Body mass index (BMI) 19 or less, adult: Secondary | ICD-10-CM

## 2013-03-31 DIAGNOSIS — R1310 Dysphagia, unspecified: Secondary | ICD-10-CM | POA: Diagnosis present

## 2013-03-31 DIAGNOSIS — F79 Unspecified intellectual disabilities: Secondary | ICD-10-CM

## 2013-03-31 DIAGNOSIS — I498 Other specified cardiac arrhythmias: Secondary | ICD-10-CM | POA: Diagnosis present

## 2013-03-31 DIAGNOSIS — R6521 Severe sepsis with septic shock: Secondary | ICD-10-CM

## 2013-03-31 DIAGNOSIS — R569 Unspecified convulsions: Secondary | ICD-10-CM

## 2013-03-31 LAB — COMPREHENSIVE METABOLIC PANEL
ALT: 43 U/L (ref 0–53)
AST: 89 U/L — AB (ref 0–37)
Albumin: 2.7 g/dL — ABNORMAL LOW (ref 3.5–5.2)
Alkaline Phosphatase: 99 U/L (ref 39–117)
BILIRUBIN TOTAL: 0.9 mg/dL (ref 0.3–1.2)
BUN: 47 mg/dL — ABNORMAL HIGH (ref 6–23)
CHLORIDE: 110 meq/L (ref 96–112)
CO2: 27 meq/L (ref 19–32)
Calcium: 9.3 mg/dL (ref 8.4–10.5)
Creatinine, Ser: 1.09 mg/dL (ref 0.50–1.35)
GFR calc Af Amer: 77 mL/min — ABNORMAL LOW (ref >90)
GFR, EST NON AFRICAN AMERICAN: 66 mL/min — AB (ref >90)
Glucose, Bld: 130 mg/dL — ABNORMAL HIGH (ref 70–99)
POTASSIUM: 5 meq/L (ref 3.7–5.3)
Sodium: 153 mEq/L — ABNORMAL HIGH (ref 137–147)
Total Protein: 7.8 g/dL (ref 6.0–8.3)

## 2013-03-31 LAB — CBC WITH DIFFERENTIAL/PLATELET
BASOS ABS: 0 10*3/uL (ref 0.0–0.1)
Basophils Relative: 0 % (ref 0–1)
EOS PCT: 0 % (ref 0–5)
Eosinophils Absolute: 0 10*3/uL (ref 0.0–0.7)
HCT: 39.8 % (ref 39.0–52.0)
Hemoglobin: 13.1 g/dL (ref 13.0–17.0)
LYMPHS PCT: 5 % — AB (ref 12–46)
Lymphs Abs: 1.1 10*3/uL (ref 0.7–4.0)
MCH: 33.5 pg (ref 26.0–34.0)
MCHC: 32.9 g/dL (ref 30.0–36.0)
MCV: 101.8 fL — AB (ref 78.0–100.0)
MONOS PCT: 7 % (ref 3–12)
Monocytes Absolute: 1.6 10*3/uL — ABNORMAL HIGH (ref 0.1–1.0)
Neutro Abs: 19.6 10*3/uL — ABNORMAL HIGH (ref 1.7–7.7)
Neutrophils Relative %: 88 % — ABNORMAL HIGH (ref 43–77)
Platelets: 120 10*3/uL — ABNORMAL LOW (ref 150–400)
RBC: 3.91 MIL/uL — AB (ref 4.22–5.81)
RDW: 13.6 % (ref 11.5–15.5)
WBC MORPHOLOGY: INCREASED
WBC: 22.3 10*3/uL — AB (ref 4.0–10.5)

## 2013-03-31 LAB — VALPROIC ACID LEVEL: VALPROIC ACID LVL: 78.3 ug/mL (ref 50.0–100.0)

## 2013-03-31 LAB — URINALYSIS, ROUTINE W REFLEX MICROSCOPIC
Glucose, UA: NEGATIVE mg/dL
KETONES UR: 15 mg/dL — AB
Leukocytes, UA: NEGATIVE
Nitrite: NEGATIVE
PROTEIN: 30 mg/dL — AB
Specific Gravity, Urine: 1.03 (ref 1.005–1.030)
Urobilinogen, UA: 1 mg/dL (ref 0.0–1.0)
pH: 6 (ref 5.0–8.0)

## 2013-03-31 LAB — INFLUENZA PANEL BY PCR (TYPE A & B)
H1N1FLUPCR: NOT DETECTED
Influenza A By PCR: NEGATIVE
Influenza B By PCR: NEGATIVE

## 2013-03-31 LAB — URINE MICROSCOPIC-ADD ON

## 2013-03-31 LAB — LACTIC ACID, PLASMA: Lactic Acid, Venous: 3.7 mmol/L — ABNORMAL HIGH (ref 0.5–2.2)

## 2013-03-31 LAB — GLUCOSE, CAPILLARY
GLUCOSE-CAPILLARY: 100 mg/dL — AB (ref 70–99)
GLUCOSE-CAPILLARY: 118 mg/dL — AB (ref 70–99)

## 2013-03-31 LAB — AMMONIA: AMMONIA: 16 umol/L (ref 11–60)

## 2013-03-31 LAB — MRSA PCR SCREENING: MRSA by PCR: NEGATIVE

## 2013-03-31 MED ORDER — HEPARIN SODIUM (PORCINE) 5000 UNIT/ML IJ SOLN
5000.0000 [IU] | Freq: Three times a day (TID) | INTRAMUSCULAR | Status: DC
  Administered 2013-03-31 – 2013-04-05 (×14): 5000 [IU] via SUBCUTANEOUS
  Filled 2013-03-31 (×17): qty 1

## 2013-03-31 MED ORDER — DIVALPROEX SODIUM 500 MG PO DR TAB
500.0000 mg | DELAYED_RELEASE_TABLET | Freq: Two times a day (BID) | ORAL | Status: DC
  Administered 2013-03-31: 500 mg via ORAL
  Filled 2013-03-31 (×3): qty 1

## 2013-03-31 MED ORDER — HALOPERIDOL LACTATE 5 MG/ML IJ SOLN
2.0000 mg | Freq: Four times a day (QID) | INTRAMUSCULAR | Status: DC | PRN
  Administered 2013-03-31 – 2013-04-03 (×3): 2 mg via INTRAVENOUS
  Filled 2013-03-31 (×3): qty 1

## 2013-03-31 MED ORDER — PIPERACILLIN-TAZOBACTAM 3.375 G IVPB 30 MIN
3.3750 g | Freq: Once | INTRAVENOUS | Status: AC
  Administered 2013-03-31: 3.375 g via INTRAVENOUS
  Filled 2013-03-31: qty 50

## 2013-03-31 MED ORDER — ACETAMINOPHEN 325 MG PO TABS: 650.0000 mg | ORAL_TABLET | Freq: Four times a day (QID) | ORAL | Status: DC | PRN

## 2013-03-31 MED ORDER — VANCOMYCIN HCL IN DEXTROSE 1-5 GM/200ML-% IV SOLN
1000.0000 mg | Freq: Once | INTRAVENOUS | Status: AC
Start: 2013-03-31 — End: 2013-04-02
  Administered 2013-03-31: 1000 mg via INTRAVENOUS
  Filled 2013-03-31: qty 200

## 2013-03-31 MED ORDER — QUETIAPINE FUMARATE 25 MG PO TABS
25.0000 mg | ORAL_TABLET | Freq: Every day | ORAL | Status: DC
  Filled 2013-03-31: qty 1

## 2013-03-31 MED ORDER — OSELTAMIVIR PHOSPHATE 75 MG PO CAPS
75.0000 mg | ORAL_CAPSULE | Freq: Two times a day (BID) | ORAL | Status: DC
  Administered 2013-03-31: 75 mg via ORAL
  Filled 2013-03-31 (×3): qty 1

## 2013-03-31 MED ORDER — SODIUM CHLORIDE 0.9 % IJ SOLN
3.0000 mL | Freq: Two times a day (BID) | INTRAMUSCULAR | Status: DC
  Administered 2013-03-31 – 2013-04-08 (×6): 3 mL via INTRAVENOUS

## 2013-03-31 MED ORDER — POLYETHYLENE GLYCOL 3350 17 G PO PACK
17.0000 g | PACK | Freq: Every day | ORAL | Status: DC | PRN
Start: 2013-03-31 — End: 2013-04-01
  Filled 2013-03-31: qty 1

## 2013-03-31 MED ORDER — ONDANSETRON HCL 4 MG/2ML IJ SOLN: 4.0000 mg | Freq: Four times a day (QID) | INTRAMUSCULAR | Status: DC | PRN

## 2013-03-31 MED ORDER — QUETIAPINE FUMARATE ER 400 MG PO TB24
400.0000 mg | ORAL_TABLET | Freq: Every day | ORAL | Status: DC
  Filled 2013-03-31 (×2): qty 1

## 2013-03-31 MED ORDER — ACETAMINOPHEN 650 MG RE SUPP: 650.0000 mg | Freq: Four times a day (QID) | RECTAL | Status: DC | PRN

## 2013-03-31 MED ORDER — ACETAMINOPHEN 650 MG RE SUPP
650.0000 mg | Freq: Once | RECTAL | Status: AC
  Administered 2013-03-31: 650 mg via RECTAL
  Filled 2013-03-31: qty 1

## 2013-03-31 MED ORDER — ONDANSETRON HCL 4 MG PO TABS: 4.0000 mg | ORAL_TABLET | Freq: Four times a day (QID) | ORAL | Status: DC | PRN

## 2013-03-31 MED ORDER — SODIUM CHLORIDE 0.9 % IV SOLN
INTRAVENOUS | Status: DC
  Administered 2013-03-31 – 2013-04-01 (×2): via INTRAVENOUS

## 2013-03-31 MED ORDER — VANCOMYCIN HCL IN DEXTROSE 1-5 GM/200ML-% IV SOLN
1000.0000 mg | INTRAVENOUS | Status: DC
  Administered 2013-03-31 – 2013-04-04 (×4): 1000 mg via INTRAVENOUS
  Filled 2013-03-31 (×5): qty 200

## 2013-03-31 MED ORDER — ZIPRASIDONE MESYLATE 20 MG IM SOLR
10.0000 mg | Freq: Once | INTRAMUSCULAR | Status: AC
  Administered 2013-03-31: 10 mg via INTRAMUSCULAR
  Filled 2013-03-31: qty 20

## 2013-03-31 MED ORDER — SODIUM CHLORIDE 0.9 % IV SOLN
INTRAVENOUS | Status: AC
  Administered 2013-03-31: 18:00:00 via INTRAVENOUS

## 2013-03-31 MED ORDER — SODIUM CHLORIDE 0.9 % IV BOLUS (SEPSIS)
1000.0000 mL | Freq: Once | INTRAVENOUS | Status: AC
  Administered 2013-03-31: 1000 mL via INTRAVENOUS

## 2013-03-31 MED ORDER — PHENYTOIN SODIUM EXTENDED 100 MG PO CAPS
200.0000 mg | ORAL_CAPSULE | Freq: Every day | ORAL | Status: DC
  Filled 2013-03-31: qty 2

## 2013-03-31 MED ORDER — DEXTROSE 5 % IV SOLN
1.0000 g | Freq: Two times a day (BID) | INTRAVENOUS | Status: DC
  Filled 2013-03-31: qty 1

## 2013-03-31 MED ORDER — DEXTROSE 5 % IV SOLN
2.0000 g | INTRAVENOUS | Status: DC
  Administered 2013-03-31 – 2013-04-01 (×2): 2 g via INTRAVENOUS
  Filled 2013-03-31 (×3): qty 2

## 2013-03-31 NOTE — ED Notes (Signed)
Admitted MD at bedside. MD aware of new temp on pt 100.3. Md asked for 2nd IV on pt. Will notify IV team. Still unable to draw second set of blood cultures. MD aware of hold up with this and will hang antibiotics after second set of blood cultures drawn.

## 2013-03-31 NOTE — ED Notes (Addendum)
[facility], the patient began having extreme weakness to the point where he cannot hold himself up.  Prior to this, he has been able to walk with a gait belt.  The patient does have CP and has a history of seizures.  His sugar was checked this lmorning and it was 118.  Upon his arrival here his BS=100.  He is combative but personnel [facility] say it is normal for him to do this, he also shakes, and his hands are contracted.  Patient is able to mumble to where personnel can understand him.    His PCP checked his amonia level and his depakote levels.  Personnel have paperwork about his results.

## 2013-03-31 NOTE — ED Notes (Signed)
Phlebotomy has been called to collect the second set of blood cultures.

## 2013-03-31 NOTE — ED Provider Notes (Signed)
CSN: [id number]     Arrival date & time 03/31/13  1140 History   First MD Initiated Contact with Patient 03/31/13 1159     Chief Complaint  Patient presents with  . Weakness     (Consider location/radiation/quality/duration/timing/severity/associated sxs/prior Treatment) Patient is a 73 y.o. male presenting with general illness. The history is provided by the patient, the EMS personnel and the nursing home. The history is limited by a developmental delay and the condition of the patient.  Illness Severity:  Moderate Onset quality:  Gradual Duration:  3 days Timing:  Constant Progression:  Unchanged Chronicity:  Recurrent Associated symptoms: fatigue   Associated symptoms: no abdominal pain, no chest pain, no congestion, no diarrhea, no fever, no headaches, no loss of consciousness, no myalgias, no nausea, no rash, no shortness of breath, no vomiting and no wheezing     73 year old male history of cerebral palsy comes in with the chief complaint of weakness. Per nursing home staff has been increasingly weak over the past couple days. Stated that he has had some increased cough over that same time. No noted fever or chills. No vomiting or diarrhea. Has had a recent medication change where they decrease the amount of Depakote. Patient also with decreased oral intake over the past couple days. Per nursing home deny any trauma.  Past Medical History  Diagnosis Date  . Cerebral palsy   . Mental retardation   . Diabetes mellitus   . Dementia   . Seizures   . Gastroesophageal reflux disease   . Acute renal failure   . Constipation   . Speech impairment   . Hypertension   . Anemia    History reviewed. No pertinent past surgical history. History reviewed. No pertinent family history. History  Substance Use Topics  . Smoking status: Never Smoker   . Smokeless tobacco: Never Used  . Alcohol Use: No    Review of Systems  Constitutional: Positive for fatigue. Negative for fever and  chills.  HENT: Negative for congestion and facial swelling.   Eyes: Negative for discharge and visual disturbance.  Respiratory: Negative for shortness of breath and wheezing.   Cardiovascular: Negative for chest pain and palpitations.  Gastrointestinal: Negative for nausea, vomiting, abdominal pain and diarrhea.  Musculoskeletal: Negative for arthralgias and myalgias.  Skin: Negative for color change and rash.  Neurological: Negative for tremors, loss of consciousness, syncope and headaches.  Psychiatric/Behavioral: Negative for confusion and dysphoric mood.      Allergies  Review of patient's allergies indicates no known allergies.  Home Medications   Current Outpatient Rx  Name  Route  Sig  Dispense  Refill  . cholecalciferol (VITAMIN D) 1000 UNITS tablet   Oral   Take 1,000 Units by mouth daily.         . cloNIDine (CATAPRES - DOSED IN MG/24 HR) 0.1 mg/24hr patch   Transdermal   Place 0.1 mg onto the skin once a week.         . divalproex (DEPAKOTE) 500 MG DR tablet   Oral   Take 500 mg by mouth 2 (two) times daily.         Marland Kitchen ENSURE PLUS (ENSURE PLUS) LIQD   Oral   Take 237 mLs by mouth daily. Drink one can with every meal         . Melatonin 1 MG CAPS   Oral   Take 1 capsule by mouth daily. Takes at 7pm daily         .  phenytoin (DILANTIN) 100 MG ER capsule   Oral   Take 2 capsules (200 mg total) by mouth daily.   60 capsule   12   . QUEtiapine (SEROQUEL XR) 400 MG 24 hr tablet   Oral   Take 400 mg by mouth at bedtime.         Marland Kitchen. QUEtiapine (SEROQUEL) 25 MG tablet   Oral   Take 25 mg by mouth daily.         . traZODone (DESYREL) 100 MG tablet   Oral   Take 200 mg by mouth at bedtime.          Marland Kitchen. EXPIRED: cloNIDine (CATAPRES - DOSED IN MG/24 HR) 0.1 mg/24hr patch   Transdermal   Place 1 patch onto the skin once a week.          BP 109/73  Pulse 101  Temp(Src) 100.3 F (37.9 C) (Rectal)  Resp 27  SpO2 93% Physical Exam   Constitutional: He is oriented to person, place, and time. He appears cachectic.  HENT:  Head: Normocephalic and atraumatic.  Eyes: EOM are normal. Pupils are equal, round, and reactive to light.  Neck: Normal range of motion. Neck supple. No JVD present.  Cardiovascular: Normal rate and regular rhythm.  Exam reveals no gallop and no friction rub.   No murmur heard. Pulmonary/Chest: No respiratory distress. He has no wheezes.  Tachypnea  Abdominal: He exhibits no distension. There is no rebound and no guarding.  Musculoskeletal: Normal range of motion.  Neurological: He is alert and oriented to person, place, and time.  Skin: No rash noted. No pallor.  Psychiatric: He has a normal mood and affect. His behavior is normal.    ED Course  Procedures (including critical care time) Labs Review Labs Reviewed  CBC WITH DIFFERENTIAL - Abnormal; Notable for the following:    WBC 22.3 (*)    RBC 3.91 (*)    MCV 101.8 (*)    Platelets 120 (*)    Neutrophils Relative % 88 (*)    Lymphocytes Relative 5 (*)    Neutro Abs 19.6 (*)    Monocytes Absolute 1.6 (*)    All other components within normal limits  COMPREHENSIVE METABOLIC PANEL - Abnormal; Notable for the following:    Sodium 153 (*)    Glucose, Bld 130 (*)    BUN 47 (*)    Albumin 2.7 (*)    AST 89 (*)    GFR calc non Af Amer 66 (*)    GFR calc Af Amer 77 (*)    All other components within normal limits  URINALYSIS, ROUTINE W REFLEX MICROSCOPIC - Abnormal; Notable for the following:    Color, Urine AMBER (*)    Hgb urine dipstick MODERATE (*)    Bilirubin Urine SMALL (*)    Ketones, ur 15 (*)    Protein, ur 30 (*)    All other components within normal limits  LACTIC ACID, PLASMA - Abnormal; Notable for the following:    Lactic Acid, Venous 3.7 (*)    All other components within normal limits  URINE MICROSCOPIC-ADD ON - Abnormal; Notable for the following:    Squamous Epithelial / LPF FEW (*)    All other components within  normal limits  GLUCOSE, CAPILLARY - Abnormal; Notable for the following:    Glucose-Capillary 118 (*)    All other components within normal limits  URINE CULTURE  CULTURE, BLOOD (ROUTINE X 2)  CULTURE, BLOOD (ROUTINE X 2)  VALPROIC ACID LEVEL  AMMONIA  INFLUENZA PANEL BY PCR (TYPE A & B, H1N1)   Imaging Review Dg Chest Portable 1 View  03/31/2013   CLINICAL DATA:  Unresponsive.  EXAM: PORTABLE CHEST - 1 VIEW  COMPARISON:  08/16/2012  FINDINGS: Rotation to left. Distortion of the mediastinal and cardiac silhouette.  Mild central pulmonary vascular prominence greater on the right.  Elevated right hemidiaphragm limiting evaluation of the right lung base.  No segmental consolidation.  Gas-filled bowel.  Left shoulder joint degenerative changes.  IMPRESSION: Rotated portable exam with mild central pulmonary vascular prominence slightly greater on the right.   Electronically Signed   By: Bridgett Larsson M.D.   On: 03/31/2013 13:58    EKG Interpretation   None       MDM   1. Sepsis     73 year old male with a chief complaint of weakness. Patient is then nursing home patient due to cerebral palsy. Weakness and cough over the past 4 or 5 days. Patient less responsive to nursing staff and normal. We'll obtain a chest x-ray,CT, urine, CMP, CBC.   Concern for sepsis. Patient with elevated rectal temperature. Patient with elevated white count. Chest x-ray unremarkable as read by me. Lactic acid is elevated. Patient given 2 fluid boluses. Started on Vanc and zosyn.  Will admit to step down.  Melene Plan, MD 03/31/13 1646  Melene Plan, MD 03/31/13 [phone]

## 2013-03-31 NOTE — Progress Notes (Signed)
RN paged NP stating that pt is a "DNR" at the facility where he lives and family expresses desire to continue that here. This NP called pt's room and spoke to pt's brother, Wesley Sullivan, who is the POA. Brother stated family desired DNR to continue here. He is aware that DNR means no CPR should his brother's heart stop or his brother should stop breathing. Changed order to DNR. Pt is unable to speak for himself as he has a PMHx of mental retardation, dementia, seizures and is non verbal.  Jimmye NormanKaren Kirby-Graham, NP [facility]

## 2013-03-31 NOTE — ED Notes (Signed)
Pt resting in bed with care giver at bedside. Pt has calmed down.

## 2013-03-31 NOTE — ED Notes (Signed)
Notified phlebotomy to please collect second set of blood cultures.

## 2013-03-31 NOTE — ED Notes (Signed)
Pt remains restless thrashing in the bed placed seizure pads in place just for pts safety with bed rails.

## 2013-03-31 NOTE — ED Notes (Signed)
Two RN's attempted to place IV/draw labs after pt received geodon. Pt still restless and requiring help to hold him still. Notified IV team to please try to obtain IV/labs

## 2013-03-31 NOTE — ED Notes (Signed)
Dr. David StallFeliz-Ortiz ordered to go ahead and give antibiotics since it would delay treatment to keep waiting for second set of blood cultures. Will follow out orders

## 2013-03-31 NOTE — Progress Notes (Signed)
ANTIBIOTIC CONSULT NOTE - INITIAL  Pharmacy Consult for vancomycin Indication: pneumonia  No Known Allergies  Patient Measurements: Height: 5\' 6"  (167.6 cm) Weight: 107 lb 12.9 oz (48.9 kg) IBW/kg (Calculated) : 63.8  Vital Signs: Temp: 100.3 F (37.9 C) (02/09 1644) Temp src: Rectal (02/09 1644) BP: 122/63 mmHg (02/09 1815) Pulse Rate: 133 (02/09 1815) Intake/Output from previous day:   Intake/Output from this shift:    Labs:  Recent Labs  03/31/13 1215  WBC 22.3*  HGB 13.1  PLT 120*  CREATININE 1.09   Estimated Creatinine Clearance: 43 ml/min (by C-G formula based on Cr of 1.09). No results found for this basename: VANCOTROUGH, VANCOPEAK, VANCORANDOM, GENTTROUGH, GENTPEAK, GENTRANDOM, TOBRATROUGH, TOBRAPEAK, TOBRARND, AMIKACINPEAK, AMIKACINTROU, AMIKACIN,  in the last 72 hours   Microbiology: No results found for this or any previous visit (from the past 720 hour(s)).  Medical History: Past Medical History  Diagnosis Date  . Cerebral palsy   . Mental retardation   . Diabetes mellitus   . Dementia   . Seizures   . Gastroesophageal reflux disease   . Acute renal failure   . Constipation   . Speech impairment   . Hypertension   . Anemia     Medications:  Prescriptions prior to admission  Medication Sig Dispense Refill  . cholecalciferol (VITAMIN D) 1000 UNITS tablet Take 1,000 Units by mouth daily.      . cloNIDine (CATAPRES - DOSED IN MG/24 HR) 0.1 mg/24hr patch Place 0.1 mg onto the skin once a week.      . divalproex (DEPAKOTE) 500 MG DR tablet Take 500 mg by mouth 2 (two) times daily.      Marland Kitchen. ENSURE PLUS (ENSURE PLUS) LIQD Take 237 mLs by mouth daily. Drink one can with every meal      . Melatonin 1 MG CAPS Take 1 capsule by mouth daily. Takes at 7pm daily      . phenytoin (DILANTIN) 100 MG ER capsule Take 2 capsules (200 mg total) by mouth daily.  60 capsule  12  . QUEtiapine (SEROQUEL XR) 400 MG 24 hr tablet Take 400 mg by mouth at bedtime.      Marland Kitchen.  QUEtiapine (SEROQUEL) 25 MG tablet Take 25 mg by mouth daily.      . traZODone (DESYREL) 100 MG tablet Take 200 mg by mouth at bedtime.       . cloNIDine (CATAPRES - DOSED IN MG/24 HR) 0.1 mg/24hr patch Place 1 patch onto the skin once a week.       Scheduled:  . sodium chloride   Intravenous STAT  . ceFEPime (MAXIPIME) IV  1 g Intravenous Q8H  . divalproex  500 mg Oral BID  . heparin  5,000 Units Subcutaneous Q8H  . oseltamivir  75 mg Oral BID  . phenytoin  200 mg Oral Daily  . QUEtiapine  400 mg Oral QHS  . QUEtiapine  25 mg Oral Daily  . sodium chloride  3 mL Intravenous Q12H    Assessment: 73 yo male here with AMS with possible HCAP to begin vancomycin (also noted on cefepime). WBC= 22.3, tmax= 013.1, SCr= 1.09 and CrCl ~ 43  2/9 cefepime 2/9 vancomycin  2/9 blood x2 2/9 urine  Goal of Therapy:  Vancomycin trough level 15-20 mcg/ml   Plan:  -Vancomycin 1000mg  IV q24h and wil change cefepime to 2gm IV q24hr -Will follow renal function, cultures and clinical progress  Harland Germanndrew Adonis Ryther, Pharm D 03/31/2013 7:55 PM

## 2013-03-31 NOTE — ED Provider Notes (Signed)
I saw and evaluated the patient, reviewed the resident's note and I agree with the findings and plan.  EKG Interpretation   None       Admit for fever without a source. abx given. Lactate elevated but less than 4. tamiflu with flu testing  Lyanne CoKevin M Dominga Mcduffie, MD 03/31/13 1726

## 2013-03-31 NOTE — ED Notes (Signed)
Pt uncooperative, unable to obtain labs, IV or EKG. Pt very restless requiring multiple staff members to control pt. Ordered geodon will give and continue to monitor.

## 2013-03-31 NOTE — ED Notes (Signed)
Pt resting easier. Notified MD of temp 103.1 rectally, pt's HR 109, pt is mentally altered.

## 2013-03-31 NOTE — ED Notes (Signed)
Pt very agitated and will not hold still in bed. Pt able to move arms/legs while moving in bed. Pt will not answer questions.

## 2013-03-31 NOTE — H&P (Addendum)
[facility] History and Physical  Wesley Sullivan VWU:[id number] DOB: Oct 09, 1940 DOA: 03/31/2013  Referring physician: Dr. Patria Mane PCP: [name], MD   Chief Complaint: altered mental status  HPI: Wesley Sullivan is a 73 y.o. male  With past medical history of mental retardation, dementia, seizure disorders and hypertension that comes in for confusion that started 3 days prior to admission to from the hospital and as per caregiver as patient cannot provide any history has been increasingly weak over the past several days he's had some increase in cough over that same time,  he has not had any abdominal pain , fever chest pain diarrhea fever headache loss of consciousness nausea vomiting rash shortness of breath.  In the ED: A basic metabolic panel was done that shows a sodium 153 lactic acid of 3.7 a white count of 22 with an ANC of 19, chest x-ray shows no acute abnormalities and his UA shows a specific gravity of 1030 pregnancy white blood cell count, blood cultures x1 was done in the ED. EKG was done that shows sinus tachycardia excessive deviation and LVH. Patient was very restless in the ED so he got one dose of Geodon. A 1l normal saline was given in the ED  Review of Systems:  Constitutional:  No weight loss, night sweats, Fevers, chills, fatigue.  HEENT:  No headaches, Difficulty swallowing,Tooth/dental problems,Sore throat,  No sneezing, itching, ear ache, nasal congestion, post nasal drip,  Cardio-vascular:  No chest pain, Orthopnea, PND, swelling in lower extremities, anasarca, dizziness, palpitations  GI:  No heartburn, indigestion, abdominal pain, nausea, vomiting, diarrhea, change in bowel habits, loss of appetite  Resp:  No shortness of breath with exertion or at rest. No wheezing.No chest wall deformity  Skin:  no rash or lesions.  GU:  no dysuria, change in color of urine, no urgency or frequency. No flank pain.  Musculoskeletal:  No joint pain or  swelling. No decreased range of motion. No back pain.  Psych:  No change in mood or affect. No depression or anxiety. No memory loss.   Past Medical History  Diagnosis Date  . Cerebral palsy   . Mental retardation   . Diabetes mellitus   . Dementia   . Seizures   . Gastroesophageal reflux disease   . Acute renal failure   . Constipation   . Speech impairment   . Hypertension   . Anemia    History reviewed. No pertinent past surgical history. Social History:  reports that he has never smoked. He has never used smokeless tobacco. He reports that he does not drink alcohol or use illicit drugs.  No Known Allergies  History reviewed. No pertinent family history.   Prior to Admission medications   Medication Sig Start Date End Date Taking? Authorizing Provider  cholecalciferol (VITAMIN D) 1000 UNITS tablet Take 1,000 Units by mouth daily.   Yes Historical Provider, MD  cloNIDine (CATAPRES - DOSED IN MG/24 HR) 0.1 mg/24hr patch Place 0.1 mg onto the skin once a week.   Yes Historical Provider, MD  divalproex (DEPAKOTE) 500 MG DR tablet Take 500 mg by mouth 2 (two) times daily. 03/13/13  Yes Charles B. Bernette Mayers, MD  ENSURE PLUS (ENSURE PLUS) LIQD Take 237 mLs by mouth daily. Drink one can with every meal   Yes Historical Provider, MD  Melatonin 1 MG CAPS Take 1 capsule by mouth daily. Takes at 7pm daily   Yes Historical Provider, MD  phenytoin (DILANTIN) 100 MG ER capsule Take  2 capsules (200 mg total) by mouth daily. 07/18/12  Yes Levert FeinsteinYijun Yan, MD  QUEtiapine (SEROQUEL XR) 400 MG 24 hr tablet Take 400 mg by mouth at bedtime.   Yes Historical Provider, MD  QUEtiapine (SEROQUEL) 25 MG tablet Take 25 mg by mouth daily.   Yes Historical Provider, MD  traZODone (DESYREL) 100 MG tablet Take 200 mg by mouth at bedtime.    Yes Historical Provider, MD  cloNIDine (CATAPRES - DOSED IN MG/24 HR) 0.1 mg/24hr patch Place 1 patch onto the skin once a week. 03/23/11 03/13/13  Laveda Normanhris N Oti, MD   Physical  Exam: Filed Vitals:   03/31/13 1644  BP: 109/73  Pulse:   Temp: 100.3 F (37.9 C)  Resp: 27    BP 109/73  Pulse 101  Temp(Src) 100.3 F (37.9 C) (Rectal)  Resp 27  SpO2 93%  General:   restless in bed, patient is nonverbal, and restless  Eyes: PERRL, normal lids, irises & conjunctiva ENT:  extremely dry, lips & tongue, Some cavities on the molars Neck: no LAD, masses or thyromegaly Cardiovascular: RRR, no m/r/g. No LE edema. Respiratory: CTA bilaterally, no w/r/r. Normal respiratory effort. Abdomen: soft, ntnd Skin: no rash or induration seen on limited exam Musculoskeletal: grossly normal tone BUE/BLE Psychiatric:  cannot assess  Neurologic:  moving all 4 extremities without any difficulties.           Labs on Admission:  Basic Metabolic Panel:  Recent Labs Lab 03/31/13 1215  NA 153*  K 5.0  CL 110  CO2 27  GLUCOSE 130*  BUN 47*  CREATININE 1.09  CALCIUM 9.3   Liver Function Tests:  Recent Labs Lab 03/31/13 1215  AST 89*  ALT 43  ALKPHOS 99  BILITOT 0.9  PROT 7.8  ALBUMIN 2.7*   No results found for this basename: LIPASE, AMYLASE,  in the last 168 hours No results found for this basename: AMMONIA,  in the last 168 hours CBC:  Recent Labs Lab 03/31/13 1215  WBC 22.3*  NEUTROABS 19.6*  HGB 13.1  HCT 39.8  MCV 101.8*  PLT 120*   Cardiac Enzymes: No results found for this basename: CKTOTAL, CKMB, CKMBINDEX, TROPONINI,  in the last 168 hours  BNP (last 3 results) No results found for this basename: PROBNP,  in the last 8760 hours CBG:  Recent Labs Lab 03/31/13 1402  GLUCAP 118*    Radiological Exams on Admission: Dg Chest Portable 1 View  03/31/2013   CLINICAL DATA:  Unresponsive.  EXAM: PORTABLE CHEST - 1 VIEW  COMPARISON:  08/16/2012  FINDINGS: Rotation to left. Distortion of the mediastinal and cardiac silhouette.  Mild central pulmonary vascular prominence greater on the right.  Elevated right hemidiaphragm limiting evaluation of  the right lung base.  No segmental consolidation.  Gas-filled bowel.  Left shoulder joint degenerative changes.  IMPRESSION: Rotated portable exam with mild central pulmonary vascular prominence slightly greater on the right.   Electronically Signed   By: Bridgett LarssonSteve  Olson M.D.   On: 03/31/2013 13:58    EKG: Independently reviewed. Sinus tachycardia left axis deviation and LVH  Assessment/Plan Sepsis/ Leukocytosis/ Toxic encephalopathy: - Unclear source of this infectious etiology, as per caregiver he did have some cough. He does go to eat with other residents at this facility. His white count is 22, has a lactic acid of 3.7  So we'll start him on empiric antibiotic for possible healthcare associated pneumonia with vancomycin and cefepime. His UA does not appear to be  the source of infection. In the ED they are having trouble getting a second blood culture, as the patient is restless. We will then start him on empiric vancomycin and cefepime without the second blood culture. He got Geodon and in ED.  - He also has high fevers which makes the concern for influenza. So get influenza PCR, and we'll start him on Tamiflu empirically. If this comes back negative it could be stopped tomorrow.  - I will give him additional bolus of normal saline, then continue normal saline. We'll place a Foley to monitor strict I.'s and O.'s.  - At this time has not hypotensive and his heart rate is improved we'll not start empiric steroids.  - His toxic metabolic encephalopathy is probably a combination of infectious etiology and decreased intravascular volume.  Chronic Thrombocytopenia: - Currently stable continue to monitor. Check a PT and INR.  MENTAL RETARDATION - Continue Depakote syrup. Electrodes within normal limits we'll use elbow when necessary for agitation.  HTN (hypertension) - Hold blood pressure medication.  Hypernatremia - Days due to decreased intravascular volume, as seen by an elevated lactic acid.  We'll go ahead and start him on normal saline. Recheck a basic metabolic panel in the morning.   Severe protein caloric malnutrion: - NPO for now. - once pt more alert can start a diet. - SLP and d/w family GOC.  Code Status: presumed full Family Communication: none at bedside no answer form Benetta Spar [phone] home phone number   Disposition Plan: inpatient SDU  Time spent: 85 minutes  Marinda Elk [facility] Pager [phone]

## 2013-04-01 ENCOUNTER — Inpatient Hospital Stay (HOSPITAL_COMMUNITY): Payer: Medicare Other

## 2013-04-01 DIAGNOSIS — A419 Sepsis, unspecified organism: Principal | ICD-10-CM

## 2013-04-01 DIAGNOSIS — D72829 Elevated white blood cell count, unspecified: Secondary | ICD-10-CM

## 2013-04-01 DIAGNOSIS — E870 Hyperosmolality and hypernatremia: Secondary | ICD-10-CM

## 2013-04-01 DIAGNOSIS — E43 Unspecified severe protein-calorie malnutrition: Secondary | ICD-10-CM

## 2013-04-01 LAB — COMPREHENSIVE METABOLIC PANEL
ALBUMIN: 2.2 g/dL — AB (ref 3.5–5.2)
ALK PHOS: 131 U/L — AB (ref 39–117)
ALT: 34 U/L (ref 0–53)
AST: 68 U/L — AB (ref 0–37)
BILIRUBIN TOTAL: 0.7 mg/dL (ref 0.3–1.2)
BUN: 35 mg/dL — ABNORMAL HIGH (ref 6–23)
CO2: 26 meq/L (ref 19–32)
Calcium: 8.2 mg/dL — ABNORMAL LOW (ref 8.4–10.5)
Chloride: 115 mEq/L — ABNORMAL HIGH (ref 96–112)
Creatinine, Ser: 0.94 mg/dL (ref 0.50–1.35)
GFR calc Af Amer: >90 mL/min (ref >90)
GFR, EST NON AFRICAN AMERICAN: 82 mL/min — AB (ref >90)
Glucose, Bld: 125 mg/dL — ABNORMAL HIGH (ref 70–99)
POTASSIUM: 4.1 meq/L (ref 3.7–5.3)
Sodium: 151 mEq/L — ABNORMAL HIGH (ref 137–147)
Total Protein: 6.5 g/dL (ref 6.0–8.3)

## 2013-04-01 LAB — LACTIC ACID, PLASMA: Lactic Acid, Venous: 2.1 mmol/L (ref 0.5–2.2)

## 2013-04-01 LAB — CBC
HCT: 33 % — ABNORMAL LOW (ref 39.0–52.0)
Hemoglobin: 10.9 g/dL — ABNORMAL LOW (ref 13.0–17.0)
MCH: 33.6 pg (ref 26.0–34.0)
MCHC: 33 g/dL (ref 30.0–36.0)
MCV: 101.9 fL — AB (ref 78.0–100.0)
Platelets: 123 10*3/uL — ABNORMAL LOW (ref 150–400)
RBC: 3.24 MIL/uL — ABNORMAL LOW (ref 4.22–5.81)
RDW: 13.8 % (ref 11.5–15.5)
WBC: 19.8 10*3/uL — ABNORMAL HIGH (ref 4.0–10.5)

## 2013-04-01 LAB — URINE CULTURE
Colony Count: NO GROWTH
Culture: NO GROWTH

## 2013-04-01 LAB — LEGIONELLA ANTIGEN, URINE: LEGIONELLA ANTIGEN, URINE: NEGATIVE

## 2013-04-01 LAB — STREP PNEUMONIAE URINARY ANTIGEN: STREP PNEUMO URINARY ANTIGEN: POSITIVE — AB

## 2013-04-01 LAB — HIV ANTIBODY (ROUTINE TESTING W REFLEX): HIV: NONREACTIVE

## 2013-04-01 MED ORDER — PHENYTOIN SODIUM 50 MG/ML IJ SOLN
100.0000 mg | Freq: Two times a day (BID) | INTRAMUSCULAR | Status: DC
  Administered 2013-04-01 – 2013-04-05 (×9): 100 mg via INTRAVENOUS
  Filled 2013-04-01 (×10): qty 2

## 2013-04-01 MED ORDER — OSELTAMIVIR PHOSPHATE 30 MG PO CAPS
30.0000 mg | ORAL_CAPSULE | Freq: Two times a day (BID) | ORAL | Status: DC
  Filled 2013-04-01 (×2): qty 1

## 2013-04-01 MED ORDER — VALPROATE SODIUM 500 MG/5ML IV SOLN
500.0000 mg | Freq: Two times a day (BID) | INTRAVENOUS | Status: DC
  Administered 2013-04-01 – 2013-04-04 (×8): 500 mg via INTRAVENOUS
  Filled 2013-04-01 (×13): qty 5

## 2013-04-01 MED ORDER — DEXTROSE-NACL 5-0.45 % IV SOLN
INTRAVENOUS | Status: DC
  Administered 2013-04-01: 100 mL/h via INTRAVENOUS

## 2013-04-01 MED ORDER — LORAZEPAM 2 MG/ML IJ SOLN
0.5000 mg | INTRAMUSCULAR | Status: AC | PRN
  Administered 2013-04-01 – 2013-04-02 (×2): 0.5 mg via INTRAVENOUS
  Filled 2013-04-01 (×2): qty 1

## 2013-04-01 MED ORDER — WHITE PETROLATUM GEL
Status: AC
  Administered 2013-04-01: 1
  Filled 2013-04-01: qty 5

## 2013-04-01 NOTE — Progress Notes (Signed)
Utilization Review Completed.  

## 2013-04-01 NOTE — Progress Notes (Signed)
INITIAL NUTRITION ASSESSMENT  DOCUMENTATION CODES Per approved criteria  -Severe malnutrition in the context of chronic illness -Underweight   INTERVENTION: Recommend Ensure Complete po BID, each supplement provides 350 kcal and 13 grams of protein, once diet order permits. RD to continue to follow nutrition care plan.  NUTRITION DIAGNOSIS: Inadequate oral intake related to inability to eat as evidenced by NPO status.   Goal: Diet advancement as tolerated. Intake to meet >90% of estimated nutrition needs.  Monitor:  weight trends, lab trends, I/O's, diet advancement, PO intake  Reason for Assessment: Malnutrition Screening Tool  73 y.o. male  Admitting Dx:   ASSESSMENT: PMHx significant for mental retardation, dementia, seizure and HTN. Pt lives in a group home. Admitted with increasing weakness and cough x several days. Negative for influenza. Work-up ongoing.  Pt with Ensure Plus TID with meals on MAR.  From group home, however group home believes that pt may need higher level of care upon d/c. Social worker following.  Sodium is elevated at 151 but trending down.  Pt is unable to answer my questions.  Nutrition Focused Physical Exam:  Subcutaneous Fat:  Orbital Region: severe depletion Upper Arm Region: severe depletion Thoracic and Lumbar Region: severe depletion  Muscle:  Temple Region: severe depletion Clavicle Bone Region: severe depletion Clavicle and Acromion Bone Region: severe depletion Scapular Bone Region: severe depletion Dorsal Hand: severe depletion Patellar Region: severe depletion Anterior Thigh Region: severe depletion Posterior Calf Region: severe depletion  Edema: none  Pt meets criteria for severe MALNUTRITION in the context of chronic illness as evidenced by severe fat and muscle mass loss.   Height: Ht Readings from Last 1 Encounters:  03/31/13 5\' 6"  (1.676 m)    Weight: Wt Readings from Last 1 Encounters:  04/01/13 107 lb  12.9 oz (48.9 kg)    Ideal Body Weight: 142 lb  % Ideal Body Weight: 75%  Wt Readings from Last 10 Encounters:  04/01/13 107 lb 12.9 oz (48.9 kg)  03/20/13 113 lb (51.256 kg)  02/03/13 126 lb (57.153 kg)  08/20/12 112 lb (50.803 kg)  07/04/12 126 lb 8 oz (57.38 kg)  02/16/11 124 lb 11.2 oz (56.564 kg)  06/10/07 108 lb 12.8 oz (49.351 kg)    Usual Body Weight: 113 lb  % Usual Body Weight: 95%  BMI:  Body mass index is 17.41 kg/(m^2). Underweight  Estimated Nutritional Needs: Kcal: 1100 - 1400 Protein: 49 - 59 g Fluid: 1.1 - 1.4 liters  Skin: leg abrasion  Diet Order: NPO  EDUCATION NEEDS: -No education needs identified at this time   Intake/Output Summary (Last 24 hours) at 04/01/13 1335 Last data filed at 04/01/13 1200  Gross per 24 hour  Intake   1375 ml  Output    300 ml  Net   1075 ml    Last BM: PTA  Labs:   Recent Labs Lab 03/31/13 1215 04/01/13 0223  NA 153* 151*  K 5.0 4.1  CL 110 115*  CO2 27 26  BUN 47* 35*  CREATININE 1.09 0.94  CALCIUM 9.3 8.2*  GLUCOSE 130* 125*    CBG (last 3)   Recent Labs  03/31/13 1140 03/31/13 1402  GLUCAP 100* 118*    Scheduled Meds: . ceFEPime (MAXIPIME) IV  2 g Intravenous Q24H  . heparin  5,000 Units Subcutaneous Q8H  . phenytoin (DILANTIN) IV  100 mg Intravenous BID  . QUEtiapine  400 mg Oral QHS  . QUEtiapine  25 mg Oral Daily  .  sodium chloride  3 mL Intravenous Q12H  . valproate sodium  500 mg Intravenous Q12H  . vancomycin  1,000 mg Intravenous Q24H    Continuous Infusions: . dextrose 5 % and 0.45% NaCl 100 mL/hr (04/01/13 1226)    Past Medical History  Diagnosis Date  . Cerebral palsy   . Mental retardation   . Diabetes mellitus   . Dementia   . Seizures   . Gastroesophageal reflux disease   . Acute renal failure   . Constipation   . Speech impairment   . Hypertension   . Anemia     History reviewed. No pertinent past surgical history.  Jarold Motto MS, RD,  LDN Pager: [phone] After-hours pager: [phone]

## 2013-04-01 NOTE — Progress Notes (Signed)
[facility] PROGRESS NOTE  ALEE KATEN ZOX:[id number] DOB: Aug 14, 1940 DOA: 03/31/2013 PCP: [name], MD Brief HPI: Wesley Sullivan is a 73 y.o. male  Has  past medical history of mental retardation, dementia, seizure disorders and hypertension that comes in for confusion that started 3 days prior to admission to from the hospital and as per caregiver as patient cannot provide any history . He was found to be dehydrated, hypernatremic, febrile with leukocytosis, . He was admitted to medical service and is being managed for health care associated pneumonia and acute encephalopathy. His influenza PCR is negative. His urine for streptococcal antigen is positive.   Assessment/Plan: Sepsis/ Leukocytosis/ Toxic encephalopathy:  - Unclear source of this infectious etiology, possibly HCAP.  we'll start him on empiric antibiotic for possible healthcare associated pneumonia with vancomycin and cefepime. HIS urine was positive for streptococcal antigen.  His UA does not appear to be the source of infection.   We'll place a Foley to monitor strict I.'s and O.'s.  - His toxic metabolic encephalopathy is probably a combination of infectious etiology and decreased intravascular volume.  Chronic Thrombocytopenia:  - Currently stable continue to monitor. MENTAL RETARDATION  - resume home medications IV.  HTN (hypertension)  - controlled.  Hypernatremia  - Days due to decreased intravascular volume, as seen by an elevated lactic acid. We'll go ahead and start him on normal saline. Recheck a basic metabolic panel in the morning shows persistent hypernatremia.  Severe protein caloric malnutrion:  - NPO for now.  - SLP eval ordered and recommended NPO till his mentation improves.  - recommend palliative care consult for GOC.  DVT prophylaxis. Sq heparin.      Code Status: DNR Family Communication: none at bedside, discussed with family on admission Disposition Plan: remains in stepdown.     Consultants:  none  Procedures:  none  Antibiotics:  Vancomycin 2/9  Cefepime 2/9  HPI/Subjective: Confused, non communicative.   Objective: Filed Vitals:   04/01/13 0737  BP: 116/80  Pulse: 93  Temp: 98 F (36.7 C)  Resp: 19    Intake/Output Summary (Last 24 hours) at 04/01/13 0955 Last data filed at 04/01/13 0800  Gross per 24 hour  Intake    875 ml  Output    300 ml  Net    575 ml   Filed Weights   03/31/13 1900 04/01/13 0425  Weight: 48.9 kg (107 lb 12.9 oz) 48.9 kg (107 lb 12.9 oz)    Exam:   General:  Alert , confused. Slightly agitated.   Cardiovascular: s1s2  Respiratory: scattered rhonchi.   Abdomen: soft NT .   Musculoskeletal: Multiple scab wounds on the legs, no pedal edema.   Data Reviewed: Basic Metabolic Panel:  Recent Labs Lab 03/31/13 1215 04/01/13 0223  NA 153* 151*  K 5.0 4.1  CL 110 115*  CO2 27 26  GLUCOSE 130* 125*  BUN 47* 35*  CREATININE 1.09 0.94  CALCIUM 9.3 8.2*   Liver Function Tests:  Recent Labs Lab 03/31/13 1215 04/01/13 0223  AST 89* 68*  ALT 43 34  ALKPHOS 99 131*  BILITOT 0.9 0.7  PROT 7.8 6.5  ALBUMIN 2.7* 2.2*   No results found for this basename: LIPASE, AMYLASE,  in the last 168 hours  Recent Labs Lab 03/31/13 1804  AMMONIA 16   CBC:  Recent Labs Lab 03/31/13 1215 04/01/13 0223  WBC 22.3* 19.8*  NEUTROABS 19.6*  --   HGB 13.1 10.9*  HCT 39.8  33.0*  MCV 101.8* 101.9*  PLT 120* 123*   Cardiac Enzymes: No results found for this basename: CKTOTAL, CKMB, CKMBINDEX, TROPONINI,  in the last 168 hours BNP (last 3 results) No results found for this basename: PROBNP,  in the last 8760 hours CBG:  Recent Labs Lab 03/31/13 1140 03/31/13 1402  GLUCAP 100* 118*    Recent Results (from the past 240 hour(s))  CULTURE, BLOOD (ROUTINE X 2)     Status: None   Collection Time    03/31/13  2:40 PM      Result Value Range Status   Specimen Description BLOOD RIGHT FOREARM    Final   Special Requests BOTTLES DRAWN AEROBIC AND ANAEROBIC 5CC   Final   Culture  Setup Time     Final   Value: 03/31/2013 21:13     Performed at [facility]   Culture     Final   Value:        BLOOD CULTURE RECEIVED NO GROWTH TO DATE CULTURE WILL BE HELD FOR 5 DAYS BEFORE ISSUING A FINAL NEGATIVE REPORT     Performed at [facility]   Report Status PENDING   Incomplete  CULTURE, BLOOD (ROUTINE X 2)     Status: None   Collection Time    03/31/13  6:10 PM      Result Value Range Status   Specimen Description BLOOD ARM LEFT   Final   Special Requests BOTTLES DRAWN AEROBIC AND ANAEROBIC B 10CC R 5CC   Final   Culture  Setup Time     Final   Value: 03/31/2013 21:13     Performed at [facility]   Culture     Final   Value:        BLOOD CULTURE RECEIVED NO GROWTH TO DATE CULTURE WILL BE HELD FOR 5 DAYS BEFORE ISSUING A FINAL NEGATIVE REPORT     Performed at [facility]   Report Status PENDING   Incomplete  MRSA PCR SCREENING     Status: None   Collection Time    03/31/13  7:17 PM      Result Value Range Status   MRSA by PCR NEGATIVE  NEGATIVE Final   Comment:            The GeneXpert MRSA Assay (FDA     approved for NASAL specimens     only), is one component of a     comprehensive MRSA colonization     surveillance program. It is not     intended to diagnose MRSA     infection nor to guide or     monitor treatment for     MRSA infections.     Studies: Portable Chest 1 View  04/01/2013   CLINICAL DATA:  Cough and congestion  EXAM: PORTABLE CHEST - 1 VIEW  COMPARISON:  DG CHEST 1V PORT dated 03/31/2013  FINDINGS: The heart size and mediastinal contours are within normal limits. The visualized skeletal structures are unremarkable. Cardiac leads obscure detail. Mild central vascular fullness persists without overt edema. Probable crowding of the bronchovascular markings over the right lower lobe allowing for hypoaeration. Lungs are otherwise clear.   IMPRESSION: Probable crowding of bronchovascular markings over the right lower lobe although early pneumonia or atelectasis could appear similar. If symptoms persist, consider PA and lateral chest radiographs obtained at full inspiration when the patient is clinically able.   Electronically Signed   By: Lucio EdwardGretchen  Green M.D.  On: 04/01/2013 07:41   Dg Chest Portable 1 View  03/31/2013   CLINICAL DATA:  Unresponsive.  EXAM: PORTABLE CHEST - 1 VIEW  COMPARISON:  08/16/2012  FINDINGS: Rotation to left. Distortion of the mediastinal and cardiac silhouette.  Mild central pulmonary vascular prominence greater on the right.  Elevated right hemidiaphragm limiting evaluation of the right lung base.  No segmental consolidation.  Gas-filled bowel.  Left shoulder joint degenerative changes.  IMPRESSION: Rotated portable exam with mild central pulmonary vascular prominence slightly greater on the right.   Electronically Signed   By: Bridgett Larsson M.D.   On: 03/31/2013 13:58    Scheduled Meds: . ceFEPime (MAXIPIME) IV  2 g Intravenous Q24H  . divalproex  500 mg Oral BID  . heparin  5,000 Units Subcutaneous Q8H  . oseltamivir  75 mg Oral BID  . phenytoin  200 mg Oral Daily  . QUEtiapine  400 mg Oral QHS  . QUEtiapine  25 mg Oral Daily  . sodium chloride  3 mL Intravenous Q12H  . vancomycin  1,000 mg Intravenous Q24H   Continuous Infusions: . sodium chloride 125 mL/hr at 04/01/13 9604    Active Problems:   THROMBOCYTOPENIA   MENTAL RETARDATION   HTN (hypertension)   Sepsis   Hypernatremia   Leukocytosis   Toxic encephalopathy   Severe protein-calorie malnutrition   Septic shock    Time spent: 35 min    Ashely Goosby  [facility] Pager [phone]. If 7PM-7AM, please contact night-coverage at [email or web address], password [facility], [place] 04/01/2013, 9:55 AM  LOS: 1 day

## 2013-04-01 NOTE — Evaluation (Signed)
Clinical/Bedside Swallow Evaluation Patient Details  Name: Wesley Sullivan MRN: [id number] Date of Birth: 09-Jul-1940  Today's Date: 04/01/2013 Time: 1500-1520 SLP Time Calculation (min): 20 min  Past Medical History:  Past Medical History  Diagnosis Date  . Cerebral palsy   . Mental retardation   . Diabetes mellitus   . Dementia   . Seizures   . Gastroesophageal reflux disease   . Acute renal failure   . Constipation   . Speech impairment   . Hypertension   . Anemia    Past Surgical History: History reviewed. No pertinent past surgical history. HPI:  Wesley Sullivan is a 73 y.o. male with past medical history of mental retardation, dementia, seizure disorders and hypertension that comes in for confusion that started 3 days prior to admission to from the hospital and as per caregiver as patient cannot provide any history has been increasingly weak over the past several days he's had some increase in cough over that same time, he has not had any abdominal pain , fever chest pain diarrhea fever headache loss of consciousness nausea vomiting rash shortness of breath.  MD suspects CAP, but unsure of source of infection. An MBS was perfomed in 2010, full report not available. MD note states,  'He was found to have silent aspiration with oropharyngeal dysphagia. During the MBS swallow, he was found to have a large cervical osteophyte with pressure effect on the oropharynx and nasopharynx. 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 This finding was discussed with his family and he was agreed that he was not a candidate for any surgical procedure, and therefore, will be slowly advanced on his diet on pureed diet recommended by the speech therapist." Unsure of liquid texture recommendation.    Assessment / Plan / Recommendation Clinical Impression  Pt demonstrates  limited to no awareness of PO. Any oral care or PO trials triggers writhing oral movement with lack of fucntional maniplation of bolus. Pt is not appropriate for diet as this time as he does not actively transit bolus or trigger swallow. He is at high risk of aspiration, beyond baseline chronic dysphagia. Pt is recommended to remain NPO until mentation improves.     Aspiration Risk  Moderate    Diet Recommendation NPO   Medication Administration: Via alternative means    Other  Recommendations Oral Care Recommendations: Oral care Q4 per protocol   Follow Up Recommendations  24 hour supervision/assistance    Frequency and Duration min 2x/week  2 weeks   Pertinent Vitals/Pain NA    SLP Swallow Goals     Swallow Study Prior Functional Status       General HPI: Wesley Sullivan is a 73 y.o. male with past medical history of mental retardation, dementia, seizure disorders and hypertension that comes in for confusion that started 3 days prior to admission to from the hospital and as per caregiver as patient cannot provide any history has been increasingly weak over the past several days he's had some increase in cough over that same time, he has not had any abdominal pain , fever chest pain diarrhea fever headache loss of consciousness nausea vomiting rash shortness of breath.  MD suspects CAP, but unsure of source of infection. An MBS was perfomed in 2010, full report not available. MD note states,  'He was found to have silent aspiration with oropharyngeal dysphagia. During  the MBS swallow, he was found to have a large cervical osteophyte with pressure effect on the oropharynx and nasopharynx. 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 This finding was discussed with his family and he was agreed that he was not a candidate for any surgical procedure, and therefore,  will be slowly advanced on his diet on pureed diet recommended by the speech therapist." Unsure of liquid texture recommendation.  Type of Study: Bedside swallow evaluation Previous Swallow Assessment: MBS 2010 - silent aspiration of thin liquids. Unsure of SLP recs Diet Prior to this Study: NPO Temperature Spikes Noted: Yes Respiratory Status: Room air History of Recent Intubation: No Behavior/Cognition: Alert;Doesn't follow directions;Confused Oral Cavity - Dentition: Poor condition;Missing dentition Self-Feeding Abilities: Total assist Patient Positioning: Upright in bed Baseline Vocal Quality: Other (comment);Clear Volitional Cough: Cognitively unable to elicit Volitional Swallow: Unable to elicit    Oral/Motor/Sensory Function Overall Oral Motor/Sensory Function: Impaired (groping/writhing oral movement, strong. )   Ice Chips     Thin Liquid Thin Liquid: Impaired Presentation: Straw;Cup Oral Phase Impairments: Reduced labial seal;Impaired anterior to posterior transit;Reduced lingual movement/coordination;Poor awareness of bolus Oral Phase Functional Implications: Oral holding;Oral residue;Prolonged oral transit Pharyngeal  Phase Impairments: Suspected delayed Swallow;Multiple swallows    Nectar Thick Nectar Thick Liquid: Not tested   Honey Thick Honey Thick Liquid: Not tested   Puree Puree: Impaired Presentation: Spoon Oral Phase Impairments: Reduced labial seal;Reduced lingual movement/coordination;Impaired anterior to posterior transit;Poor awareness of bolus Oral Phase Functional Implications: Prolonged oral transit;Oral residue;Oral holding Pharyngeal Phase Impairments: Suspected delayed Swallow;Multiple swallows   Solid   GO    Solid: Not tested      Harlon Ditty, MA CCC-SLP [phone]  Apolonio Cutting, Riley Nearing 04/01/2013,3:27 PM

## 2013-04-01 NOTE — Progress Notes (Addendum)
Clinical Social Work Department BRIEF PSYCHOSOCIAL ASSESSMENT 04/01/2013  Patient:  Wesley Sullivan, Wesley Sullivan     Account Number:  [id number]     Admit date:  03/31/2013  Clinical Social Worker:  Freeman Caldron  Date/Time:  04/01/2013 10:13 AM  Referred by:  Physician  Date Referred:  04/01/2013 Referred for  Other - See comment   Other Referral:   From [place] type:  Other - See comment Other interview type:   Administrator at [facility] Admitted from facility:  OTHER Level of care:  Group Home Primary support name:  Teoman Giraud [phone]); Marta Antu [phone]) Primary support relationship to patient:  SIBLING ; GROUP HOME ADMINISTRATOR  Degree of support available:   Good--pt has lived at [facility] since Feb 2013 and his brother is guardian.    CURRENT CONCERNS Current Concerns  Post-Acute Placement   Other Concerns:    SOCIAL WORK ASSESSMENT / PLAN CSW consulted for "nursing home placement - from group home." Unsure if the consult is for pt to return to group home, or if medical team believes pt would benefit from short-term SNF before returning to group home. CSW called administrator at group home and asked how long pt has lived at [facility] has been there since Feb 2013. Facility states his brother, Izell Mercer, is guardian. CSW called Izell Stanley and left voicemails on home and mobile phones requesting a call back and explaining the facility is requesting clinicals to get a better idea of pt's disposition while he has been in the hospital. [facility] holding off on this until CSW speaks with Izell Drew to get his permission to send this. CSW also explained in voicemail that it is unclear if pt will be recommended for SNF, as he is currently on bed rest and PT/OT might work with him while he is here and CSW will update Izell [place] concerning disposition once CSW knows more. CSW invited Izell Kimberly to call back  to discuss pt's case and further explain CSW role in discharge back to facility/to SNF if this is recommended.  Addendum: CSW met in the room with one of the group home owners, Marta Antu [phone]), who explained their group home has concerns about pt returning to their facility, believing he needs a higher level of care like a skilled nursing facility. CSW explained pt is currently on bed rest but that PT/OT will be ordered when this is lifted and PT/OT will need to make the recommendation for pt to go to SNF. CSW asked if Gerilyn Nestle has spoken with pt's brother, who is guardian for pt, and Gerilyn Nestle states she has not had a formal conversation about pt's level of care needs yet but plans to. CSW explained CSW has left voicemails for pt's brother to call CSW back to discuss disposition and explained in voicemails to brother pt might need SNF and we are waiting for PT/OT eval so brother is aware. CSW put note on pt's electronic chart requesting MD order PT/OT when bed rest order is lifted. CSW to update Krissy and pt's brother when PT/OT see pt.    Assessment/plan status:  Psychosocial Support/Ongoing Assessment of Needs Other assessment/ plan:   Information/referral to community resources:   ?SNF; From [place]    PATIENT'S/FAMILY'S RESPONSE TO PLAN OF CARE: N/A       Ky Barban, MSW, [facility] Clinical Social Worker [phone]

## 2013-04-02 LAB — CBC
HCT: 35.6 % — ABNORMAL LOW (ref 39.0–52.0)
Hemoglobin: 11.7 g/dL — ABNORMAL LOW (ref 13.0–17.0)
MCH: 33.3 pg (ref 26.0–34.0)
MCHC: 32.9 g/dL (ref 30.0–36.0)
MCV: 101.4 fL — AB (ref 78.0–100.0)
PLATELETS: 125 10*3/uL — AB (ref 150–400)
RBC: 3.51 MIL/uL — AB (ref 4.22–5.81)
RDW: 13.4 % (ref 11.5–15.5)
WBC: 12 10*3/uL — ABNORMAL HIGH (ref 4.0–10.5)

## 2013-04-02 LAB — BASIC METABOLIC PANEL
BUN: 26 mg/dL — ABNORMAL HIGH (ref 6–23)
CALCIUM: 8.4 mg/dL (ref 8.4–10.5)
CO2: 26 meq/L (ref 19–32)
CREATININE: 0.8 mg/dL (ref 0.50–1.35)
Chloride: 112 mEq/L (ref 96–112)
GFR calc Af Amer: >90 mL/min (ref >90)
GFR, EST NON AFRICAN AMERICAN: 88 mL/min — AB (ref >90)
Glucose, Bld: 84 mg/dL (ref 70–99)
Potassium: 3.8 mEq/L (ref 3.7–5.3)
Sodium: 149 mEq/L — ABNORMAL HIGH (ref 137–147)

## 2013-04-02 MED ORDER — PRO-STAT SUGAR FREE PO LIQD
30.0000 mL | Freq: Three times a day (TID) | ORAL | Status: DC
  Administered 2013-04-02 – 2013-04-10 (×9): 30 mL via ORAL
  Filled 2013-04-02 (×30): qty 30

## 2013-04-02 MED ORDER — SODIUM CHLORIDE 0.9 % IV SOLN
INTRAVENOUS | Status: DC
  Administered 2013-04-02: 13:00:00 via INTRAVENOUS

## 2013-04-02 MED ORDER — TRAZODONE HCL 50 MG PO TABS
50.0000 mg | ORAL_TABLET | Freq: Every day | ORAL | Status: DC
  Administered 2013-04-02 – 2013-04-10 (×8): 50 mg via ORAL
  Filled 2013-04-02 (×10): qty 1

## 2013-04-02 MED ORDER — DEXTROSE 5 % IV SOLN
1.0000 g | Freq: Two times a day (BID) | INTRAVENOUS | Status: DC
  Administered 2013-04-02 – 2013-04-04 (×4): 1 g via INTRAVENOUS
  Filled 2013-04-02 (×7): qty 1

## 2013-04-02 NOTE — Progress Notes (Signed)
Patient Demographics  Wesley Sullivan, is a 73 y.o. male, DOB - [birth year], GUY:[id number]  Admit date - 03/31/2013   Admitting Physician Marinda Elk, MD  Outpatient Primary MD for the patient is [name], MD  LOS - 2   Chief Complaint  Patient presents with  . Weakness        Assessment & Plan    Brief HPI:   Wesley Sullivan is a 73 y.o. Male, has past medical history of mental retardation, dementia, seizure disorders and hypertension that comes in for confusion that started 3 days prior to admission to from the hospital and as per caregiver as patient cannot provide any history . He was found to be dehydrated, hypernatremic, febrile with leukocytosis, . He was admitted to medical service and is being managed for health care associated pneumonia and acute encephalopathy. His influenza PCR is negative. His urine for streptococcal antigen is positive.      Assessment/Plan:    Sepsis/ Leukocytosis/ Toxic encephalopathy: Secondary to HCAP, Continue aspiration precautions, seen by speech, continue broad-spectrum antibiotics, leukocytosis is improving, clinical improvement and sepsis due to HCAP      Chronic Thrombocytopenia: Currently stable continue to monitor.   Lab Results  Component Value Date   PLT 125* 04/02/2013        MENTAL RETARDATION with acute toxic/metabolic encephalopathy. Due to combination of pneumonia and hyponatremia from dehydration. Continue providing supportive care.      HTN (hypertension)  controlled.      Hypernatremia - due to dehydration improved with IV fluids which will continue gently,     Severe protein caloric malnutrion: Seen by speech, placed on diet, will add protein supplementation.      Code Status:    Family  Communication:    Disposition Plan: SNF   Procedures  CT head, chest x-ray   Consults     Medications  Scheduled Meds: . ceFEPime (MAXIPIME) IV  2 g Intravenous Q24H  . heparin  5,000 Units Subcutaneous 3 times per day  . phenytoin (DILANTIN) IV  100 mg Intravenous BID  . sodium chloride  3 mL Intravenous Q12H  . valproate sodium  500 mg Intravenous Q12H  . vancomycin  1,000 mg Intravenous Q24H   Continuous Infusions: . dextrose 5 % and 0.45% NaCl 100 mL/hr (04/01/13 1226)   PRN Meds:.acetaminophen, acetaminophen, haloperidol lactate, LORazepam, ondansetron (ZOFRAN) IV, ondansetron  DVT Prophylaxis   Heparin   Lab Results  Component Value Date   PLT 125* 04/02/2013    Antibiotics     Anti-infectives   Start     Dose/Rate Route Frequency Ordered Stop   04/01/13 1000  oseltamivir (TAMIFLU) capsule 30 mg  Status:  Discontinued     30 mg Oral 2 times daily 04/01/13 0955 04/01/13 1021   03/31/13 2200  ceFEPIme (MAXIPIME) 1 g in dextrose 5 % 50 mL IVPB  Status:  Discontinued     1 g 100 mL/hr over 30 Minutes Intravenous 2 times daily 03/31/13 1941 03/31/13 2007   03/31/13 2200  oseltamivir (TAMIFLU) capsule 75 mg  Status:  Discontinued     75 mg Oral 2 times daily 03/31/13 1941 04/01/13 0955   03/31/13 2100  vancomycin (VANCOCIN) IVPB 1000 mg/200 mL  premix     1,000 mg 200 mL/hr over 60 Minutes Intravenous Every 24 hours 03/31/13 1956 04/08/13 2059   03/31/13 2100  ceFEPIme (MAXIPIME) 2 g in dextrose 5 % 50 mL IVPB     2 g 100 mL/hr over 30 Minutes Intravenous Every 24 hours 03/31/13 2007     03/31/13 1700  vancomycin (VANCOCIN) IVPB 1000 mg/200 mL premix     1,000 mg 200 mL/hr over 60 Minutes Intravenous  Once 03/31/13 1617 04/02/13 0700   03/31/13 1630  piperacillin-tazobactam (ZOSYN) IVPB 3.375 g     3.375 g 100 mL/hr over 30 Minutes Intravenous  Once 03/31/13 1617 03/31/13 1743          Subjective:   Wesley Sullivan today is lying in bed and able to answer  questions, does not appear to be in distress  Objective:   Filed Vitals:   04/01/13 2103 04/02/13 0100 04/02/13 0420 04/02/13 0834  BP: 108/85  126/85 144/105  Pulse: 73  64 70  Temp: 97.3 F (36.3 C) 98.4 F (36.9 C) 98.4 F (36.9 C) 97.5 F (36.4 C)  TempSrc: Axillary Axillary Axillary Axillary  Resp: 20  23 19   Height:      Weight:   49 kg (108 lb 0.4 oz)   SpO2: 99%  100% 100%    Wt Readings from Last 3 Encounters:  04/02/13 49 kg (108 lb 0.4 oz)  03/20/13 51.256 kg (113 lb)  02/03/13 57.153 kg (126 lb)     Intake/Output Summary (Last 24 hours) at 04/02/13 1148 Last data filed at 04/02/13 1100  Gross per 24 hour  Intake   2475 ml  Output      0 ml  Net   2475 ml     Physical Exam  Lying in bed, unable to answer questions, [place].AT,PERRAL Supple Neck,No JVD, No cervical lymphadenopathy appriciated.  Symmetrical Chest wall movement, Good air movement bilaterally, CTAB RRR,No Gallops,Rubs or new Murmurs, No Parasternal Heave +ve B.Sounds, Abd Soft, Non tender, No organomegaly appriciated, No rebound - guarding or rigidity. No Cyanosis, Clubbing or edema, multiple small old wounds on both legs   Data Review   Micro Results Recent Results (from the past 240 hour(s))  CULTURE, BLOOD (ROUTINE X 2)     Status: None   Collection Time    03/31/13  2:40 PM      Result Value Ref Range Status   Specimen Description BLOOD RIGHT FOREARM   Final   Special Requests BOTTLES DRAWN AEROBIC AND ANAEROBIC 5CC   Final   Culture  Setup Time     Final   Value: 03/31/2013 21:13     [facility]formed at [facility]   Culture     Final   Value:        BLOOD CULTURE RECEIVED NO GROWTH TO DATE CULTURE WILL BE HELD FOR 5 DAYS BEFORE ISSUING A FINAL NEGATIVE REPORT     Performed at [facility]   Report Status PENDING   Incomplete  URINE CULTURE     Status: None   Collection Time    03/31/13  3:11 PM      Result Value Ref Range Status   Specimen Description URINE,  CATHETERIZED   Final   Special Requests NONE   Final   Culture  Setup Time     Final   Value: 03/31/2013 20:54     Performed at [facility]   Colony Count     Final  Value: NO GROWTH     Performed at [facility]     Final   Value: NO GROWTH     Performed at [facility]   Report Status 04/01/2013 FINAL   Final  CULTURE, BLOOD (ROUTINE X 2)     Status: None   Collection Time    03/31/13  6:10 PM      Result Value Ref Range Status   Specimen Description BLOOD ARM LEFT   Final   Special Requests BOTTLES DRAWN AEROBIC AND ANAEROBIC B 10CC R 5CC   Final   Culture  Setup Time     Final   Value: 03/31/2013 21:13     Performed at [facility]   Culture     Final   Value:        BLOOD CULTURE RECEIVED NO GROWTH TO DATE CULTURE WILL BE HELD FOR 5 DAYS BEFORE ISSUING A FINAL NEGATIVE REPORT     Performed at [facility]   Report Status PENDING   Incomplete  MRSA PCR SCREENING     Status: None   Collection Time    03/31/13  7:17 PM      Result Value Ref Range Status   MRSA by PCR NEGATIVE  NEGATIVE Final   Comment:            The GeneXpert MRSA Assay (FDA     approved for NASAL specimens     only), is one component of a     comprehensive MRSA colonization     surveillance program. It is not     intended to diagnose MRSA     infection nor to guide or     monitor treatment for     MRSA infections.    Radiology Reports Ct Head Wo Contrast  03/13/2013   CLINICAL DATA:  Fall.  Altered mental status.  EXAM: CT HEAD WITHOUT CONTRAST  TECHNIQUE: Contiguous axial images were obtained from the base of the skull through the vertex without intravenous contrast.  COMPARISON:  Head CT scan 08/16/2012.  FINDINGS: This cortical atrophy and chronic microvascular ischemic change. 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 No evidence of acute intracranial abnormality including infarction, hemorrhage, mass lesion, mass effect, midline shift or  abnormal extra-axial fluid collection. There is partial visualization of a mucous retention cyst or polyp in the right maxillary sinus.  IMPRESSION: No acute finding.  Stable compared to prior exam.   Electronically Signed   By: Drusilla Kanner M.D.   On: 03/13/2013 17:09   Portable Chest 1 View  04/01/2013   CLINICAL DATA:  Cough and congestion  EXAM: PORTABLE CHEST - 1 VIEW  COMPARISON:  DG CHEST 1V PORT dated 03/31/2013  FINDINGS: The heart size and mediastinal contours are within normal limits. The visualized skeletal structures are unremarkable. Cardiac leads obscure detail. Mild central vascular fullness persists without overt edema. Probable crowding of the bronchovascular markings over the right lower lobe allowing for hypoaeration. Lungs are otherwise clear.  IMPRESSION: Probable crowding of bronchovascular markings over the right lower lobe although early pneumonia or atelectasis could appear similar. If symptoms persist, consider PA and lateral chest radiographs obtained at full inspiration when the patient is clinically able.   Electronically Signed   By: Christiana Pellant M.D.   On: 04/01/2013 07:41   Dg Chest Portable 1 View  03/31/2013   CLINICAL DATA:  Unresponsive.  EXAM: PORTABLE CHEST - 1 VIEW  COMPARISON:  08/16/2012  FINDINGS: Rotation to left. Distortion of the mediastinal and cardiac silhouette.  Mild central pulmonary vascular prominence greater on the right.  Elevated right hemidiaphragm limiting evaluation of the right lung base.  No segmental consolidation.  Gas-filled bowel.  Left shoulder joint degenerative changes.  IMPRESSION: Rotated portable exam with mild central pulmonary vascular prominence slightly greater on the right.   Electronically Signed   By: Bridgett Larsson M.D.   On: 03/31/2013 13:58    CBC  Recent Labs Lab 03/31/13 1215 04/01/13 0223 04/02/13 0240  WBC 22.3* 19.8* 12.0*  HGB 13.1 10.9* 11.7*  HCT 39.8 33.0* 35.6*  PLT 120* 123* 125*  MCV 101.8* 101.9*  101.4*  MCH 33.5 33.6 33.3  MCHC 32.9 33.0 32.9  RDW 13.6 13.8 13.4  LYMPHSABS 1.1  --   --   MONOABS 1.6*  --   --   EOSABS 0.0  --   --   BASOSABS 0.0  --   --     Chemistries   Recent Labs Lab 03/31/13 1215 04/01/13 0223 04/02/13 0240  NA 153* 151* 149*  K 5.0 4.1 3.8  CL 110 115* 112  CO2 27 26 26   GLUCOSE 130* 125* 84  BUN 47* 35* 26*  CREATININE 1.09 0.94 0.80  CALCIUM 9.3 8.2* 8.4  AST 89* 68*  --   ALT 43 34  --   ALKPHOS 99 131*  --   BILITOT 0.9 0.7  --    ------------------------------------------------------------------------------------------------------------------ estimated creatinine clearance is 58.7 ml/min (by C-G formula based on Cr of 0.8). ------------------------------------------------------------------------------------------------------------------ No results found for this basename: HGBA1C,  in the last 72 hours ------------------------------------------------------------------------------------------------------------------ No results found for this basename: CHOL, HDL, LDLCALC, TRIG, CHOLHDL, LDLDIRECT,  in the last 72 hours ------------------------------------------------------------------------------------------------------------------ No results found for this basename: TSH, T4TOTAL, FREET3, T3FREE, THYROIDAB,  in the last 72 hours ------------------------------------------------------------------------------------------------------------------ No results found for this basename: VITAMINB12, FOLATE, FERRITIN, TIBC, IRON, RETICCTPCT,  in the last 72 hours  Coagulation profile No results found for this basename: INR, PROTIME,  in the last 168 hours  No results found for this basename: DDIMER,  in the last 72 hours  Cardiac Enzymes No results found for this basename: CK, CKMB, TROPONINI, MYOGLOBIN,  in the last 168 hours ------------------------------------------------------------------------------------------------------------------ No  components found with this basename: POCBNP,      Time Spent in minutes   35   Shakesha Soltau K M.D on 04/02/2013 at 11:48 AM  Between 7am to 7pm - Pager - [phone]  After 7pm go to [email or web address] - password [email or web address]  And look for the night coverage person covering for me after hours  [facility] Office  [phone]

## 2013-04-02 NOTE — Progress Notes (Addendum)
CSW received call from pt's administrator Wesley Sullivan, and CSW explained CSW has submitted for PASARR for SNF authorization from the state and that pt is still on bed rest. CSW explained CSW is following case and PT/OT will work with pt to make recommendation for level of care needs when bed rest order is lifted. Wesley Sullivan states she spoke with pt's brother yesterday encouraging him to call CSW on his break at work to talk about pt's disposition and possible level of care needs when ready for discharge. Wesley Sullivan is going to come to the hospital today to check on pt, and CSW will touch base with Wesley Sullivan if she sees her in the hospital.  Addendum: CSW received call from [name]. [name] was at work and was returning call from CSW from yesterday. CSW explained pt is still on bed rest and that after PT/OT evaluate pt, they will likely recommend SNF and CSW will explain the process for referrals and will work with [name] once PT/OT make their recommendation for SNF. [name] explained CSW can reach him at his work phone [phone] when there is an update/when PT/OT make SNF recommendation and it is time to pursue this placement. CSW has submitted for PASARR for pt, as state will likely need to come evaluate pt while he is in the hospital before giving PASARR number for SNF.  Wesley Sullivan, MSW, [facility] Clinical Social Worker [phone]

## 2013-04-02 NOTE — Progress Notes (Signed)
Speech Language Pathology Treatment: Dysphagia  Patient Details Name: Wesley Sullivan MRN: [id number] DOB: 12-11-1940 Today's Date: 04/02/2013 Time: [id number] SLP Time Calculation (min): 21 min  Assessment / Plan / Recommendation Clinical Impression  Pt demonstrates improved automaticity with PO trials; writhing movement has ceased. Pt continues to demonstrate evidence of a moderate to severe dysphagia with sucking movement for oral transit, delayed swallow multiple swallows and suspected oropharyngeal residue. Pt has coughing episode about every 3rd or 4th bite with puree and honey thick liquids. Likely this is pts baseline given report of large cervical osteophyte and severe dysphagia. His arousal is acutely worsened however leading to decreased responsiveness after a few minutes. Pt is on a regular diet and thin liquids pta, but will restrict pts diet at this time until mentation improves. Even with this diet aspiration risk is moderate. SLP will continue to follow.    HPI HPI: Wesley Sullivan is a 73 y.o. male with past medical history of mental retardation, dementia, seizure disorders and hypertension that comes in for confusion that started 3 days prior to admission to from the hospital and as per caregiver as patient cannot provide any history has been increasingly weak over the past several days he's had some increase in cough over that same time, he has not had any abdominal pain , fever chest pain diarrhea fever headache loss of consciousness nausea vomiting rash shortness of breath.  MD suspects CAP, but unsure of source of infection. An MBS was perfomed in 2010, full report not available. MD note states,  'He was found to have silent aspiration with oropharyngeal dysphagia. During the MBS swallow, he was found to have a large cervical osteophyte with pressure effect on the oropharynx and nasopharynx. 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 This finding was discussed with his family and he was agreed that he was not a candidate for any surgical procedure, and therefore, will be slowly advanced on his diet on pureed diet recommended by the speech therapist." Unsure of liquid texture recommendation.    Pertinent Vitals NA  SLP Plan  Continue with current plan of care    Recommendations Diet recommendations: Honey-thick liquid;Dysphagia 1 (puree) Liquids provided via: Teaspoon Medication Administration: Crushed with puree Supervision: Staff to assist with self feeding Compensations: Slow rate;Small sips/bites;Multiple dry swallows after each bite/sip Postural Changes and/or Swallow Maneuvers: Seated upright 90 degrees;Upright 30-60 min after meal              Oral Care Recommendations: Oral care BID Follow up Recommendations: 24 hour supervision/assistance Plan: Continue with current plan of care    GO    [facility] CenterBonnie Ariyon Gerstenberger, MA CCC-SLP [phone]  Claudine MoutonDeBlois, Mccall Lomax Caroline 04/02/2013, 9:29 AM

## 2013-04-02 NOTE — Progress Notes (Signed)
ANTIBIOTIC CONSULT NOTE - FOLLOW UP  Pharmacy Consult for Vancomycin and Cefepime Indication: pneumonia  Patient Weight: 49 kg   Labs:  Recent Labs  03/31/13 1215 04/01/13 0223 04/02/13 0240  WBC 22.3* 19.8* 12.0*  HGB 13.1 10.9* 11.7*  PLT 120* 123* 125*  CREATININE 1.09 0.94 0.80   Estimated Creatinine Clearance: 58.7 ml/min (by C-G formula based on Cr of 0.8).  97.5 F (36.4 C) (Axillary) ,  Lab Results  Component Value Date   WBC 12.0* 04/02/2013    Microbiology: Recent Results (from the past 720 hour(s))  CULTURE, BLOOD (ROUTINE X 2)     Status: None   Collection Time    03/31/13  2:40 PM      Result Value Ref Range Status   Specimen Description BLOOD RIGHT FOREARM   Final   Special Requests BOTTLES DRAWN AEROBIC AND ANAEROBIC 5CC   Final   Culture  Setup Time     Final   Value: 03/31/2013 21:13     Performed at [facility]   Culture     Final   Value:        BLOOD CULTURE RECEIVED NO GROWTH TO DATE CULTURE WILL BE HELD FOR 5 DAYS BEFORE ISSUING A FINAL NEGATIVE REPORT     Performed at [facility]   Report Status PENDING   Incomplete  URINE CULTURE     Status: None   Collection Time    03/31/13  3:11 PM      Result Value Ref Range Status   Specimen Description URINE, CATHETERIZED   Final   Special Requests NONE   Final   Culture  Setup Time     Final   Value: 03/31/2013 20:54     Performed at [facility] Count     Final   Value: NO GROWTH     Performed at [facility]   Culture     Final   Value: NO GROWTH     Performed at [facility]   Report Status 04/01/2013 FINAL   Final  CULTURE, BLOOD (ROUTINE X 2)     Status: None   Collection Time    03/31/13  6:10 PM      Result Value Ref Range Status   Specimen Description BLOOD ARM LEFT   Final   Special Requests BOTTLES DRAWN AEROBIC AND ANAEROBIC B 10CC R 5CC   Final   Culture  Setup Time     Final   Value: 03/31/2013 21:13     Performed at [facility]   Culture     Final   Value:        BLOOD CULTURE RECEIVED NO GROWTH TO DATE CULTURE WILL BE HELD FOR 5 DAYS BEFORE ISSUING A FINAL NEGATIVE REPORT     Performed at [facility]   Report Status PENDING   Incomplete  MRSA PCR SCREENING     Status: None   Collection Time    03/31/13  7:17 PM      Result Value Ref Range Status   MRSA by PCR NEGATIVE  NEGATIVE Final   Comment:            The GeneXpert MRSA Assay (FDA     approved for NASAL specimens     only), is one component of a     comprehensive MRSA colonization     surveillance program. It is not     intended to diagnose  MRSA     infection nor to guide or     monitor treatment for     MRSA infections.    Anti-infectives   Start     Dose/Rate Route Frequency Ordered Stop   04/01/13 1000  oseltamivir (TAMIFLU) capsule 30 mg  Status:  Discontinued     30 mg Oral 2 times daily 04/01/13 0955 04/01/13 1021   03/31/13 2200  ceFEPIme (MAXIPIME) 1 g in dextrose 5 % 50 mL IVPB  Status:  Discontinued     1 g 100 mL/hr over 30 Minutes Intravenous 2 times daily 03/31/13 1941 03/31/13 2007   03/31/13 2200  oseltamivir (TAMIFLU) capsule 75 mg  Status:  Discontinued     75 mg Oral 2 times daily 03/31/13 1941 04/01/13 0955   03/31/13 2100  vancomycin (VANCOCIN) IVPB 1000 mg/200 mL premix     1,000 mg 200 mL/hr over 60 Minutes Intravenous Every 24 hours 03/31/13 1956 04/08/13 2059   03/31/13 2100  ceFEPIme (MAXIPIME) 2 g in dextrose 5 % 50 mL IVPB     2 g 100 mL/hr over 30 Minutes Intravenous Every 24 hours 03/31/13 2007     03/31/13 1700  vancomycin (VANCOCIN) IVPB 1000 mg/200 mL premix     1,000 mg 200 mL/hr over 60 Minutes Intravenous  Once 03/31/13 1617 04/02/13 0700   03/31/13 1630  piperacillin-tazobactam (ZOSYN) IVPB 3.375 g     3.375 g 100 mL/hr over 30 Minutes Intravenous  Once 03/31/13 1617 03/31/13 1743      Assessment:  Day # 3/8 Vancomycin and Cefepime for HCAP.  Noted that urinary strep antigen is  positive.  Renal function has improved with CrCl up to 58.7 ml/min from < 50 ml/min.  Patient is afebrile and WBC's have improved.  Goal of Therapy:  Vancomycin trough level 15-20 mcg/ml  Plan:  1. Continue Vancomycin as ordered pending antibiotic de-escalation.  Will defer Vancomycin levels for now. 2. Change Cefepime to 1 gm IV q 12 hours. 3. Consider de-escalation of antibiotics to Rocephin or Levaquin.  Emelynn Rance, Elisha HeadlandEarle J , Pharm. D.  04/02/2013,11:57 AM

## 2013-04-03 ENCOUNTER — Inpatient Hospital Stay (HOSPITAL_COMMUNITY): Payer: Medicare Other

## 2013-04-03 MED ORDER — DEXTROSE 5 % IV SOLN
INTRAVENOUS | Status: DC
  Administered 2013-04-03 – 2013-04-04 (×2): via INTRAVENOUS

## 2013-04-03 NOTE — Progress Notes (Signed)
CSW called pt's RN and asked if bed rest order has been lifted for pt. RN states they can lift this order. CSW explained MD recommending SNF, and PT/OT have to evaluate him for this level of care, as SNFs are requesting PT/OT eval. RN will put orders in for PT/OT eval and treat. RN states pt is transferring to 5W, and CSW will provide new CSW a handoff.   Maryclare LabradorJulie Abdulkarim Eberlin, MSW, [facility] LPCSWA Clinical Social Worker [phone]

## 2013-04-03 NOTE — Progress Notes (Addendum)
CSW noticed recommendation in MD note for SNF and called pt's brother and left a voicemail for brother explaining that pt is formally recommended for SNF and CSW wanted to follow up with brother about placement. CSW explained in voicemail that [place] and will follow up with brother Wesley Sullivan when facilities respond to placement request. CSW also explained if brother has any specific SNFs in mind, CSW is happy to send information to these facilities as well. CSW noticed order for pt to be transferred to med surg floor as well, and informed pt's brother of move in voicemail. CSW waiting for return call from brother.  Addendum: CSW called the main number for [employer] to check on the status of pt's PASARR request, and [employer] rep explained the reviewers are actually not employees of [employer] be are contractors with a third-party agency. CSW asked how CSW can reach the reviewer, Wesley Sullivan, and [employer] explained she should call CSW within 48 hours of the PASARR request. [employer] rep explained if CSW does not hear from Wesley Sullivan by tomorrow afternoon, CSW can call [employer] back and we can pursue PASARR information at this time.  Wesley Sullivan, MSW, [facility] Clinical Social Worker [phone]

## 2013-04-03 NOTE — Progress Notes (Signed)
Wesley HeightRobert L Sullivan is a 73 y.o. male patient who transferred  from [facility] awake, alert  & orientated to self, DNR, VSS - Blood pressure 123/84, pulse 74, temperature 97.3 F (36.3 C), temperature source Oral, resp. rate 20, Sullivan 5\' 6"  (1.676 m), weight 50.9 kg (112 lb 3.4 oz), SpO2 98.00%., , no c/o shortness of breath, no c/o chest pain, no distress noted. Tele # [id number] placed and pt is currently running:normal sinus rhythm.   IV site WDL: forearm right, condition patent and no redness with a transparent dsg that's clean dry and intact.  Allergies:  No Known Allergies   Past Medical History  Diagnosis Date  . Cerebral palsy   . Mental retardation   . Diabetes mellitus   . Dementia   . Seizures   . Gastroesophageal reflux disease   . Acute renal failure   . Constipation   . Speech impairment   . Hypertension   . Anemia     Pt welcomed to room  No evidence of skin break down noted on exam.     Will cont to monitor and assist as needed.  Cindra EvesCelano, Hoy Fallert Czarnecki, RN 04/03/2013 8:05 PM

## 2013-04-03 NOTE — Progress Notes (Signed)
Patient Demographics  Wesley Sullivan, is a 73 y.o. male, DOB - [birth year], RUE:[id number]  Admit date - 03/31/2013   Admitting Physician Marinda Elk, MD  Outpatient Primary MD for the patient is [name], MD  LOS - 3   Chief Complaint  Patient presents with  . Weakness        Assessment & Plan    Brief HPI:   Wesley Sullivan is a 73 y.o. Male, has past medical history of mental retardation, dementia, seizure disorders and hypertension that comes in for confusion that started 3 days prior to admission to from the hospital and as per caregiver as patient cannot provide any history . He was found to be dehydrated, hypernatremic, febrile with leukocytosis, . He was admitted to medical service and is being managed for health care associated pneumonia and acute encephalopathy. His influenza PCR is negative. His urine for streptococcal antigen is positive.      Assessment/Plan:    Sepsis/ Leukocytosis/ Toxic encephalopathy: Secondary to HCAP, Continue aspiration precautions, seen by speech, continue broad-spectrum antibiotics, leukocytosis is improving, clinical improvement and sepsis due to HCAP. Likely we'll switch to oral antibiotics tomorrow and discharge. Transfer out of step down.      Chronic Thrombocytopenia: Currently stable continue to monitor.   Lab Results  Component Value Date   PLT 125* 04/02/2013        MENTAL RETARDATION with acute toxic/metabolic encephalopathy. Due to combination of pneumonia and hyponatremia from dehydration. Continue providing supportive care.      HTN (hypertension)  controlled.      Hypernatremia - due to dehydration improved with IV fluids which will continue gently. Recheck BMP this am.     Severe protein caloric  malnutrion: Seen by speech, placed on diet, will add protein supplementation.      Code Status:    Family Communication:    Disposition Plan: SNF   Procedures  CT head, chest x-ray   Consults     Medications  Scheduled Meds: . ceFEPime (MAXIPIME) IV  1 g Intravenous Q12H  . feeding supplement (PRO-STAT SUGAR FREE 64)  30 mL Oral TID WC  . heparin  5,000 Units Subcutaneous 3 times per day  . phenytoin (DILANTIN) IV  100 mg Intravenous BID  . sodium chloride  3 mL Intravenous Q12H  . traZODone  50 mg Oral QHS  . valproate sodium  500 mg Intravenous Q12H  . vancomycin  1,000 mg Intravenous Q24H   Continuous Infusions: . dextrose     PRN Meds:.acetaminophen, haloperidol lactate, ondansetron (ZOFRAN) IV  DVT Prophylaxis   Heparin   Lab Results  Component Value Date   PLT 125* 04/02/2013    Antibiotics     Anti-infectives   Start     Dose/Rate Route Frequency Ordered Stop   04/02/13 1230  ceFEPIme (MAXIPIME) 1 g in dextrose 5 % 50 mL IVPB     1 g 100 mL/hr over 30 Minutes Intravenous Every 12 hours 04/02/13 1211 04/07/13 1229   04/01/13 1000  oseltamivir (TAMIFLU) capsule 30 mg  Status:  Discontinued     30 mg Oral 2 times daily 04/01/13 0955 04/01/13 1021   03/31/13 2200  ceFEPIme (MAXIPIME) 1 g in dextrose 5 %  50 mL IVPB  Status:  Discontinued     1 g 100 mL/hr over 30 Minutes Intravenous 2 times daily 03/31/13 1941 03/31/13 2007   03/31/13 2200  oseltamivir (TAMIFLU) capsule 75 mg  Status:  Discontinued     75 mg Oral 2 times daily 03/31/13 1941 04/01/13 0955   03/31/13 2100  vancomycin (VANCOCIN) IVPB 1000 mg/200 mL premix     1,000 mg 200 mL/hr over 60 Minutes Intravenous Every 24 hours 03/31/13 1956 04/08/13 2059   03/31/13 2100  ceFEPIme (MAXIPIME) 2 g in dextrose 5 % 50 mL IVPB  Status:  Discontinued     2 g 100 mL/hr over 30 Minutes Intravenous Every 24 hours 03/31/13 2007 04/02/13 1209   03/31/13 1700  vancomycin (VANCOCIN) IVPB 1000 mg/200 mL premix      1,000 mg 200 mL/hr over 60 Minutes Intravenous  Once 03/31/13 1617 04/02/13 0700   03/31/13 1630  piperacillin-tazobactam (ZOSYN) IVPB 3.375 g     3.375 g 100 mL/hr over 30 Minutes Intravenous  Once 03/31/13 1617 03/31/13 1743          Subjective:   Wesley Sullivan today is lying in bed and able to answer questions, does not appear to be in distress  Objective:   Filed Vitals:   04/03/13 0242 04/03/13 0428 04/03/13 0915 04/03/13 1143  BP:  132/76 136/124 109/81  Pulse:  50 74 67  Temp:  97.6 F (36.4 C) 97.8 F (36.6 C) 97.5 F (36.4 C)  TempSrc:  Axillary Axillary Axillary  Resp:   22 20  Height:      Weight: 50.9 kg (112 lb 3.4 oz) 50.9 kg (112 lb 3.4 oz)    SpO2:  96% 100% 100%    Wt Readings from Last 3 Encounters:  04/03/13 50.9 kg (112 lb 3.4 oz)  03/20/13 51.256 kg (113 lb)  02/03/13 57.153 kg (126 lb)     Intake/Output Summary (Last 24 hours) at 04/03/13 1225 Last data filed at 04/03/13 0430  Gross per 24 hour  Intake      0 ml  Output   1000 ml  Net  -1000 ml     Physical Exam  Lying in bed, unable to answer questions, [place].AT,PERRAL Supple Neck,No JVD, No cervical lymphadenopathy appriciated.  Symmetrical Chest wall movement, Good air movement bilaterally, CTAB RRR,No Gallops,Rubs or new Murmurs, No Parasternal Heave +ve B.Sounds, Abd Soft, Non tender, No organomegaly appriciated, No rebound - guarding or rigidity. No Cyanosis, Clubbing or edema, multiple small old wounds on both legs   Data Review   Micro Results Recent Results (from the past 240 hour(s))  CULTURE, BLOOD (ROUTINE X 2)     Status: None   Collection Time    03/31/13  2:40 PM      Result Value Ref Range Status   Specimen Description BLOOD RIGHT FOREARM   Final   Special Requests BOTTLES DRAWN AEROBIC AND ANAEROBIC 5CC   Final   Culture  Setup Time     Final   Value: 03/31/2013 21:13     Performed at [facility]   Culture     Final   Value:        BLOOD  CULTURE RECEIVED NO GROWTH TO DATE CULTURE WILL BE HELD FOR 5 DAYS BEFORE ISSUING A FINAL NEGATIVE REPORT     Performed at [facility]   Report Status PENDING   Incomplete  URINE CULTURE     Status: None   Collection  Time    03/31/13  3:11 PM      Result Value Ref Range Status   Specimen Description URINE, CATHETERIZED   Final   Special Requests NONE   Final   Culture  Setup Time     Final   Value: 03/31/2013 20:54     Performed at [facility]   Colony Count     Final   Value: NO GROWTH     Performed at [facility]   Culture     Final   Value: NO GROWTH     Performed at [facility]   Report Status 04/01/2013 FINAL   Final  CULTURE, BLOOD (ROUTINE X 2)     Status: None   Collection Time    03/31/13  6:10 PM      Result Value Ref Range Status   Specimen Description BLOOD ARM LEFT   Final   Special Requests BOTTLES DRAWN AEROBIC AND ANAEROBIC B 10CC R 5CC   Final   Culture  Setup Time     Final   Value: 03/31/2013 21:13     Performed at [facility]   Culture     Final   Value:        BLOOD CULTURE RECEIVED NO GROWTH TO DATE CULTURE WILL BE HELD FOR 5 DAYS BEFORE ISSUING A FINAL NEGATIVE REPORT     Performed at [facility]   Report Status PENDING   Incomplete  MRSA PCR SCREENING     Status: None   Collection Time    03/31/13  7:17 PM      Result Value Ref Range Status   MRSA by PCR NEGATIVE  NEGATIVE Final   Comment:            The GeneXpert MRSA Assay (FDA     approved for NASAL specimens     only), is one component of a     comprehensive MRSA colonization     surveillance program. It is not     intended to diagnose MRSA     infection nor to guide or     monitor treatment for     MRSA infections.    Radiology Reports Ct Head Wo Contrast  03/13/2013   CLINICAL DATA:  Fall.  Altered mental status.  EXAM: CT HEAD WITHOUT CONTRAST  TECHNIQUE: Contiguous axial images were obtained from the base of the skull through the  vertex without intravenous contrast.  COMPARISON:  Head CT scan 08/16/2012.  FINDINGS: This cortical atrophy and chronic microvascular ischemic change. 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 No evidence of acute intracranial abnormality including infarction, hemorrhage, mass lesion, mass effect, midline shift or abnormal extra-axial fluid collection. There is partial visualization of a mucous retention cyst or polyp in the right maxillary sinus.  IMPRESSION: No acute finding.  Stable compared to prior exam.   Electronically Signed   By: Drusilla Kanner M.D.   On: 03/13/2013 17:09   Portable Chest 1 View  04/01/2013   CLINICAL DATA:  Cough and congestion  EXAM: PORTABLE CHEST - 1 VIEW  COMPARISON:  DG CHEST 1V PORT dated 03/31/2013  FINDINGS: The heart size and mediastinal contours are within normal limits. The visualized skeletal structures are unremarkable. Cardiac leads obscure detail. Mild central vascular fullness persists without overt edema. Probable crowding of the bronchovascular markings over the right lower lobe allowing for hypoaeration. Lungs are otherwise clear.  IMPRESSION: Probable crowding of bronchovascular markings over the right lower  lobe although early pneumonia or atelectasis could appear similar. If symptoms persist, consider PA and lateral chest radiographs obtained at full inspiration when the patient is clinically able.   Electronically Signed   By: Christiana Pellant M.D.   On: 04/01/2013 07:41   Dg Chest Portable 1 View  03/31/2013   CLINICAL DATA:  Unresponsive.  EXAM: PORTABLE CHEST - 1 VIEW  COMPARISON:  08/16/2012  FINDINGS: Rotation to left. Distortion of the mediastinal and cardiac silhouette.  Mild central pulmonary vascular prominence greater on the right.  Elevated right hemidiaphragm limiting evaluation of the right lung base.  No segmental consolidation.  Gas-filled bowel.  Left shoulder joint degenerative changes.  IMPRESSION: Rotated portable exam with mild central  pulmonary vascular prominence slightly greater on the right.   Electronically Signed   By: Bridgett Larsson M.D.   On: 03/31/2013 13:58    CBC  Recent Labs Lab 03/31/13 1215 04/01/13 0223 04/02/13 0240  WBC 22.3* 19.8* 12.0*  HGB 13.1 10.9* 11.7*  HCT 39.8 33.0* 35.6*  PLT 120* 123* 125*  MCV 101.8* 101.9* 101.4*  MCH 33.5 33.6 33.3  MCHC 32.9 33.0 32.9  RDW 13.6 13.8 13.4  LYMPHSABS 1.1  --   --   MONOABS 1.6*  --   --   EOSABS 0.0  --   --   BASOSABS 0.0  --   --     Chemistries   Recent Labs Lab 03/31/13 1215 04/01/13 0223 04/02/13 0240  NA 153* 151* 149*  K 5.0 4.1 3.8  CL 110 115* 112  CO2 27 26 26   GLUCOSE 130* 125* 84  BUN 47* 35* 26*  CREATININE 1.09 0.94 0.80  CALCIUM 9.3 8.2* 8.4  AST 89* 68*  --   ALT 43 34  --   ALKPHOS 99 131*  --   BILITOT 0.9 0.7  --    ------------------------------------------------------------------------------------------------------------------ estimated creatinine clearance is 61 ml/min (by C-G formula based on Cr of 0.8). ------------------------------------------------------------------------------------------------------------------ No results found for this basename: HGBA1C,  in the last 72 hours ------------------------------------------------------------------------------------------------------------------ No results found for this basename: CHOL, HDL, LDLCALC, TRIG, CHOLHDL, LDLDIRECT,  in the last 72 hours ------------------------------------------------------------------------------------------------------------------ No results found for this basename: TSH, T4TOTAL, FREET3, T3FREE, THYROIDAB,  in the last 72 hours ------------------------------------------------------------------------------------------------------------------ No results found for this basename: VITAMINB12, FOLATE, FERRITIN, TIBC, IRON, RETICCTPCT,  in the last 72 hours  Coagulation profile No results found for this basename: INR, PROTIME,  in the  last 168 hours  No results found for this basename: DDIMER,  in the last 72 hours  Cardiac Enzymes No results found for this basename: CK, CKMB, TROPONINI, MYOGLOBIN,  in the last 168 hours ------------------------------------------------------------------------------------------------------------------ No components found with this basename: POCBNP,      Time Spent in minutes   35   Susa Raring K M.D on 04/03/2013 at 12:25 PM  Between 7am to 7pm - Pager - [phone]  After 7pm go to [email or web address] - password [email or web address]  And look for the night coverage person covering for me after hours  [facility] Office  [phone]

## 2013-04-03 NOTE — Progress Notes (Signed)
Clinical Social Work Department CLINICAL SOCIAL WORK PLACEMENT NOTE 04/03/2013  Patient:  [name],Robt L  Account Number:  [id number] Admit date:  03/31/2013  Clinical Social Worker:  Maryclare LabradorJULIE [place], Theresia MajorsLCSWA  Date/time:  04/03/2013 03:18 PM  Clinical Social Work is seeking post-discharge placement for this patient at the following level of care:   SKILLED NURSING   (*CSW will update this form in Epic as items are completed)   N/A-clinicals sent to all SNFs in [place] to find long-term placement for pt with complex medical history Patient/family provided with [facility] Department of Clinical Social Work's list of facilities offering this level of care within the geographic area requested by the patient (or if unable, by the patient's family).  04/03/2013 in voicemail to pt's brother Patient/family informed of their freedom to choose among providers that offer the needed level of care, that participate in Medicare, Medicaid or managed care program needed by the patient, have an available bed and are willing to accept the patient.  N/A-clinicals not sent to this SNF  Patient/family informed of [facility]' ownership interest in [facility], as well as of the fact that they are under no obligation to receive care at this facility.  PASARR submitted to [employer] on 04/03/2013 PASARR number received from [employer] on 04/03/2013  FL2 transmitted to all facilities in geographic area requested by pt/family on  04/03/2013 FL2 transmitted to all facilities within larger geographic area on   Patient informed that his/her managed care company has contracts with or will negotiate with  certain facilities, including the following:     Patient/family informed of bed offers received:   Patient chooses bed at  Physician recommends and patient chooses bed at    Patient to be transferred to  on   Patient to be transferred to facility by   The following physician request were entered in  Epic:   Additional Comments:   Maryclare LabradorJulie Shian Goodnow, MSW, [facility]WA Clinical Social Worker [phone]

## 2013-04-03 NOTE — Evaluation (Signed)
Physical Therapy Evaluation Patient Details Name: Wesley Sullivan MRN: [id number] DOB: 20-Jan-1941 Today's Date: 04/03/2013 Time: 4098-1191 PT Time Calculation (min): 24 min  PT Assessment / Plan / Recommendation History of Present Illness  73 y.o. male admitted to [facility] on 03/31/13 with past medical history of mental retardation, dementia, seizure disorders and hypertension that comes in for confusion that started 3 days prior to admission to from the hospital and as per caregiver as patient cannot provide any history has been increasingly weak over the past several days he's had some increase in cough.  Dx with PNA and acute encepholopathy.    Clinical Impression  The pt is no longer physically able to handle returning to a group home at his current level of assistance mod two person assist to get to the chair.  He would benefit from short term SNF level rehab to get stronger so he may be able to return to a group home environment.   PT to follow acutely for deficits listed below.     PT Assessment  Patient needs continued PT services    Follow Up Recommendations  SNF    Does the patient have the potential to tolerate intense rehabilitation     NA  Barriers to Discharge Decreased caregiver support Group home is not able to provide the level of physical assist he needs right now.     Equipment Recommendations  None recommended by PT    Recommendations for Other Services   NA  Frequency Min 2X/week    Precautions / Restrictions Precautions Precautions: Fall Precaution Comments: generally weak and unsteady on his feet   Pertinent Vitals/Pain See vitals flow sheet.       Mobility  Bed Mobility Overal bed mobility: Needs Assistance Bed Mobility: Supine to Sit Supine to sit: Mod assist General bed mobility comments: Mod assist to support trunk and initate movement to EOB by bringing legs over the side and starting trunk flexion.  Pt then processed what I wanted and was able to  help a bit, but still needed mod assist to prevent posterior LOB during transitions.  Transfers Overall transfer level: Needs assistance Transfers: Sit to/from Stand;Stand Pivot Transfers Sit to Stand: +2 physical assistance;Mod assist Stand pivot transfers: +2 physical assistance;Mod assist General transfer comment: Pt's brother there helping with attempts at standing and transfer to the chair.  Pt unsteady on his feet and flexed knees and trunk in standing.          PT Diagnosis: Difficulty walking;Abnormality of gait;Generalized weakness  PT Problem List: Decreased strength;Decreased activity tolerance;Decreased balance;Decreased mobility;Decreased coordination;Decreased cognition;Decreased knowledge of use of DME PT Treatment Interventions: DME instruction;Gait training;Functional mobility training;Therapeutic activities;Therapeutic exercise;Balance training;Neuromuscular re-education;Patient/family education     PT Goals(Current goals can be found in the care plan section) Acute Rehab PT Goals Patient Stated Goal: brother would like for him to get stronger and go back to group home PT Goal Formulation: With patient/family Time For Goal Achievement: 04/17/13 Potential to Achieve Goals: Good  Visit Information  Last PT Received On: 04/03/13 Assistance Needed: +2 (for safety with gait) History of Present Illness: 73 y.o. male admitted to [facility] on 03/31/13 with past medical history of mental retardation, dementia, seizure disorders and hypertension that comes in for confusion that started 3 days prior to admission to from the hospital and as per caregiver as patient cannot provide any history has been increasingly weak over the past several days he's had some increase in cough.  Dx with PNA and  acute encepholopathy.         Prior Functioning  Home Living Family/patient expects to be discharged to:: Skilled nursing facility Additional Comments: pt from a group home Prior Function Level  of Independence: Needs assistance Gait / Transfers Assistance Needed: assist needed for gait for a good week PTA due to progressive weakness from staff at the group home.  Communication Communication: Other (comment) (cognitive deficits at baseline, one word answers, sterogenes)    Cognition  Cognition Arousal/Alertness: Awake/alert Behavior During Therapy: Restless Overall Cognitive Status: History of cognitive impairments - at baseline    Extremity/Trunk Assessment Upper Extremity Assessment Upper Extremity Assessment: Generalized weakness Lower Extremity Assessment Lower Extremity Assessment: Generalized weakness Cervical / Trunk Assessment Cervical / Trunk Assessment: Normal   Balance Balance Overall balance assessment: Needs assistance Sitting-balance support: Bilateral upper extremity supported;Feet supported Sitting balance-Leahy Scale: Poor Standing balance support: Bilateral upper extremity supported Standing balance-Leahy Scale: Poor General Comments General comments (skin integrity, edema, etc.): Grinds his teeth and has dysphasia diet.   End of Session PT - End of Session Activity Tolerance: Patient limited by fatigue Patient left: in chair;with call bell/phone within reach;with family/visitor present    Lurena JoinerRebecca B. Mikele Sifuentes, PT, DPT [phone]   04/03/2013, 5:38 PM

## 2013-04-03 NOTE — Progress Notes (Signed)
Speech Language Pathology Treatment: Dysphagia  Patient Details Name: Wesley Sullivan MRN: [id number] DOB: [birth year] Today's Date: 04/03/2013 Time: [id number] SLP Time Calculation (min): 17 min  Assessment / Plan / Recommendation Clinical Impression  Pt seen for f/u dysphagia treatment. Upon arrival, RN had been assisting with lunch meal with small amount of pureed food administered. Oral cavity clear upon initiation of additional PO trials by SLP; RN reports no coughing. Pt consumed small amounts of Dys 1 textures and honey thick liquids with significant oral holding and extraneous oral movements noted; automaticity of swallow appears reduced as compared to previous SLP visit, with large amount of PO trials suctioned out of pt's oral cavity. Pt with delayed cough noted. Pt appears to be fluctuating with oropharyngeal swallow function, and therefore will require CLOSE full supervision to assess for oral clearance after every bite and sip. Recommend to have oral suction available at bedside, and if pt begins to fatigue/orally hold PO, bolus should be suctioned and PO should be held until he is fully able to participate. He will likely require smaller, more frequent meals throughout the day. SLP to continue to follow closely.   HPI HPI: Wesley Sullivan is a 73 y.o. male with past medical history of mental retardation, dementia, seizure disorders and hypertension that comes in for confusion that started 3 days prior to admission to from the hospital and as per caregiver as patient cannot provide any history has been increasingly weak over the past several days he's had some increase in cough over that same time, he has not had any abdominal pain , fever chest pain diarrhea fever headache loss of consciousness nausea vomiting rash shortness of breath.  MD suspects CAP, but unsure of source of infection. An MBS was perfomed in 2010, full report not available. MD note states,  'He was found to have silent  aspiration with oropharyngeal dysphagia. During the MBS swallow, he was found to have a large cervical osteophyte with pressure effect on the oropharynx and nasopharynx. 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 This finding was discussed with his family and he was agreed that he was not a candidate for any surgical procedure, and therefore, will be slowly advanced on his diet on pureed diet recommended by the speech therapist." Unsure of liquid texture recommendation.    Pertinent Vitals N/A  SLP Plan  Continue with current plan of care    Recommendations Diet recommendations: Dysphagia 1 (puree);Honey-thick liquid Liquids provided via: Teaspoon Medication Administration: Crushed with puree Supervision: Staff to assist with self feeding;Full supervision/cueing for compensatory strategies Compensations: Slow rate;Small sips/bites;Multiple dry swallows after each bite/sip Postural Changes and/or Swallow Maneuvers: Seated upright 90 degrees;Upright 30-60 min after meal              Oral Care Recommendations: Oral care BID Follow up Recommendations: 24 hour supervision/assistance Plan: Continue with current plan of care    GO      Maxcine HamLaura Paiewonsky, M.A. CCC-SLP [phone]  Maxcine Hamaiewonsky, Ayari Liwanag 04/03/2013, 3:57 PM

## 2013-04-04 LAB — CBC
HCT: 35.2 % — ABNORMAL LOW (ref 39.0–52.0)
Hemoglobin: 11.8 g/dL — ABNORMAL LOW (ref 13.0–17.0)
MCH: 32.9 pg (ref 26.0–34.0)
MCHC: 33.5 g/dL (ref 30.0–36.0)
MCV: 98.1 fL (ref 78.0–100.0)
PLATELETS: 84 10*3/uL — AB (ref 150–400)
RBC: 3.59 MIL/uL — ABNORMAL LOW (ref 4.22–5.81)
RDW: 12.5 % (ref 11.5–15.5)
WBC: 6.4 10*3/uL (ref 4.0–10.5)

## 2013-04-04 LAB — BASIC METABOLIC PANEL
BUN: 20 mg/dL (ref 6–23)
CO2: 28 mEq/L (ref 19–32)
CREATININE: 0.62 mg/dL (ref 0.50–1.35)
Calcium: 8.4 mg/dL (ref 8.4–10.5)
Chloride: 103 mEq/L (ref 96–112)
GFR calc non Af Amer: >90 mL/min (ref >90)
Glucose, Bld: 93 mg/dL (ref 70–99)
Potassium: 3.4 mEq/L — ABNORMAL LOW (ref 3.7–5.3)
Sodium: 140 mEq/L (ref 137–147)

## 2013-04-04 MED ORDER — DOXYCYCLINE HYCLATE 100 MG PO TABS
100.0000 mg | ORAL_TABLET | Freq: Two times a day (BID) | ORAL | Status: DC
  Filled 2013-04-04 (×2): qty 1

## 2013-04-04 MED ORDER — HALOPERIDOL LACTATE 5 MG/ML IJ SOLN
2.0000 mg | Freq: Four times a day (QID) | INTRAMUSCULAR | Status: DC | PRN
  Administered 2013-04-04 (×3): 2 mg via INTRAMUSCULAR
  Filled 2013-04-04 (×4): qty 1

## 2013-04-04 MED ORDER — POTASSIUM CHLORIDE CRYS ER 20 MEQ PO TBCR
40.0000 meq | EXTENDED_RELEASE_TABLET | Freq: Once | ORAL | Status: AC
  Administered 2013-04-04: 40 meq via ORAL
  Filled 2013-04-04: qty 2

## 2013-04-04 MED ORDER — LORAZEPAM 2 MG/ML IJ SOLN
INTRAMUSCULAR | Status: AC
  Filled 2013-04-04: qty 1

## 2013-04-04 MED ORDER — LORAZEPAM 2 MG/ML IJ SOLN
1.0000 mg | Freq: Once | INTRAMUSCULAR | Status: AC
  Administered 2013-04-04: 1 mg via INTRAMUSCULAR

## 2013-04-04 MED ORDER — LEVOFLOXACIN 750 MG PO TABS
750.0000 mg | ORAL_TABLET | Freq: Every day | ORAL | Status: AC
  Administered 2013-04-04 – 2013-04-08 (×5): 750 mg via ORAL
  Filled 2013-04-04 (×5): qty 1

## 2013-04-04 NOTE — Clinical Social Work Note (Signed)
CSW received handoff for patient. Patient is for SNF placement. Per CSW it has been difficult to get in touch with patient's brother. Patient has also been referred for a Level II PASRR. Case has been assigned to a "[facility]". Patient has 4 bed offers, but we need a PASRR number before the patient can go to a SNF.   Wesley Sullivan Wesley Sullivan, [place]WA, [facility], [phone]

## 2013-04-04 NOTE — Progress Notes (Signed)
OT Cancellation Note  Patient Details Name: Wesley HeightRobert L Sullivan MRN: [id number] DOB: 09/19/1940   Cancelled Treatment:    Reason Eval/Treat Not Completed: Other (comment).Per chart pt is Medicare and plan is for SNF. Will defer OT eval to that facility. Please let OT know if eval is needed by contacting [phone] or going to [email or web address] (automatically changes to [facility]) or using [employer] thru the Web Links tab on EPIC and do an all OT page at [phone].      Evette GeorgesLeonard, Wesley Sullivan Eva [phone] 04/04/2013, 7:55 AM

## 2013-04-04 NOTE — Progress Notes (Signed)
Since patient medicines, vancomycin, dilantin, depacon and cefepime was given at incorrect times during PM shift due to no IV access. Pharmacy was called to see if next dosages times needed to be changed. RN spoke with pharmacist Barkley BrunsVeronda. Will continue to monitor.   Wesley Ashmead J. Lendell CapriceSullivan RN

## 2013-04-04 NOTE — Progress Notes (Signed)
Pt agitated and combative even after given 2mg  haldol IM at 2137. Pt presenting as a danger to himself and staff. Pt currently without IV access. Spoke to Home DepotLynch, NP on call, given order for one time dose of 1mg  Ativan IM. Will continue to monitor to pt.

## 2013-04-04 NOTE — Progress Notes (Signed)
Patient is extremely combative and will not allow staff to place an IV access. Pt has and order for haldol but the route is for IV only. Provider on call for Thedore MinsSingh was paged and order was changed to IM. Will administer as soon as med is available. IV access again will be attempted once pt has calmed down and is no longer a danger to himself or staff. Will continue to monitor.   Wesley Sullivan Wesley Sullivan

## 2013-04-04 NOTE — Clinical Social Work Note (Signed)
CSW has not heard from "Billy CoastLori Fox" about patient's Level II PASRR. CSW contacted [facility]. Representative stated that Lawson FiscalLori will contact CSW "soon". CSW asked representative to update there note requesting Lori contact unit CSW directly. CSW will continue to follow.  Roddie McBryant Coreena Rubalcava, [place]ushing[place]CSWA, [place], [phone]

## 2013-04-04 NOTE — Progress Notes (Signed)
Patient Demographics  Wesley MarshallRobert Manning, is a 73 y.o. male, DOB - Jul 17, 1940, ZOX:[id number]  Admit date - 03/31/2013   Admitting Physician Marinda ElkAbraham Feliz Ortiz, MD  Outpatient Primary MD for the patient is Geraldo PitterBLAND,VEITA J, MD  LOS - 4   Chief Complaint  Patient presents with  . Weakness        Assessment & Plan    Brief HPI:   Wesley HeightRobert L Sullivan is a 73 y.o. Male, has past medical history of mental retardation, dementia, seizure disorders and hypertension that comes in for confusion that started 3 days prior to admission to from the hospital and as per caregiver as patient cannot provide any history . He was found to be dehydrated, hypernatremic, febrile with leukocytosis, . He was admitted to medical service and is being managed for health care associated pneumonia and acute encephalopathy. His influenza PCR is negative. His urine for streptococcal antigen is positive.      Assessment/Plan:    Sepsis/ Leukocytosis/ Toxic encephalopathy: Secondary to HCAP, urine positive for strep pneumonia, has clinically defervesced, will place on oral Levaquin for 5 more days. Speech following continue aspiration precautions. Increase activity today.      Chronic Thrombocytopenia: Currently stable continue to monitor.   Lab Results  Component Value Date   PLT 84* 04/04/2013        MENTAL RETARDATION with acute toxic/metabolic encephalopathy. Due to combination of pneumonia and hyponatremia from dehydration. Continue providing supportive care.      HTN (hypertension)  controlled.      Hypernatremia - due to dehydration improved with IV fluids which will continue gently. Recheck BMP this am.     Severe protein caloric malnutrion: Seen by speech, placed on diet, will add protein  supplementation.    Dysphagia. Speech following currently on dysphagia 1 diet with aspiration precautions and honey thick liquids.      Code Status:    Family Communication:    Disposition Plan: SNF   Procedures  CT head, chest x-ray   Consults     Medications  Scheduled Meds: . feeding supplement (PRO-STAT SUGAR FREE 64)  30 mL Oral TID WC  . heparin  5,000 Units Subcutaneous 3 times per day  . levofloxacin  750 mg Oral Daily  . phenytoin (DILANTIN) IV  100 mg Intravenous BID  . potassium chloride  40 mEq Oral Once  . sodium chloride  3 mL Intravenous Q12H  . traZODone  50 mg Oral QHS  . valproate sodium  500 mg Intravenous Q12H   Continuous Infusions:   PRN Meds:.acetaminophen, haloperidol lactate, ondansetron (ZOFRAN) IV  DVT Prophylaxis   Heparin   Lab Results  Component Value Date   PLT 84* 04/04/2013    Antibiotics     Anti-infectives   Start     Dose/Rate Route Frequency Ordered Stop   04/04/13 1200  doxycycline (VIBRA-TABS) tablet 100 mg  Status:  Discontinued     100 mg Oral Every 12 hours 04/04/13 1024 04/04/13 1027   04/04/13 1030  levofloxacin (LEVAQUIN) tablet 750 mg    Comments:  Pharmacy can adjust a dose for pneumonia   750 mg Oral Daily 04/04/13 1027 04/09/13 0959   04/02/13 1230  ceFEPIme (MAXIPIME) 1 g in dextrose  5 % 50 mL IVPB  Status:  Discontinued     1 g 100 mL/hr over 30 Minutes Intravenous Every 12 hours 04/02/13 1211 04/04/13 1024   04/01/13 1000  oseltamivir (TAMIFLU) capsule 30 mg  Status:  Discontinued     30 mg Oral 2 times daily 04/01/13 0955 04/01/13 1021   03/31/13 2200  ceFEPIme (MAXIPIME) 1 g in dextrose 5 % 50 mL IVPB  Status:  Discontinued     1 g 100 mL/hr over 30 Minutes Intravenous 2 times daily 03/31/13 1941 03/31/13 2007   03/31/13 2200  oseltamivir (TAMIFLU) capsule 75 mg  Status:  Discontinued     75 mg Oral 2 times daily 03/31/13 1941 04/01/13 0955   03/31/13 2100  vancomycin (VANCOCIN) IVPB 1000 mg/200 mL  premix  Status:  Discontinued     1,000 mg 200 mL/hr over 60 Minutes Intravenous Every 24 hours 03/31/13 1956 04/04/13 1024   03/31/13 2100  ceFEPIme (MAXIPIME) 2 g in dextrose 5 % 50 mL IVPB  Status:  Discontinued     2 g 100 mL/hr over 30 Minutes Intravenous Every 24 hours 03/31/13 2007 04/02/13 1209   03/31/13 1700  vancomycin (VANCOCIN) IVPB 1000 mg/200 mL premix     1,000 mg 200 mL/hr over 60 Minutes Intravenous  Once 03/31/13 1617 04/02/13 0700   03/31/13 1630  piperacillin-tazobactam (ZOSYN) IVPB 3.375 g     3.375 g 100 mL/hr over 30 Minutes Intravenous  Once 03/31/13 1617 03/31/13 1743          Subjective:   Wesley Sullivan today is lying in bed and able to answer questions, does not appear to be in distress  Objective:   Filed Vitals:   04/03/13 1530 04/03/13 2040 04/04/13 0330 04/04/13 0529  BP: 123/84 153/93  128/76  Pulse: 74 88  67  Temp: 97.3 F (36.3 C) 97.3 F (36.3 C)  98 F (36.7 C)  TempSrc: Oral Oral  Axillary  Resp: 20 18  18   Sullivan:      Weight:   49.9 kg (110 lb 0.2 oz)   SpO2: 98% 96%  95%    Wt Readings from Last 3 Encounters:  04/04/13 49.9 kg (110 lb 0.2 oz)  03/20/13 51.256 kg (113 lb)  02/03/13 57.153 kg (126 lb)     Intake/Output Summary (Last 24 hours) at 04/04/13 1027 Last data filed at 04/04/13 0900  Gross per 24 hour  Intake 1690.5 ml  Output      0 ml  Net 1690.5 ml     Physical Exam  Lying in bed, unable to answer questions, [place].AT,PERRAL Supple Neck,No JVD, No cervical lymphadenopathy appriciated.  Symmetrical Chest wall movement, Good air movement bilaterally, CTAB RRR,No Gallops,Rubs or new Murmurs, No Parasternal Heave +ve B.Sounds, Abd Soft, Non tender, No organomegaly appriciated, No rebound - guarding or rigidity. No Cyanosis, Clubbing or edema, multiple small old wounds on both legs   Data Review   Micro Results Recent Results (from the past 240 hour(s))  CULTURE, BLOOD (ROUTINE X 2)     Status: None    Collection Time    03/31/13  2:40 PM      Result Value Ref Range Status   Specimen Description BLOOD RIGHT FOREARM   Final   Special Requests BOTTLES DRAWN AEROBIC AND ANAEROBIC 5CC   Final   Culture  Setup Time     Final   Value: 03/31/2013 21:13     Performed at [facility]  Culture     Final   Value:        BLOOD CULTURE RECEIVED NO GROWTH TO DATE CULTURE WILL BE HELD FOR 5 DAYS BEFORE ISSUING A FINAL NEGATIVE REPORT     Performed at [facility]   Report Status PENDING   Incomplete  URINE CULTURE     Status: None   Collection Time    03/31/13  3:11 PM      Result Value Ref Range Status   Specimen Description URINE, CATHETERIZED   Final   Special Requests NONE   Final   Culture  Setup Time     Final   Value: 03/31/2013 20:54     Performed at [facility] Count     Final   Value: NO GROWTH     Performed at [facility]   Culture     Final   Value: NO GROWTH     Performed at [facility]   Report Status 04/01/2013 FINAL   Final  CULTURE, BLOOD (ROUTINE X 2)     Status: None   Collection Time    03/31/13  6:10 PM      Result Value Ref Range Status   Specimen Description BLOOD ARM LEFT   Final   Special Requests BOTTLES DRAWN AEROBIC AND ANAEROBIC B 10CC R 5CC   Final   Culture  Setup Time     Final   Value: 03/31/2013 21:13     Performed at [facility]   Culture     Final   Value:        BLOOD CULTURE RECEIVED NO GROWTH TO DATE CULTURE WILL BE HELD FOR 5 DAYS BEFORE ISSUING A FINAL NEGATIVE REPORT     Performed at [facility]   Report Status PENDING   Incomplete  MRSA PCR SCREENING     Status: None   Collection Time    03/31/13  7:17 PM      Result Value Ref Range Status   MRSA by PCR NEGATIVE  NEGATIVE Final   Comment:            The GeneXpert MRSA Assay (FDA     approved for NASAL specimens     only), is one component of a     comprehensive MRSA colonization     surveillance program. It is not      intended to diagnose MRSA     infection nor to guide or     monitor treatment for     MRSA infections.    Radiology Reports Ct Head Wo Contrast  03/13/2013   CLINICAL DATA:  Fall.  Altered mental status.  EXAM: CT HEAD WITHOUT CONTRAST  TECHNIQUE: Contiguous axial images were obtained from the base of the skull through the vertex without intravenous contrast.  COMPARISON:  Head CT scan 08/16/2012.  FINDINGS: This cortical atrophy and chronic microvascular ischemic change. 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 No evidence of acute intracranial abnormality including infarction, hemorrhage, mass lesion, mass effect, midline shift or abnormal extra-axial fluid collection. There is partial visualization of a mucous retention cyst or polyp in the right maxillary sinus.  IMPRESSION: No acute finding.  Stable compared to prior exam.   Electronically Signed   By: Drusilla Kanner M.D.   On: 03/13/2013 17:09   Portable Chest 1 View  04/01/2013   CLINICAL DATA:  Cough and congestion  EXAM: PORTABLE CHEST - 1 VIEW  COMPARISON:  DG CHEST 1V PORT dated 03/31/2013  FINDINGS: The heart size and mediastinal contours are within normal limits. The visualized skeletal structures are unremarkable. Cardiac leads obscure detail. Mild central vascular fullness persists without overt edema. Probable crowding of the bronchovascular markings over the right lower lobe allowing for hypoaeration. Lungs are otherwise clear.  IMPRESSION: Probable crowding of bronchovascular markings over the right lower lobe although early pneumonia or atelectasis could appear similar. If symptoms persist, consider PA and lateral chest radiographs obtained at full inspiration when the patient is clinically able.   Electronically Signed   By: Christiana Pellant M.D.   On: 04/01/2013 07:41   Dg Chest Portable 1 View  03/31/2013   CLINICAL DATA:  Unresponsive.  EXAM: PORTABLE CHEST - 1 VIEW  COMPARISON:  08/16/2012  FINDINGS: Rotation to left.  Distortion of the mediastinal and cardiac silhouette.  Mild central pulmonary vascular prominence greater on the right.  Elevated right hemidiaphragm limiting evaluation of the right lung base.  No segmental consolidation.  Gas-filled bowel.  Left shoulder joint degenerative changes.  IMPRESSION: Rotated portable exam with mild central pulmonary vascular prominence slightly greater on the right.   Electronically Signed   By: Bridgett Larsson M.D.   On: 03/31/2013 13:58    CBC  Recent Labs Lab 03/31/13 1215 04/01/13 0223 04/02/13 0240 04/04/13 0810  WBC 22.3* 19.8* 12.0* 6.4  HGB 13.1 10.9* 11.7* 11.8*  HCT 39.8 33.0* 35.6* 35.2*  PLT 120* 123* 125* 84*  MCV 101.8* 101.9* 101.4* 98.1  MCH 33.5 33.6 33.3 32.9  MCHC 32.9 33.0 32.9 33.5  RDW 13.6 13.8 13.4 12.5  LYMPHSABS 1.1  --   --   --   MONOABS 1.6*  --   --   --   EOSABS 0.0  --   --   --   BASOSABS 0.0  --   --   --     Chemistries   Recent Labs Lab 03/31/13 1215 04/01/13 0223 04/02/13 0240 04/04/13 0810  NA 153* 151* 149* 140  K 5.0 4.1 3.8 3.4*  CL 110 115* 112 103  CO2 27 26 26 28   GLUCOSE 130* 125* 84 93  BUN 47* 35* 26* 20  CREATININE 1.09 0.94 0.80 0.62  CALCIUM 9.3 8.2* 8.4 8.4  AST 89* 68*  --   --   ALT 43 34  --   --   ALKPHOS 99 131*  --   --   BILITOT 0.9 0.7  --   --    ------------------------------------------------------------------------------------------------------------------ estimated creatinine clearance is 59.8 ml/min (by C-G formula based on Cr of 0.62). ------------------------------------------------------------------------------------------------------------------ No results found for this basename: HGBA1C,  in the last 72 hours ------------------------------------------------------------------------------------------------------------------ No results found for this basename: CHOL, HDL, LDLCALC, TRIG, CHOLHDL, LDLDIRECT,  in the last 72  hours ------------------------------------------------------------------------------------------------------------------ No results found for this basename: TSH, T4TOTAL, FREET3, T3FREE, THYROIDAB,  in the last 72 hours ------------------------------------------------------------------------------------------------------------------ No results found for this basename: VITAMINB12, FOLATE, FERRITIN, TIBC, IRON, RETICCTPCT,  in the last 72 hours  Coagulation profile No results found for this basename: INR, PROTIME,  in the last 168 hours  No results found for this basename: DDIMER,  in the last 72 hours  Cardiac Enzymes No results found for this basename: CK, CKMB, TROPONINI, MYOGLOBIN,  in the last 168 hours ------------------------------------------------------------------------------------------------------------------ No components found with this basename: POCBNP,      Time Spent in minutes   35   Susa Raring K M.D on 04/04/2013 at 10:27 AM  Between 7am to 7pm - Pager - [phone]  After 7pm go to [email or web address] - password [email or web address]  And look for the night coverage person covering for me after hours  [facility] Office  [phone]

## 2013-04-04 NOTE — Clinical Social Work Note (Signed)
CSW has attempted to reach brother and "Benetta SparKrissy" with group home. Messages have been left. CSW will wait for call back.   Roddie McBryant Kierstyn Baranowski, DowellLCSWA, [place], [phone]

## 2013-04-05 LAB — POTASSIUM: POTASSIUM: 3.8 meq/L (ref 3.7–5.3)

## 2013-04-05 MED ORDER — PHENYTOIN SODIUM EXTENDED 100 MG PO CAPS
200.0000 mg | ORAL_CAPSULE | Freq: Every day | ORAL | Status: DC
  Administered 2013-04-06 – 2013-04-08 (×3): 200 mg via ORAL
  Filled 2013-04-05 (×6): qty 2

## 2013-04-05 MED ORDER — LORAZEPAM 2 MG/ML IJ SOLN
1.0000 mg | Freq: Once | INTRAMUSCULAR | Status: AC
  Administered 2013-04-05: 1 mg via INTRAMUSCULAR

## 2013-04-05 MED ORDER — ENOXAPARIN SODIUM 30 MG/0.3ML ~~LOC~~ SOLN
30.0000 mg | Freq: Every day | SUBCUTANEOUS | Status: DC
  Administered 2013-04-05 – 2013-04-11 (×6): 30 mg via SUBCUTANEOUS
  Filled 2013-04-05 (×9): qty 0.3

## 2013-04-05 MED ORDER — LORAZEPAM 2 MG/ML IJ SOLN
INTRAMUSCULAR | Status: AC
  Filled 2013-04-05: qty 1

## 2013-04-05 MED ORDER — QUETIAPINE FUMARATE ER 400 MG PO TB24
400.0000 mg | ORAL_TABLET | Freq: Every day | ORAL | Status: DC
  Administered 2013-04-05 – 2013-04-09 (×4): 400 mg via ORAL
  Filled 2013-04-05 (×6): qty 1

## 2013-04-05 MED ORDER — LORAZEPAM 2 MG/ML IJ SOLN: 1.0000 mg | INTRAMUSCULAR | Status: DC | PRN

## 2013-04-05 MED ORDER — DIVALPROEX SODIUM 500 MG PO DR TAB
500.0000 mg | DELAYED_RELEASE_TABLET | Freq: Two times a day (BID) | ORAL | Status: DC
Start: 2013-04-05 — End: 2013-04-10
  Administered 2013-04-05 – 2013-04-09 (×7): 500 mg via ORAL
  Filled 2013-04-05 (×12): qty 1

## 2013-04-05 MED ORDER — LORAZEPAM 2 MG/ML IJ SOLN
1.0000 mg | INTRAMUSCULAR | Status: DC | PRN
  Administered 2013-04-06 – 2013-04-10 (×10): 1 mg via INTRAMUSCULAR
  Filled 2013-04-05 (×11): qty 1

## 2013-04-05 MED ORDER — LORAZEPAM 1 MG PO TABS
1.0000 mg | ORAL_TABLET | ORAL | Status: DC | PRN
  Filled 2013-04-05 (×2): qty 1

## 2013-04-05 MED ORDER — LORAZEPAM 1 MG PO TABS: 1.0000 mg | ORAL_TABLET | ORAL | Status: DC | PRN

## 2013-04-05 MED ORDER — LORAZEPAM 2 MG/ML IJ SOLN
1.0000 mg | INTRAMUSCULAR | Status: DC | PRN
Start: 2013-04-05 — End: 2013-04-05

## 2013-04-05 NOTE — Progress Notes (Signed)
Pt became very agitated. MD paged. Ordered 1 mg Ativan.

## 2013-04-05 NOTE — Progress Notes (Signed)
ANTIBIOTIC CONSULT NOTE - FOLLOW UP  Pharmacy Consult for Levaquin   Indication: pneumonia  Patient Weight: 49 kg   Labs:  Recent Labs  04/04/13 0810  WBC 6.4  HGB 11.8*  PLT 84*  CREATININE 0.62   Estimated Creatinine Clearance: 60.5 ml/min (by C-G formula based on Cr of 0.62).  97.7 F (36.5 C) (Core (Comment)) ,  Lab Results  Component Value Date   WBC 6.4 04/04/2013     Assessment: 72 YOM transitioned from Vanc + Cefepime to oral Levaquin x 5 days. Comfort care measures in place. WBC wnl. Pt is afebrile   Levaquin 2/13>>2/18   Goal of Therapy:  Eradication of infection   Plan:  1. Continue Levaquin 750 mg PO Q 24 hours 2. Monitor patient's clinical status.   Vinnie LevelBenjamin Aleea Hendry, PharmD.  Clinical Pharmacist Pager [phone]

## 2013-04-05 NOTE — Consult Note (Signed)
Full consult to follow.  Met with patient's Wesley Sullivan (brother)= goals are for comfort and QOL.  Hospice at SNF. Changed his meds to PO. Avoid restraints Comfort Feeding as tolerated I added back on his Seroquel.   Lane Hacker, DO Palliative Medicine

## 2013-04-05 NOTE — Progress Notes (Signed)
Pt c/o pain in area of iv site. IV site removed. Per MD order, no iv access necessary

## 2013-04-05 NOTE — Progress Notes (Signed)
Patient Demographics  Wesley Sullivan, is a 73 y.o. male, DOB - [birth year], ZOX:[id number]  Admit date - 03/31/2013   Admitting Physician Marinda Elk, MD  Outpatient Primary MD for the patient is [name], MD  LOS - 5   Chief Complaint  Patient presents with  . Weakness        Assessment & Plan    Brief HPI:   Wesley Sullivan is a 73 y.o. Male, has past medical history of mental retardation, dementia, seizure disorders and hypertension that comes in for confusion that started 3 days prior to admission to from the hospital and as per caregiver as patient cannot provide any history . He was found to be dehydrated, hypernatremic, febrile with leukocytosis, . He was admitted to medical service and is being managed for health care associated pneumonia and acute encephalopathy. His influenza PCR is negative. His urine for streptococcal antigen is positive.     WILL CONSULT PALL CARE ON LONGTERM CARE AND PLAN      Assessment/Plan:    Sepsis/ Leukocytosis/ Toxic encephalopathy: Secondary to HCAP, urine positive for strep pneumonia, has clinically defervesced, will place on oral Levaquin for 5 more days. Speech following continue aspiration precautions. Increase activity today.      Chronic Thrombocytopenia: Currently stable continue to monitor.   Lab Results  Component Value Date   PLT 84* 04/04/2013      MENTAL Retardation with acute toxic/metabolic encephalopathy. Due to combination of pneumonia and hyponatremia from dehydration. Continue providing supportive care.      HTN (hypertension)  controlled.      Hypernatremia - due to dehydration improved with IV fluids which will continue gently. Recheck BMP this am.     Severe protein caloric malnutrion: Seen by  speech, placed on diet, will add protein supplementation.     Dysphagia. Speech following currently on dysphagia 1 diet with aspiration precautions and honey thick liquids.      Code Status:  DNR  Family Communication:    Disposition Plan: SNF   Procedures  CT head, chest x-ray   Consults  PALL CARE   Medications  Scheduled Meds: . feeding supplement (PRO-STAT SUGAR FREE 64)  30 mL Oral TID WC  . heparin  5,000 Units Subcutaneous 3 times per day  . levofloxacin  750 mg Oral Daily  . LORazepam      . phenytoin (DILANTIN) IV  100 mg Intravenous BID  . sodium chloride  3 mL Intravenous Q12H  . traZODone  50 mg Oral QHS  . valproate sodium  500 mg Intravenous Q12H   Continuous Infusions:   PRN Meds:.acetaminophen, haloperidol lactate, LORazepam, ondansetron (ZOFRAN) IV  DVT Prophylaxis   Heparin   Lab Results  Component Value Date   PLT 84* 04/04/2013    Antibiotics     Anti-infectives   Start     Dose/Rate Route Frequency Ordered Stop   04/04/13 1200  doxycycline (VIBRA-TABS) tablet 100 mg  Status:  Discontinued     100 mg Oral Every 12 hours 04/04/13 1024 04/04/13 1027   04/04/13 1200  levofloxacin (LEVAQUIN) tablet 750 mg    Comments:  Pharmacy can adjust a dose for pneumonia   750 mg Oral Daily 04/04/13 1027 04/09/13  0981   04/02/13 1230  ceFEPIme (MAXIPIME) 1 g in dextrose 5 % 50 mL IVPB  Status:  Discontinued     1 g 100 mL/hr over 30 Minutes Intravenous Every 12 hours 04/02/13 1211 04/04/13 1024   04/01/13 1000  oseltamivir (TAMIFLU) capsule 30 mg  Status:  Discontinued     30 mg Oral 2 times daily 04/01/13 0955 04/01/13 1021   03/31/13 2200  ceFEPIme (MAXIPIME) 1 g in dextrose 5 % 50 mL IVPB  Status:  Discontinued     1 g 100 mL/hr over 30 Minutes Intravenous 2 times daily 03/31/13 1941 03/31/13 2007   03/31/13 2200  oseltamivir (TAMIFLU) capsule 75 mg  Status:  Discontinued     75 mg Oral 2 times daily 03/31/13 1941 04/01/13 0955   03/31/13 2100   vancomycin (VANCOCIN) IVPB 1000 mg/200 mL premix  Status:  Discontinued     1,000 mg 200 mL/hr over 60 Minutes Intravenous Every 24 hours 03/31/13 1956 04/04/13 1024   03/31/13 2100  ceFEPIme (MAXIPIME) 2 g in dextrose 5 % 50 mL IVPB  Status:  Discontinued     2 g 100 mL/hr over 30 Minutes Intravenous Every 24 hours 03/31/13 2007 04/02/13 1209   03/31/13 1700  vancomycin (VANCOCIN) IVPB 1000 mg/200 mL premix     1,000 mg 200 mL/hr over 60 Minutes Intravenous  Once 03/31/13 1617 04/02/13 0700   03/31/13 1630  piperacillin-tazobactam (ZOSYN) IVPB 3.375 g     3.375 g 100 mL/hr over 30 Minutes Intravenous  Once 03/31/13 1617 03/31/13 1743          Subjective:   Horton Marshall today is lying in bed and able to answer questions, does not appear to be in distress.   Objective:   Filed Vitals:   04/04/13 0529 04/04/13 1300 04/04/13 2056 04/05/13 0500  BP: 128/76 126/75 123/78 126/82  Pulse: 67 76 76 75  Temp: 98 F (36.7 C) 97.5 F (36.4 C) 96.9 F (36.1 C) 97.7 F (36.5 C)  TempSrc: Axillary Axillary Axillary Core (Comment)  Resp: 18 18 16 12   Height:      Weight:    50.5 kg (111 lb 5.3 oz)  SpO2: 95%  96% 100%    Wt Readings from Last 3 Encounters:  04/05/13 50.5 kg (111 lb 5.3 oz)  03/20/13 51.256 kg (113 lb)  02/03/13 57.153 kg (126 lb)     Intake/Output Summary (Last 24 hours) at 04/05/13 0932 Last data filed at 04/05/13 0546  Gross per 24 hour  Intake    210 ml  Output    525 ml  Net   -315 ml     Physical Exam  Lying in bed, unable to answer questions, [place].AT,PERRAL Supple Neck,No JVD, No cervical lymphadenopathy appriciated.  Symmetrical Chest wall movement, Good air movement bilaterally, CTAB RRR,No Gallops,Rubs or new Murmurs, No Parasternal Heave +ve B.Sounds, Abd Soft, Non tender, No organomegaly appriciated, No rebound - guarding or rigidity. No Cyanosis, Clubbing or edema, multiple small old wounds on both legs   Data Review   Micro  Results Recent Results (from the past 240 hour(s))  CULTURE, BLOOD (ROUTINE X 2)     Status: None   Collection Time    03/31/13  2:40 PM      Result Value Ref Range Status   Specimen Description BLOOD RIGHT FOREARM   Final   Special Requests BOTTLES DRAWN AEROBIC AND ANAEROBIC 5CC   Final   Culture  Setup Time  Final   Value: 03/31/2013 21:13     Performed at [facility]   Culture     Final   Value:        BLOOD CULTURE RECEIVED NO GROWTH TO DATE CULTURE WILL BE HELD FOR 5 DAYS BEFORE ISSUING A FINAL NEGATIVE REPORT     Performed at [facility]   Report Status PENDING   Incomplete  URINE CULTURE     Status: None   Collection Time    03/31/13  3:11 PM      Result Value Ref Range Status   Specimen Description URINE, CATHETERIZED   Final   Special Requests NONE   Final   Culture  Setup Time     Final   Value: 03/31/2013 20:54     Performed at [facility] Count     Final   Value: NO GROWTH     Performed at [facility]   Culture     Final   Value: NO GROWTH     Performed at [facility]   Report Status 04/01/2013 FINAL   Final  CULTURE, BLOOD (ROUTINE X 2)     Status: None   Collection Time    03/31/13  6:10 PM      Result Value Ref Range Status   Specimen Description BLOOD ARM LEFT   Final   Special Requests BOTTLES DRAWN AEROBIC AND ANAEROBIC B 10CC R 5CC   Final   Culture  Setup Time     Final   Value: 03/31/2013 21:13     Performed at [facility]   Culture     Final   Value:        BLOOD CULTURE RECEIVED NO GROWTH TO DATE CULTURE WILL BE HELD FOR 5 DAYS BEFORE ISSUING A FINAL NEGATIVE REPORT     Performed at [facility]   Report Status PENDING   Incomplete  MRSA PCR SCREENING     Status: None   Collection Time    03/31/13  7:17 PM      Result Value Ref Range Status   MRSA by PCR NEGATIVE  NEGATIVE Final   Comment:            The GeneXpert MRSA Assay (FDA     approved for NASAL specimens      only), is one component of a     comprehensive MRSA colonization     surveillance program. It is not     intended to diagnose MRSA     infection nor to guide or     monitor treatment for     MRSA infections.    Radiology Reports Ct Head Wo Contrast  03/13/2013   CLINICAL DATA:  Fall.  Altered mental status.  EXAM: CT HEAD WITHOUT CONTRAST  TECHNIQUE: Contiguous axial images were obtained from the base of the skull through the vertex without intravenous contrast.  COMPARISON:  Head CT scan 08/16/2012.  FINDINGS: This cortical atrophy and chronic microvascular ischemic change. 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 No evidence of acute intracranial abnormality including infarction, hemorrhage, mass lesion, mass effect, midline shift or abnormal extra-axial fluid collection. There is partial visualization of a mucous retention cyst or polyp in the right maxillary sinus.  IMPRESSION: No acute finding.  Stable compared to prior exam.   Electronically Signed   By: Drusilla Kanner M.D.   On: 03/13/2013 17:09   Portable Chest 1 View  04/01/2013  CLINICAL DATA:  Cough and congestion  EXAM: PORTABLE CHEST - 1 VIEW  COMPARISON:  DG CHEST 1V PORT dated 03/31/2013  FINDINGS: The heart size and mediastinal contours are within normal limits. The visualized skeletal structures are unremarkable. Cardiac leads obscure detail. Mild central vascular fullness persists without overt edema. Probable crowding of the bronchovascular markings over the right lower lobe allowing for hypoaeration. Lungs are otherwise clear.  IMPRESSION: Probable crowding of bronchovascular markings over the right lower lobe although early pneumonia or atelectasis could appear similar. If symptoms persist, consider PA and lateral chest radiographs obtained at full inspiration when the patient is clinically able.   Electronically Signed   By: Christiana PellantGretchen  Green M.D.   On: 04/01/2013 07:41   Dg Chest Portable 1 View  03/31/2013   CLINICAL DATA:   Unresponsive.  EXAM: PORTABLE CHEST - 1 VIEW  COMPARISON:  08/16/2012  FINDINGS: Rotation to left. Distortion of the mediastinal and cardiac silhouette.  Mild central pulmonary vascular prominence greater on the right.  Elevated right hemidiaphragm limiting evaluation of the right lung base.  No segmental consolidation.  Gas-filled bowel.  Left shoulder joint degenerative changes.  IMPRESSION: Rotated portable exam with mild central pulmonary vascular prominence slightly greater on the right.   Electronically Signed   By: Bridgett LarssonSteve  Olson M.D.   On: 03/31/2013 13:58    CBC  Recent Labs Lab 03/31/13 1215 04/01/13 0223 04/02/13 0240 04/04/13 0810  WBC 22.3* 19.8* 12.0* 6.4  HGB 13.1 10.9* 11.7* 11.8*  HCT 39.8 33.0* 35.6* 35.2*  PLT 120* 123* 125* 84*  MCV 101.8* 101.9* 101.4* 98.1  MCH 33.5 33.6 33.3 32.9  MCHC 32.9 33.0 32.9 33.5  RDW 13.6 13.8 13.4 12.5  LYMPHSABS 1.1  --   --   --   MONOABS 1.6*  --   --   --   EOSABS 0.0  --   --   --   BASOSABS 0.0  --   --   --     Chemistries   Recent Labs Lab 03/31/13 1215 04/01/13 0223 04/02/13 0240 04/04/13 0810 04/05/13 0535  NA 153* 151* 149* 140  --   K 5.0 4.1 3.8 3.4* 3.8  CL 110 115* 112 103  --   CO2 27 26 26 28   --   GLUCOSE 130* 125* 84 93  --   BUN 47* 35* 26* 20  --   CREATININE 1.09 0.94 0.80 0.62  --   CALCIUM 9.3 8.2* 8.4 8.4  --   AST 89* 68*  --   --   --   ALT 43 34  --   --   --   ALKPHOS 99 131*  --   --   --   BILITOT 0.9 0.7  --   --   --    ------------------------------------------------------------------------------------------------------------------ estimated creatinine clearance is 60.5 ml/min (by C-G formula based on Cr of 0.62). ------------------------------------------------------------------------------------------------------------------ No results found for this basename: HGBA1C,  in the last 72  hours ------------------------------------------------------------------------------------------------------------------ No results found for this basename: CHOL, HDL, LDLCALC, TRIG, CHOLHDL, LDLDIRECT,  in the last 72 hours ------------------------------------------------------------------------------------------------------------------ No results found for this basename: TSH, T4TOTAL, FREET3, T3FREE, THYROIDAB,  in the last 72 hours ------------------------------------------------------------------------------------------------------------------ No results found for this basename: VITAMINB12, FOLATE, FERRITIN, TIBC, IRON, RETICCTPCT,  in the last 72 hours  Coagulation profile No results found for this basename: INR, PROTIME,  in the last 168 hours  No results found for this basename: DDIMER,  in the last  72 hours  Cardiac Enzymes No results found for this basename: CK, CKMB, TROPONINI, MYOGLOBIN,  in the last 168 hours ------------------------------------------------------------------------------------------------------------------ No components found with this basename: POCBNP,      Time Spent in minutes   35   Susa Raring K M.D on 04/05/2013 at 9:32 AM  Between 7am to 7pm - Pager - [phone]  After 7pm go to [email or web address] - password [email or web address]  And look for the night coverage person covering for me after hours  [facility] Office  [phone]

## 2013-04-05 NOTE — Progress Notes (Signed)
Pt refusing to have anything by mouth. Will continue to monitor.

## 2013-04-06 LAB — CULTURE, BLOOD (ROUTINE X 2)
Culture: NO GROWTH
Culture: NO GROWTH

## 2013-04-06 NOTE — Progress Notes (Signed)
Patient Demographics  Wesley Sullivan, is a 73 y.o. male, DOB - 08-28-1940, ZOX:[id number]  Admit date - 03/31/2013   Admitting Physician Marinda Elk, MD  Outpatient Primary MD for the patient is [name], MD  LOS - 6   Chief Complaint  Patient presents with  . Weakness        Assessment & Plan    Brief HPI:   Wesley Sullivan is a 73 y.o. Male, has past medical history of mental retardation, dementia, seizure disorders and hypertension that comes in for confusion that started 3 days prior to admission to from the hospital and as per caregiver as patient cannot provide any history . He was found to be dehydrated, hypernatremic, febrile with leukocytosis, . He was admitted to medical service and is being managed for health care associated pneumonia and acute encephalopathy. His influenza PCR is negative. His urine for streptococcal antigen is positive.     Differential care consulted and we are trying to place the patient with hospice at a nursing facility      Assessment/Plan:    Sepsis/ Leukocytosis/ Toxic encephalopathy: Secondary to HCAP, urine positive for strep pneumonia, has clinically defervesced, will place on oral Levaquin for 5 more days. Speech following continue aspiration precautions. Increase activity today.      Chronic Thrombocytopenia: Currently stable continue to monitor.   Lab Results  Component Value Date   PLT 84* 04/04/2013      MENTAL Retardation with acute toxic/metabolic encephalopathy. Due to combination of pneumonia and hyponatremia from dehydration. Continue providing supportive care.      HTN (hypertension)  controlled.      Hypernatremia - due to dehydration improved with IV fluids which will continue gently. Recheck BMP this  am.     Severe protein caloric malnutrion: Seen by speech, placed on diet, will add protein supplementation.     Dysphagia. Speech following currently on dysphagia 1 diet with aspiration precautions and honey thick liquids.      Code Status:  DNR  Family Communication:    Disposition Plan: SNF with hospice   Procedures  CT head, chest x-ray   Consults  [facility]   Medications  Scheduled Meds: . divalproex  500 mg Oral Q12H  . enoxaparin (LOVENOX) injection  30 mg Subcutaneous Daily  . feeding supplement (PRO-STAT SUGAR FREE 64)  30 mL Oral TID WC  . levofloxacin  750 mg Oral Daily  . phenytoin  200 mg Oral Daily  . QUEtiapine  400 mg Oral QHS  . sodium chloride  3 mL Intravenous Q12H  . traZODone  50 mg Oral QHS   Continuous Infusions:   PRN Meds:.acetaminophen, LORazepam, LORazepam  DVT Prophylaxis   Heparin   Lab Results  Component Value Date   PLT 84* 04/04/2013    Antibiotics     Anti-infectives   Start     Dose/Rate Route Frequency Ordered Stop   04/04/13 1200  doxycycline (VIBRA-TABS) tablet 100 mg  Status:  Discontinued     100 mg Oral Every 12 hours 04/04/13 1024 04/04/13 1027   04/04/13 1200  levofloxacin (LEVAQUIN) tablet 750 mg    Comments:  Pharmacy can adjust a dose for pneumonia   750 mg Oral Daily  04/04/13 1027 04/09/13 0959   04/02/13 1230  ceFEPIme (MAXIPIME) 1 g in dextrose 5 % 50 mL IVPB  Status:  Discontinued     1 g 100 mL/hr over 30 Minutes Intravenous Every 12 hours 04/02/13 1211 04/04/13 1024   04/01/13 1000  oseltamivir (TAMIFLU) capsule 30 mg  Status:  Discontinued     30 mg Oral 2 times daily 04/01/13 0955 04/01/13 1021   03/31/13 2200  ceFEPIme (MAXIPIME) 1 g in dextrose 5 % 50 mL IVPB  Status:  Discontinued     1 g 100 mL/hr over 30 Minutes Intravenous 2 times daily 03/31/13 1941 03/31/13 2007   03/31/13 2200  oseltamivir (TAMIFLU) capsule 75 mg  Status:  Discontinued     75 mg Oral 2 times daily 03/31/13 1941 04/01/13  0955   03/31/13 2100  vancomycin (VANCOCIN) IVPB 1000 mg/200 mL premix  Status:  Discontinued     1,000 mg 200 mL/hr over 60 Minutes Intravenous Every 24 hours 03/31/13 1956 04/04/13 1024   03/31/13 2100  ceFEPIme (MAXIPIME) 2 g in dextrose 5 % 50 mL IVPB  Status:  Discontinued     2 g 100 mL/hr over 30 Minutes Intravenous Every 24 hours 03/31/13 2007 04/02/13 1209   03/31/13 1700  vancomycin (VANCOCIN) IVPB 1000 mg/200 mL premix     1,000 mg 200 mL/hr over 60 Minutes Intravenous  Once 03/31/13 1617 04/02/13 0700   03/31/13 1630  piperacillin-tazobactam (ZOSYN) IVPB 3.375 g     3.375 g 100 mL/hr over 30 Minutes Intravenous  Once 03/31/13 1617 03/31/13 1743          Subjective:   Wesley Sullivan today is lying in bed and able to answer questions, does not appear to be in distress.   Objective:   Filed Vitals:   04/05/13 0500 04/05/13 1336 04/05/13 2244 04/06/13 0450  BP: 126/82 126/80 118/67 96/62  Pulse: 75 64 80 79  Temp: 97.7 F (36.5 C)  97.9 F (36.6 C) 97.7 F (36.5 C)  TempSrc: Core (Comment)  Axillary Axillary  Resp: 12 16 16 16   Height:      Weight: 50.5 kg (111 lb 5.3 oz)     SpO2: 100% 100% 95% 98%    Wt Readings from Last 3 Encounters:  04/05/13 50.5 kg (111 lb 5.3 oz)  03/20/13 51.256 kg (113 lb)  02/03/13 57.153 kg (126 lb)    No intake or output data in the 24 hours ending 04/06/13 0852   Physical Exam  Lying in bed, unable to answer questions, [place].AT,PERRAL Supple Neck,No JVD, No cervical lymphadenopathy appriciated.  Symmetrical Chest wall movement, Good air movement bilaterally, CTAB RRR,No Gallops,Rubs or new Murmurs, No Parasternal Heave +ve B.Sounds, Abd Soft, Non tender, No organomegaly appriciated, No rebound - guarding or rigidity. No Cyanosis, Clubbing or edema, multiple small old wounds on both legs   Data Review   Micro Results Recent Results (from the past 240 hour(s))  CULTURE, BLOOD (ROUTINE X 2)     Status: None    Collection Time    03/31/13  2:40 PM      Result Value Ref Range Status   Specimen Description BLOOD RIGHT FOREARM   Final   Special Requests BOTTLES DRAWN AEROBIC AND ANAEROBIC 5CC   Final   Culture  Setup Time     Final   Value: 03/31/2013 21:13     Performed at [facility]   Culture     Final   Value:  BLOOD CULTURE RECEIVED NO GROWTH TO DATE CULTURE WILL BE HELD FOR 5 DAYS BEFORE ISSUING A FINAL NEGATIVE REPORT     Performed at [facility]   Report Status PENDING   Incomplete  URINE CULTURE     Status: None   Collection Time    03/31/13  3:11 PM      Result Value Ref Range Status   Specimen Description URINE, CATHETERIZED   Final   Special Requests NONE   Final   Culture  Setup Time     Final   Value: 03/31/2013 20:54     Performed at [facility] Count     Final   Value: NO GROWTH     Performed at [facility]   Culture     Final   Value: NO GROWTH     Performed at [facility]   Report Status 04/01/2013 FINAL   Final  CULTURE, BLOOD (ROUTINE X 2)     Status: None   Collection Time    03/31/13  6:10 PM      Result Value Ref Range Status   Specimen Description BLOOD ARM LEFT   Final   Special Requests BOTTLES DRAWN AEROBIC AND ANAEROBIC B 10CC R 5CC   Final   Culture  Setup Time     Final   Value: 03/31/2013 21:13     Performed at [facility]   Culture     Final   Value:        BLOOD CULTURE RECEIVED NO GROWTH TO DATE CULTURE WILL BE HELD FOR 5 DAYS BEFORE ISSUING A FINAL NEGATIVE REPORT     Performed at [facility]   Report Status PENDING   Incomplete  MRSA PCR SCREENING     Status: None   Collection Time    03/31/13  7:17 PM      Result Value Ref Range Status   MRSA by PCR NEGATIVE  NEGATIVE Final   Comment:            The GeneXpert MRSA Assay (FDA     approved for NASAL specimens     only), is one component of a     comprehensive MRSA colonization     surveillance program. It is not      intended to diagnose MRSA     infection nor to guide or     monitor treatment for     MRSA infections.    Radiology Reports Ct Head Wo Contrast  03/13/2013   CLINICAL DATA:  Fall.  Altered mental status.  EXAM: CT HEAD WITHOUT CONTRAST  TECHNIQUE: Contiguous axial images were obtained from the base of the skull through the vertex without intravenous contrast.  COMPARISON:  Head CT scan 08/16/2012.  FINDINGS: This cortical atrophy and chronic microvascular ischemic change. 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 No evidence of acute intracranial abnormality including infarction, hemorrhage, mass lesion, mass effect, midline shift or abnormal extra-axial fluid collection. There is partial visualization of a mucous retention cyst or polyp in the right maxillary sinus.  IMPRESSION: No acute finding.  Stable compared to prior exam.   Electronically Signed   By: Drusilla Kanner M.D.   On: 03/13/2013 17:09   Portable Chest 1 View  04/01/2013   CLINICAL DATA:  Cough and congestion  EXAM: PORTABLE CHEST - 1 VIEW  COMPARISON:  DG CHEST 1V PORT dated 03/31/2013  FINDINGS: The heart size and mediastinal contours are within  normal limits. The visualized skeletal structures are unremarkable. Cardiac leads obscure detail. Mild central vascular fullness persists without overt edema. Probable crowding of the bronchovascular markings over the right lower lobe allowing for hypoaeration. Lungs are otherwise clear.  IMPRESSION: Probable crowding of bronchovascular markings over the right lower lobe although early pneumonia or atelectasis could appear similar. If symptoms persist, consider PA and lateral chest radiographs obtained at full inspiration when the patient is clinically able.   Electronically Signed   By: Christiana PellantGretchen  Green M.D.   On: 04/01/2013 07:41   Dg Chest Portable 1 View  03/31/2013   CLINICAL DATA:  Unresponsive.  EXAM: PORTABLE CHEST - 1 VIEW  COMPARISON:  08/16/2012  FINDINGS: Rotation to left.  Distortion of the mediastinal and cardiac silhouette.  Mild central pulmonary vascular prominence greater on the right.  Elevated right hemidiaphragm limiting evaluation of the right lung base.  No segmental consolidation.  Gas-filled bowel.  Left shoulder joint degenerative changes.  IMPRESSION: Rotated portable exam with mild central pulmonary vascular prominence slightly greater on the right.   Electronically Signed   By: Bridgett LarssonSteve  Olson M.D.   On: 03/31/2013 13:58    CBC  Recent Labs Lab 03/31/13 1215 04/01/13 0223 04/02/13 0240 04/04/13 0810  WBC 22.3* 19.8* 12.0* 6.4  HGB 13.1 10.9* 11.7* 11.8*  HCT 39.8 33.0* 35.6* 35.2*  PLT 120* 123* 125* 84*  MCV 101.8* 101.9* 101.4* 98.1  MCH 33.5 33.6 33.3 32.9  MCHC 32.9 33.0 32.9 33.5  RDW 13.6 13.8 13.4 12.5  LYMPHSABS 1.1  --   --   --   MONOABS 1.6*  --   --   --   EOSABS 0.0  --   --   --   BASOSABS 0.0  --   --   --     Chemistries   Recent Labs Lab 03/31/13 1215 04/01/13 0223 04/02/13 0240 04/04/13 0810 04/05/13 0535  NA 153* 151* 149* 140  --   K 5.0 4.1 3.8 3.4* 3.8  CL 110 115* 112 103  --   CO2 27 26 26 28   --   GLUCOSE 130* 125* 84 93  --   BUN 47* 35* 26* 20  --   CREATININE 1.09 0.94 0.80 0.62  --   CALCIUM 9.3 8.2* 8.4 8.4  --   AST 89* 68*  --   --   --   ALT 43 34  --   --   --   ALKPHOS 99 131*  --   --   --   BILITOT 0.9 0.7  --   --   --    ------------------------------------------------------------------------------------------------------------------ estimated creatinine clearance is 60.5 ml/min (by C-G formula based on Cr of 0.62). ------------------------------------------------------------------------------------------------------------------ No results found for this basename: HGBA1C,  in the last 72 hours ------------------------------------------------------------------------------------------------------------------ No results found for this basename: CHOL, HDL, LDLCALC, TRIG, CHOLHDL,  LDLDIRECT,  in the last 72 hours ------------------------------------------------------------------------------------------------------------------ No results found for this basename: TSH, T4TOTAL, FREET3, T3FREE, THYROIDAB,  in the last 72 hours ------------------------------------------------------------------------------------------------------------------ No results found for this basename: VITAMINB12, FOLATE, FERRITIN, TIBC, IRON, RETICCTPCT,  in the last 72 hours  Coagulation profile No results found for this basename: INR, PROTIME,  in the last 168 hours  No results found for this basename: DDIMER,  in the last 72 hours  Cardiac Enzymes No results found for this basename: CK, CKMB, TROPONINI, MYOGLOBIN,  in the last 168 hours ------------------------------------------------------------------------------------------------------------------ No components found with this basename: POCBNP,  Time Spent in minutes   35   Qadir Folks K M.D on 04/06/2013 at 8:52 AM  Between 7am to 7pm - Pager - [phone]  After 7pm go to [email or web address] - password [email or web address]  And look for the night coverage person covering for me after hours  [facility] Office  [phone]

## 2013-04-07 MED ORDER — LORAZEPAM 1 MG PO TABS: 1.0000 mg | ORAL_TABLET | ORAL | Status: DC | PRN

## 2013-04-07 MED ORDER — MORPHINE SULFATE (CONCENTRATE) 10 MG /0.5 ML PO SOLN: 10.0000 mg | ORAL | Status: DC | PRN

## 2013-04-07 NOTE — Discharge Instructions (Signed)
Being discharged under the care of hospice.    Goal of care is comfort care.   Comfort feeds with dysphagia 1 diet and honey thick liquids, aspiration precautions   Activity as tolerated with assistance and fall precautions

## 2013-04-07 NOTE — Progress Notes (Signed)
Nutrition Brief Note  Chart reviewed. Pt now transitioning to comfort care.  No further nutrition interventions warranted at this time.  Please re-consult as needed.   Ruhi Kopke MS, RD, LDN Inpatient Registered Dietitian Pager: [phone] After-hours pager: [phone]    

## 2013-04-07 NOTE — Progress Notes (Signed)
Physical Therapy Discharge Patient Details Name: Wesley Sullivan HeightRobert L Perkey MRN: [id number] DOB: 1941/01/01 Today's Date: 04/07/2013 Time:  -     Patient discharged from PT services secondary to medical decline (pt hospice with comfort care only). Please see latest therapy progress note for current level of functioning and progress toward goals.    Progress and discharge plan discussed with patient and/or caregiver: Patient unable to participate in discharge planning and no caregivers available  GP     [facility] IncMAYCOCK,Coni Homesley 04/07/2013, 9:39 AM

## 2013-04-07 NOTE — Progress Notes (Signed)
PT Cancellation Note  Patient Details Name: Karalee HeightRobert L Koontz MRN: [id number] DOB: 1940/12/05   Cancelled Treatment:    Reason Eval/Treat Not Completed: Other (comment) (Pt now hospice with comfort care only.)   [facility] CenterMAYCOCK,Brecklyn Galvis 04/07/2013, 9:39 AM

## 2013-04-07 NOTE — Discharge Summary (Signed)
Wesley Sullivan, is a 73 y.o. male  DOB 1941-01-22  MRN [id number].  Admission date:  03/31/2013  Admitting Physician  Marinda Elk, MD  Discharge Date:  04/07/2013   Primary MD  [name], MD  Recommendations for primary care physician for things to follow:   Patient is being discharged with hospice. Goal of care is comfort only   Admission Diagnosis  Sepsis [038.9, 995.91]   Discharge Diagnosis  Sepsis [038.9, 995.91]  from pneumonia likely HCAP  Active Problems:   THROMBOCYTOPENIA   MENTAL RETARDATION   HTN (hypertension)   Sepsis   Hypernatremia   Leukocytosis   Toxic encephalopathy   Severe protein-calorie malnutrition   Septic shock      Past Medical History  Diagnosis Date  . Cerebral palsy   . Mental retardation   . Diabetes mellitus   . Dementia   . Seizures   . Gastroesophageal reflux disease   . Acute renal failure   . Constipation   . Speech impairment   . Hypertension   . Anemia     History reviewed. No pertinent past surgical history.   Discharge Condition: Stable with poor term prognosis, now hospice   Follow UP  Follow-up Information   Follow up with [name], MD. (As desired, now hospice)    Specialty:  Family Medicine   Contact information:   [address] [place] [address] [phone]         Discharge Instructions  and  Discharge Medications          Discharge Orders   Future Appointments Provider Department Dept Phone   06/20/2013 1:30 PM Levert Feinstein, MD [facility] [phone]   02/02/2014 10:30 AM Nilda Riggs, NP [facility] [phone]   Future Orders Complete By Expires   Discharge instructions  As directed    Comments:     Being discharged under the care of hospice.    Goal of care  is comfort care.   Comfort feeds with dysphagia 1 diet and honey thick liquids, aspiration precautions   Activity as tolerated with assistance and fall precautions   Increase activity slowly  As directed        Medication List    STOP taking these medications       cholecalciferol 1000 UNITS tablet  Commonly known as:  VITAMIN D      TAKE these medications       cloNIDine 0.1 mg/24hr patch  Commonly known as:  CATAPRES - Dosed in mg/24 hr  Place 0.1 mg onto the skin once a week.     divalproex 500 MG DR tablet  Commonly known as:  DEPAKOTE  Take 500 mg by mouth 2 (two) times daily.     ENSURE PLUS Liqd  Take 237 mLs by mouth daily. Drink one can with every meal     LORazepam 1 MG tablet  Commonly known as:  ATIVAN  Take 1 tablet (1 mg total) by mouth every  4 (four) hours as needed for anxiety.     Melatonin 1 MG Caps  Take 1 capsule by mouth daily. Takes at 7pm daily     morphine CONCENTRATE 10 mg / 0.5 ml concentrated solution  Take 0.5 mLs (10 mg total) by mouth every 3 (three) hours as needed for moderate pain, severe pain or shortness of breath.     phenytoin 100 MG ER capsule  Commonly known as:  DILANTIN  Take 2 capsules (200 mg total) by mouth daily.     QUEtiapine 400 MG 24 hr tablet  Commonly known as:  SEROQUEL XR  Take 400 mg by mouth at bedtime.     QUEtiapine 25 MG tablet  Commonly known as:  SEROQUEL  Take 25 mg by mouth daily.     traZODone 100 MG tablet  Commonly known as:  DESYREL  Take 200 mg by mouth at bedtime.          Diet and Activity recommendation: See Discharge Instructions above   Consults obtained - palliative care   Major procedures and Radiology Reports - PLEASE review detailed and final reports for all details, in brief -       Ct Head Wo Contrast  03/13/2013   CLINICAL DATA:  Fall.  Altered mental status.  EXAM: CT HEAD WITHOUT CONTRAST  TECHNIQUE: Contiguous axial images were obtained from the base of the  skull through the vertex without intravenous contrast.  COMPARISON:  Head CT scan 08/16/2012.  FINDINGS: This cortical atrophy and chronic microvascular ischemic change. 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 No evidence of acute intracranial abnormality including infarction, hemorrhage, mass lesion, mass effect, midline shift or abnormal extra-axial fluid collection. There is partial visualization of a mucous retention cyst or polyp in the right maxillary sinus.  IMPRESSION: No acute finding.  Stable compared to prior exam.   Electronically Signed   By: Drusilla Kanner M.D.   On: 03/13/2013 17:09   Dg Chest Port 1 View  04/03/2013   CLINICAL DATA:  73 year old male with cough. Initial encounter. Cerebral palsy.  EXAM: PORTABLE CHEST - 1 VIEW  COMPARISON:  04/01/2013 and earlier.  FINDINGS: Portable AP semi upright view at at 1311 hrs. Lung volumes are within normal limits. Normal cardiac size and mediastinal contours. Visualized tracheal air column is within normal limits. No pneumothorax, pleural effusion, pulmonary edema or consolidation. Decreased perihilar patchy opacity on the right, residual opacity most conspicuous along the minor fissure. Advanced degenerative changes left shoulder.  IMPRESSION: Decreased but not resolved patchy right lung opacity. Favor multilobar pneumonia in this setting.   Electronically Signed   By: Augusto Gamble M.D.   On: 04/03/2013 14:12   Portable Chest 1 View  04/01/2013   CLINICAL DATA:  Cough and congestion  EXAM: PORTABLE CHEST - 1 VIEW  COMPARISON:  DG CHEST 1V PORT dated 03/31/2013  FINDINGS: The heart size and mediastinal contours are within normal limits. The visualized skeletal structures are unremarkable. Cardiac leads obscure detail. Mild central vascular fullness persists without overt edema. Probable crowding of the bronchovascular markings over the right lower lobe allowing for hypoaeration. Lungs are otherwise clear.  IMPRESSION: Probable crowding of  bronchovascular markings over the right lower lobe although early pneumonia or atelectasis could appear similar. If symptoms persist, consider PA and lateral chest radiographs obtained at full inspiration when the patient is clinically able.   Electronically Signed   By: Christiana Pellant M.D.   On: 04/01/2013 07:41   Dg Chest  Portable 1 View  03/31/2013   CLINICAL DATA:  Unresponsive.  EXAM: PORTABLE CHEST - 1 VIEW  COMPARISON:  08/16/2012  FINDINGS: Rotation to left. Distortion of the mediastinal and cardiac silhouette.  Mild central pulmonary vascular prominence greater on the right.  Elevated right hemidiaphragm limiting evaluation of the right lung base.  No segmental consolidation.  Gas-filled bowel.  Left shoulder joint degenerative changes.  IMPRESSION: Rotated portable exam with mild central pulmonary vascular prominence slightly greater on the right.   Electronically Signed   By: Bridgett Larsson M.D.   On: 03/31/2013 13:58    Micro Results      Recent Results (from the past 240 hour(s))  CULTURE, BLOOD (ROUTINE X 2)     Status: None   Collection Time    03/31/13  2:40 PM      Result Value Ref Range Status   Specimen Description BLOOD RIGHT FOREARM   Final   Special Requests BOTTLES DRAWN AEROBIC AND ANAEROBIC 5CC   Final   Culture  Setup Time     Final   Value: 03/31/2013 21:13     Performed at [facility]   Culture     Final   Value: NO GROWTH 5 DAYS     Performed at [facility]   Report Status 04/06/2013 FINAL   Final  URINE CULTURE     Status: None   Collection Time    03/31/13  3:11 PM      Result Value Ref Range Status   Specimen Description URINE, CATHETERIZED   Final   Special Requests NONE   Final   Culture  Setup Time     Final   Value: 03/31/2013 20:54     Performed at [facility] Count     Final   Value: NO GROWTH     Performed at [facility]   Culture     Final   Value: NO GROWTH     Performed at [facility]     Report Status 04/01/2013 FINAL   Final  CULTURE, BLOOD (ROUTINE X 2)     Status: None   Collection Time    03/31/13  6:10 PM      Result Value Ref Range Status   Specimen Description BLOOD ARM LEFT   Final   Special Requests BOTTLES DRAWN AEROBIC AND ANAEROBIC B 10CC R 5CC   Final   Culture  Setup Time     Final   Value: 03/31/2013 21:13     Performed at [facility]   Culture     Final   Value: NO GROWTH 5 DAYS     Performed at [facility]   Report Status 04/06/2013 FINAL   Final  MRSA PCR SCREENING     Status: None   Collection Time    03/31/13  7:17 PM      Result Value Ref Range Status   MRSA by PCR NEGATIVE  NEGATIVE Final   Comment:            The GeneXpert MRSA Assay (FDA     approved for NASAL specimens     only), is one component of a     comprehensive MRSA colonization     surveillance program. It is not     intended to diagnose MRSA     infection nor to guide or     monitor treatment for     MRSA infections.  History of present illness and  Hospital Course:     Kindly see H&P for history of present illness and admission details, please review complete Labs, Consult reports and Test reports for all details in brief Wesley Sullivan, is a 73 y.o. male, patient with history of  dementia, mental retard additional, seizure disorder, hypertension who was admitted with a hospital with chief complaints of decline in his mental status his workup as indicated above HCAP for which he was treated with appropriate antibiotics with good results, however patient has very poor baseline quality of life and long term prognosis due to his underlying comorbidities of dementia and mental retardation, due to his poor quality of life and prognosis palliative care consultation was made, palliative care had detailed discussion with patient's power of attorney his brother Chrissie NoaWilliam who now agreed that patient should be hospice status and goal of care should be comfort  only.   At this point appropriate medications have been ordered with comfort being the primary goal, he will be discharged to nursing facility with hospice.         Today   Subjective:   Wesley Sullivan today remains nonverbal, appears to be in no distress,  Objective:   Blood pressure 106/67, pulse 80, temperature 98.3 F (36.8 C), temperature source Oral, resp. rate 18, height 5\' 6"  (1.676 m), weight 50.5 kg (111 lb 5.3 oz), SpO2 97.00%.   Intake/Output Summary (Last 24 hours) at 04/07/13 0813 Last data filed at 04/06/13 2221  Gross per 24 hour  Intake      0 ml  Output    475 ml  Net   -475 ml    Exam Lying in bed awake but unable to answer questions, No new F.N deficits all 4 extremities by self, Normal affect [place].AT,PERRAL Supple Neck,No JVD, No cervical lymphadenopathy appriciated.  Symmetrical Chest wall movement, Good air movement bilaterally, CTAB RRR,No Gallops,Rubs or new Murmurs, No Parasternal Heave +ve B.Sounds, Abd Soft, Non tender, No organomegaly appriciated, No rebound -guarding or rigidity. No Cyanosis, Clubbing or edema, No new Rash or bruise  Data Review   CBC w Diff: Lab Results  Component Value Date   WBC 6.4 04/04/2013   HGB 11.8* 04/04/2013   HCT 35.2* 04/04/2013   PLT 84* 04/04/2013   LYMPHOPCT 5* 03/31/2013   MONOPCT 7 03/31/2013   EOSPCT 0 03/31/2013   BASOPCT 0 03/31/2013    CMP: Lab Results  Component Value Date   NA 140 04/04/2013   K 3.8 04/05/2013   CL 103 04/04/2013   CO2 28 04/04/2013   BUN 20 04/04/2013   CREATININE 0.62 04/04/2013   PROT 6.5 04/01/2013   ALBUMIN 2.2* 04/01/2013   BILITOT 0.7 04/01/2013   ALKPHOS 131* 04/01/2013   AST 68* 04/01/2013   ALT 34 04/01/2013  .   Total Time in preparing paper work, data evaluation and todays exam - 35 minutes  Leroy SeaSINGH,Lochlyn Zullo K M.D on 04/07/2013 at 8:13 AM  [facility] Office  [phone]

## 2013-04-07 NOTE — Progress Notes (Signed)
SLP Cancellation Note  Patient Details Name: Wesley HeightRobert L Sullivan MRN: [id number] DOB: 1940/11/08   Cancelled treatment:       Reason Eval/Treat Not Completed: Other (comment). Pt has transitioned to comfort care with feeding as tolerated. Please call if any f/u is needed.   Harlon DittyBonnie Athel Merriweather, [place] CCC-SLP [phone]  Claudine MoutonDeBlois, Tasnim Balentine Caroline 04/07/2013, 12:17 PM

## 2013-04-08 NOTE — Progress Notes (Signed)
Patient Demographics  Wesley Sullivan, is a 73 y.o. male, DOB - 1940-09-13, AVW:[id number]  Admit date - 03/31/2013   Admitting Physician Marinda Elk, MD  Outpatient Primary MD for the patient is [name], MD  LOS - 8   Chief Complaint  Patient presents with  . Weakness        Assessment & Plan    Brief HPI:   Wesley Sullivan is a 73 y.o. Male, has past medical history of mental retardation, dementia, seizure disorders and hypertension that comes in for confusion that started 3 days prior to admission to from the hospital and as per caregiver as patient cannot provide any history . He was found to be dehydrated, hypernatremic, febrile with leukocytosis, . He was admitted to medical service and is being managed for health care associated pneumonia and acute encephalopathy. His influenza PCR is negative. His urine for streptococcal antigen is positive.     Palliative care consulted and we are trying to place the patient with hospice at a nursing facility, discharge summary done once bed available patient will be discharged. No changes.      Assessment/Plan:    Sepsis/ Leukocytosis/ Toxic encephalopathy: Secondary to HCAP, urine positive for strep pneumonia, has clinically defervesced, and continue oral Levaquin while he is here. Speech following continue aspiration precautions. Increase activity today.      Chronic Thrombocytopenia: Currently stable continue to monitor.   Lab Results  Component Value Date   PLT 84* 04/04/2013      MENTAL Retardation with acute toxic/metabolic encephalopathy. Due to combination of pneumonia and hyponatremia from dehydration. Continue providing supportive care.      HTN (hypertension)  controlled.      Hypernatremia - due to  dehydration improved with IV fluids which will continue gently. Recheck BMP this am.     Severe protein caloric malnutrion: Seen by speech, placed on diet, will add protein supplementation.     Dysphagia. Speech following currently on dysphagia 1 diet with aspiration precautions and honey thick liquids.      Code Status:  DNR  Family Communication:    Disposition Plan: SNF with hospice   Procedures  CT head, chest x-ray   Consults  PALL CARE   Medications  Scheduled Meds: . divalproex  500 mg Oral Q12H  . enoxaparin (LOVENOX) injection  30 mg Subcutaneous Daily  . feeding supplement (PRO-STAT SUGAR FREE 64)  30 mL Oral TID WC  . phenytoin  200 mg Oral Daily  . QUEtiapine  400 mg Oral QHS  . sodium chloride  3 mL Intravenous Q12H  . traZODone  50 mg Oral QHS   Continuous Infusions:   PRN Meds:.acetaminophen, LORazepam, LORazepam  DVT Prophylaxis   Heparin   Lab Results  Component Value Date   PLT 84* 04/04/2013    Antibiotics     Anti-infectives   Start     Dose/Rate Route Frequency Ordered Stop   04/04/13 1200  doxycycline (VIBRA-TABS) tablet 100 mg  Status:  Discontinued     100 mg Oral Every 12 hours 04/04/13 1024 04/04/13 1027   04/04/13 1200  levofloxacin (LEVAQUIN) tablet 750 mg    Comments:  Pharmacy can adjust a dose for pneumonia   750  mg Oral Daily 04/04/13 1027 04/08/13 1044   04/02/13 1230  ceFEPIme (MAXIPIME) 1 g in dextrose 5 % 50 mL IVPB  Status:  Discontinued     1 g 100 mL/hr over 30 Minutes Intravenous Every 12 hours 04/02/13 1211 04/04/13 1024   04/01/13 1000  oseltamivir (TAMIFLU) capsule 30 mg  Status:  Discontinued     30 mg Oral 2 times daily 04/01/13 0955 04/01/13 1021   03/31/13 2200  ceFEPIme (MAXIPIME) 1 g in dextrose 5 % 50 mL IVPB  Status:  Discontinued     1 g 100 mL/hr over 30 Minutes Intravenous 2 times daily 03/31/13 1941 03/31/13 2007   03/31/13 2200  oseltamivir (TAMIFLU) capsule 75 mg  Status:  Discontinued     75  mg Oral 2 times daily 03/31/13 1941 04/01/13 0955   03/31/13 2100  vancomycin (VANCOCIN) IVPB 1000 mg/200 mL premix  Status:  Discontinued     1,000 mg 200 mL/hr over 60 Minutes Intravenous Every 24 hours 03/31/13 1956 04/04/13 1024   03/31/13 2100  ceFEPIme (MAXIPIME) 2 g in dextrose 5 % 50 mL IVPB  Status:  Discontinued     2 g 100 mL/hr over 30 Minutes Intravenous Every 24 hours 03/31/13 2007 04/02/13 1209   03/31/13 1700  vancomycin (VANCOCIN) IVPB 1000 mg/200 mL premix     1,000 mg 200 mL/hr over 60 Minutes Intravenous  Once 03/31/13 1617 04/02/13 0700   03/31/13 1630  piperacillin-tazobactam (ZOSYN) IVPB 3.375 g     3.375 g 100 mL/hr over 30 Minutes Intravenous  Once 03/31/13 1617 03/31/13 1743          Subjective:   Horton Marshall today is lying in bed and able to answer questions, does not appear to be in distress.   Objective:   Filed Vitals:   04/07/13 0550 04/07/13 1437 04/07/13 2226 04/08/13 0506  BP: 106/67 103/68 107/66 138/82  Pulse: 80 76 85 77  Temp:  97.4 F (36.3 C) 98.7 F (37.1 C) 98.1 F (36.7 C)  TempSrc:  Oral Axillary Oral  Resp: 18 18 20 18   Height:      Weight:      SpO2: 97% 92% 98% 93%    Wt Readings from Last 3 Encounters:  04/05/13 50.5 kg (111 lb 5.3 oz)  03/20/13 51.256 kg (113 lb)  02/03/13 57.153 kg (126 lb)     Intake/Output Summary (Last 24 hours) at 04/08/13 1058 Last data filed at 04/08/13 1045  Gross per 24 hour  Intake    123 ml  Output      0 ml  Net    123 ml     Physical Exam  Lying in bed, unable to answer questions, [place].AT,PERRAL Supple Neck,No JVD, No cervical lymphadenopathy appriciated.  Symmetrical Chest wall movement, Good air movement bilaterally, CTAB RRR,No Gallops,Rubs or new Murmurs, No Parasternal Heave +ve B.Sounds, Abd Soft, Non tender, No organomegaly appriciated, No rebound - guarding or rigidity. No Cyanosis, Clubbing or edema, multiple small old wounds on both legs   Data Review    Micro Results Recent Results (from the past 240 hour(s))  CULTURE, BLOOD (ROUTINE X 2)     Status: None   Collection Time    03/31/13  2:40 PM      Result Value Ref Range Status   Specimen Description BLOOD RIGHT FOREARM   Final   Special Requests BOTTLES DRAWN AEROBIC AND ANAEROBIC 5CC   Final   Culture  Setup Time  Final   Value: 03/31/2013 21:13     [facility]   Culture     Final   Value: NO GROWTH 5 DAYS     [facility]formed at [facility]   Report Status 04/06/2013 FINAL   Final  URINE CULTURE     Status: None   Collection Time    03/31/13  3:11 PM      Result Value Ref Range Status   Specimen Description URINE, CATHETERIZED   Final   Special Requests NONE   Final   Culture  Setup Time     Final   Value: 03/31/2013 20:54     [facility]formed [facility]   Colony Count     Final   Value: NO GROWTH     [facility]formed at [facility]   Culture     Final   Value: NO GROWTH     Performed at [facility]   Report Status 04/01/2013 FINAL   Final  CULTURE, BLOOD (ROUTINE X 2)     Status: None   Collection Time    03/31/13  6:10 PM      Result Value Ref Range Status   Specimen Description BLOOD ARM LEFT   Final   Special Requests BOTTLES DRAWN AEROBIC AND ANAEROBIC B 10CC R 5CC   Final   Culture  Setup Time     Final   Value: 03/31/2013 21:13     [facility]formed at [facility]   Culture     Final   Value: NO GROWTH 5 DAYS     [facility]formed at [facility]   Report Status 04/06/2013 FINAL   Final  MRSA PCR SCREENING     Status: None   Collection Time    03/31/13  7:17 PM      Result Value Ref Range Status   MRSA by PCR NEGATIVE  NEGATIVE Final   Comment:            The GeneXpert MRSA Assay (FDA     approved for NASAL specimens     only), is one component of a     comprehensive MRSA colonization     surveillance program. It is not     intended to diagnose MRSA     infection nor to guide or     monitor treatment  for     MRSA infections.    Radiology Reports Ct Head Wo Contrast  03/13/2013   CLINICAL DATA:  Fall.  Altered mental status.  EXAM: CT HEAD WITHOUT CONTRAST  TECHNIQUE: Contiguous axial images were obtained from the base of the skull through the vertex without intravenous contrast.  COMPARISON:  Head CT scan 08/16/2012.  FINDINGS: This cortical atrophy and chronic microvascular ischemic change. 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 No evidence of acute intracranial abnormality including infarction, hemorrhage, mass lesion, mass effect, midline shift or abnormal extra-axial fluid collection. There is partial visualization of a mucous retention cyst or polyp in the right maxillary sinus.  IMPRESSION: No acute finding.  Stable compared to prior exam.   Electronically Signed   By: Drusilla Kannerhomas  Dalessio M.D.   On: 03/13/2013 17:09   Portable Chest 1 View  04/01/2013   CLINICAL DATA:  Cough and congestion  EXAM: PORTABLE CHEST - 1 VIEW  COMPARISON:  DG CHEST 1V PORT dated 03/31/2013  FINDINGS: The heart size and mediastinal contours are within normal limits. The visualized skeletal structures are unremarkable. Cardiac leads obscure  detail. Mild central vascular fullness persists without overt edema. Probable crowding of the bronchovascular markings over the right lower lobe allowing for hypoaeration. Lungs are otherwise clear.  IMPRESSION: Probable crowding of bronchovascular markings over the right lower lobe although early pneumonia or atelectasis could appear similar. If symptoms persist, consider PA and lateral chest radiographs obtained at full inspiration when the patient is clinically able.   Electronically Signed   By: Christiana Pellant M.D.   On: 04/01/2013 07:41   Dg Chest Portable 1 View  03/31/2013   CLINICAL DATA:  Unresponsive.  EXAM: PORTABLE CHEST - 1 VIEW  COMPARISON:  08/16/2012  FINDINGS: Rotation to left. Distortion of the mediastinal and cardiac silhouette.  Mild central pulmonary vascular  prominence greater on the right.  Elevated right hemidiaphragm limiting evaluation of the right lung base.  No segmental consolidation.  Gas-filled bowel.  Left shoulder joint degenerative changes.  IMPRESSION: Rotated portable exam with mild central pulmonary vascular prominence slightly greater on the right.   Electronically Signed   By: Bridgett Larsson M.D.   On: 03/31/2013 13:58    CBC  Recent Labs Lab 04/02/13 0240 04/04/13 0810  WBC 12.0* 6.4  HGB 11.7* 11.8*  HCT 35.6* 35.2*  PLT 125* 84*  MCV 101.4* 98.1  MCH 33.3 32.9  MCHC 32.9 33.5  RDW 13.4 12.5    Chemistries   Recent Labs Lab 04/02/13 0240 04/04/13 0810 04/05/13 0535  NA 149* 140  --   K 3.8 3.4* 3.8  CL 112 103  --   CO2 26 28  --   GLUCOSE 84 93  --   BUN 26* 20  --   CREATININE 0.80 0.62  --   CALCIUM 8.4 8.4  --    ------------------------------------------------------------------------------------------------------------------ estimated creatinine clearance is 60.5 ml/min (by C-G formula based on Cr of 0.62). ------------------------------------------------------------------------------------------------------------------ No results found for this basename: HGBA1C,  in the last 72 hours ------------------------------------------------------------------------------------------------------------------ No results found for this basename: CHOL, HDL, LDLCALC, TRIG, CHOLHDL, LDLDIRECT,  in the last 72 hours ------------------------------------------------------------------------------------------------------------------ No results found for this basename: TSH, T4TOTAL, FREET3, T3FREE, THYROIDAB,  in the last 72 hours ------------------------------------------------------------------------------------------------------------------ No results found for this basename: VITAMINB12, FOLATE, FERRITIN, TIBC, IRON, RETICCTPCT,  in the last 72 hours  Coagulation profile No results found for this basename: INR, PROTIME,   in the last 168 hours  No results found for this basename: DDIMER,  in the last 72 hours  Cardiac Enzymes No results found for this basename: CK, CKMB, TROPONINI, MYOGLOBIN,  in the last 168 hours ------------------------------------------------------------------------------------------------------------------ No components found with this basename: POCBNP,      Time Spent in minutes   35   Azora Bonzo K M.D on 04/08/2013 at 10:58 AM  Between 7am to 7pm - Pager - [phone]  After 7pm go to [email or web address] - password [email or web address]  And look for the night coverage person covering for me after hours  [facility] Office  [phone]

## 2013-04-08 NOTE — Clinical Social Work Note (Signed)
CSW made several attempts to contact [facility] about patient's PASRR. [facility] is closed due to the inclement weather. CSW to facilitate [facility] once Level II PASRR complete.   Wesley Sullivan Xzavien Harada, [place]CS[place], [place], [phone]

## 2013-04-09 MED ORDER — LORAZEPAM 2 MG/ML PO CONC: 1.0000 mg | ORAL | Status: DC | PRN

## 2013-04-09 NOTE — Progress Notes (Signed)
Palliative Medicine Team Progress Note  Goals are comfort. Patient is having issues with agitation, poor PO intake, and dysphagia. Recommend concentrated lorazepam solution sublingual scheduled every 4 hours at discharge- solution[facility]intensol [facility] but it is a routine hospice drug. Please verify that he will received FULL hospice services at the nursing home- if he is unable to take adequate PO his prognosis may be much shorter than anticipated. Discharge summary noted- please re-consult if his condition changes or we can be of further assistance. Will sign off for now.  Anderson MaltaElizabeth Hiren Peplinski, DO Palliative Medicine

## 2013-04-09 NOTE — Progress Notes (Signed)
No new changes. Vital signs stable. Awaiting Level II PASRR before patient can go [facility].

## 2013-04-10 MED ORDER — QUETIAPINE FUMARATE 25 MG PO TABS
25.0000 mg | ORAL_TABLET | Freq: Two times a day (BID) | ORAL | Status: DC
  Administered 2013-04-10 – 2013-04-11 (×2): 25 mg via ORAL
  Filled 2013-04-10 (×3): qty 1

## 2013-04-10 MED ORDER — PHENYTOIN 125 MG/5ML PO SUSP
200.0000 mg | Freq: Every day | ORAL | Status: DC
  Administered 2013-04-11: 200 mg via ORAL
  Filled 2013-04-10 (×2): qty 8

## 2013-04-10 MED ORDER — VALPROIC ACID 250 MG/5ML PO SYRP
500.0000 mg | ORAL_SOLUTION | Freq: Two times a day (BID) | ORAL | Status: DC
  Administered 2013-04-10 – 2013-04-11 (×2): 500 mg via ORAL
  Filled 2013-04-10 (×4): qty 10

## 2013-04-10 NOTE — Care Management Note (Addendum)
    Page 1 of 1   04/11/2013     9:54:02 AM   CARE MANAGEMENT NOTE 04/11/2013  Patient:  [name],Koy L   Account Number:  [id number]  Date Initiated:  04/03/2013  Documentation initiated by:  [facility]ALESIA  Subjective/Objective Assessment:   Sepsis/ Leukocytosis/ Toxic encephalopathy: Secondary to HCAP     Action[facility]Plan[facility]   Anticipated DC Date:  04/11/2013   Anticipated DC Plan:  SKILLED NURSING FACILITY  In-house referral  Clinical Social Worker      DC Planning Services  CM consult      Choice offered to / List presented to:             Status of service:  Completed, signed off Medicare Important Message given?   (If response is "NO", the following Medicare IM given date fields will be blank) Date Medicare IM given:   Date Additional Medicare IM given:    Discharge Disposition:  SKILLED NURSING FACILITY  Per UR Regulation:  Reviewed for med. necessity/level of care/duration of stay  If discussed at Long Length of Stay Meetings, dates discussed:   04/10/2013    Comments:  04/11/13 953 Letha Capeeborah Diamante Rubin RN, BSN [phone] received level two passar, patient for dc today, CSW following.  04/10/13 Letha Capeeborah Tatym Schermer RN, BSN [phone] awaiting level 2 passar. CSW following.

## 2013-04-10 NOTE — Clinical Social Work Note (Signed)
CSW has made residential hospice referrals with brother's permission.   Wesley Sullivan Trong Gosling, [place]CSWA, [place], [phone]

## 2013-04-10 NOTE — Progress Notes (Signed)
Given patient's palliative status, may not need to wait for level II PASRR.  [facility] may be an option.  Social work to follow up.

## 2013-04-10 NOTE — Clinical Social Work Note (Signed)
CSW has made several attempted to contact brother throughout the day to get his permission to make residential hospice referrals. CSW has left messages but has not heard back from brother. CSW has also contacted [facility] and [facility] several times about patient's PASRR, but they state that the information is still being reviewed. CSW has left several messages with Bethann BerkshireJohnny McMannus [phone]) who is handling case, but CSW has not received a call back. CSW looked into the option of the patient returning to group home with hospice services but [facility]etta SparKrissy states that they cannot provide these services at the group home. CSW will continue to try and reach the patient's brother so that residential hospice referrals can be made. [facility] stated that they will be able to provide FULL hospice services for patient. The [facility] is the [facility]'s preference.   Roddie McBryant Akon Reinoso, [place]CSWA, [place]AS[place], [phone]

## 2013-04-11 DIAGNOSIS — E119 Type 2 diabetes mellitus without complications: Secondary | ICD-10-CM

## 2013-04-11 DIAGNOSIS — R569 Unspecified convulsions: Secondary | ICD-10-CM

## 2013-04-11 MED ORDER — QUETIAPINE FUMARATE 25 MG PO TABS: 25.0000 mg | ORAL_TABLET | Freq: Two times a day (BID) | ORAL | Status: AC

## 2013-04-11 MED ORDER — HALOPERIDOL LACTATE 5 MG/ML IJ SOLN
5.0000 mg | Freq: Once | INTRAMUSCULAR | Status: AC
  Administered 2013-04-11: 5 mg via INTRAMUSCULAR
  Filled 2013-04-11: qty 1

## 2013-04-11 MED ORDER — HALOPERIDOL LACTATE 5 MG/ML IJ SOLN: 5.0000 mg | Freq: Once | INTRAMUSCULAR | Status: DC

## 2013-04-11 MED ORDER — PHENYTOIN 125 MG/5ML PO SUSP: 200.0000 mg | Freq: Every day | ORAL | Status: AC

## 2013-04-11 MED ORDER — LORAZEPAM 2 MG/ML PO CONC: 1.0000 mg | ORAL | Status: DC | PRN

## 2013-04-11 MED ORDER — VALPROIC ACID 250 MG/5ML PO SYRP: 500.0000 mg | ORAL_SOLUTION | Freq: Two times a day (BID) | ORAL | Status: AC

## 2013-04-11 NOTE — Discharge Summary (Signed)
Wesley HeightRobert L Sullivan, is a 73 y.o. male  DOB 07-19-1940  MRN [id number].  Admission date:  03/31/2013  Admitting Physician  Wesley ElkAbraham Feliz Ortiz, MD  Discharge Date:  04/11/2013   Primary MD  Wesley PitterBLAND,VEITA J, MD  Recommendations for primary care physician for things to follow:   Patient is being discharged with hospice. Goal of care is comfort only   Admission Diagnosis  Sepsis [038.9, 995.91]   Discharge Diagnosis  Sepsis [038.9, 995.91]  from pneumonia likely HCAP  Active Problems:   THROMBOCYTOPENIA   MENTAL RETARDATION   HTN (hypertension)   Sepsis   Hypernatremia   Leukocytosis   Toxic encephalopathy   Severe protein-calorie malnutrition   Septic shock      Past Medical History  Diagnosis Date  . Cerebral palsy   . Mental retardation   . Diabetes mellitus   . Dementia   . Seizures   . Gastroesophageal reflux disease   . Acute renal failure   . Constipation   . Speech impairment   . Hypertension   . Anemia     History reviewed. No pertinent past surgical history.   Discharge Condition: Stable with poor term prognosis, now hospice   Follow UP  Follow-up Information   Follow up with Wesley PitterBLAND,VEITA J, MD. (As desired, now hospice)    Specialty:  Family Medicine   Contact information:   [address] [place] [address] [phone]         Discharge Instructions  and  Discharge Medications          Discharge Orders   Future Appointments Provider Department Dept Phone   06/20/2013 1:30 PM Levert FeinsteinYijun Yan, MD [facility] [phone]   02/02/2014 10:30 AM Nilda RiggsNancy Carolyn Martin, NP [facility] [phone]   Future Orders Complete By Expires   Discharge instructions  As directed    Comments:     Being discharged under the care of hospice.    Goal of care  is comfort care.   Comfort feeds with dysphagia 1 diet and honey thick liquids, aspiration precautions   Activity as tolerated with assistance and fall precautions   Increase activity slowly  As directed        Medication List    STOP taking these medications       cholecalciferol 1000 UNITS tablet  Commonly known as:  VITAMIN D     phenytoin 100 MG ER capsule  Commonly known as:  DILANTIN      TAKE these medications       cloNIDine 0.1 mg/24hr patch  Commonly known as:  CATAPRES - Dosed in mg/24 hr  Place 0.1 mg onto the skin once a week.     divalproex 500 MG DR tablet  Commonly known as:  DEPAKOTE  Take 500 mg by mouth 2 (two) times daily.     ENSURE PLUS Liqd  Take 237 mLs by mouth daily. Drink one can with every meal     LORazepam 2 MG/ML concentrated solution  Commonly known as:  ATIVAN  Take 0.5 mLs (1 mg total) by mouth every 4 (four) hours as needed for anxiety, seizure, sedation or sleep (agitation).     Melatonin 1 MG Caps  Take 1 capsule by mouth daily. Takes at 7pm daily     morphine CONCENTRATE 10 mg / 0.5 ml concentrated solution  Take 0.5 mLs (10 mg total) by mouth every 3 (three) hours as needed for moderate pain, severe pain or shortness of breath.     phenytoin 125 MG/5ML suspension  Commonly known as:  DILANTIN  Take 8 mLs (200 mg total) by mouth daily.     QUEtiapine 25 MG tablet  Commonly known as:  SEROQUEL  Take 1 tablet (25 mg total) by mouth 2 (two) times daily.     traZODone 100 MG tablet  Commonly known as:  DESYREL  Take 200 mg by mouth at bedtime.     Valproic Acid 250 MG/5ML Syrp syrup  Commonly known as:  DEPAKENE  Take 10 mLs (500 mg total) by mouth 2 (two) times daily.          Diet and Activity recommendation: See Discharge Instructions above   Consults obtained - palliative care   Major procedures and Radiology Reports - PLEASE review detailed and final reports for all details, in brief -       Ct Head Wo  Contrast  03/13/2013   CLINICAL DATA:  Fall.  Altered mental status.  EXAM: CT HEAD WITHOUT CONTRAST  TECHNIQUE: Contiguous axial images were obtained from the base of the skull through the vertex without intravenous contrast.  COMPARISON:  Head CT scan 08/16/2012.  FINDINGS: This cortical atrophy and chronic microvascular ischemic change. 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 No evidence of acute intracranial abnormality including infarction, hemorrhage, mass lesion, mass effect, midline shift or abnormal extra-axial fluid collection. There is partial visualization of a mucous retention cyst or polyp in the right maxillary sinus.  IMPRESSION: No acute finding.  Stable compared to prior exam.   Electronically Signed   By: Drusilla Kanner M.D.   On: 03/13/2013 17:09   Dg Chest Port 1 View  04/03/2013   CLINICAL DATA:  73 year old male with cough. Initial encounter. Cerebral palsy.  EXAM: PORTABLE CHEST - 1 VIEW  COMPARISON:  04/01/2013 and earlier.  FINDINGS: Portable AP semi upright view at at 1311 hrs. Lung volumes are within normal limits. Normal cardiac size and mediastinal contours. Visualized tracheal air column is within normal limits. No pneumothorax, pleural effusion, pulmonary edema or consolidation. Decreased perihilar patchy opacity on the right, residual opacity most conspicuous along the minor fissure. Advanced degenerative changes left shoulder.  IMPRESSION: Decreased but not resolved patchy right lung opacity. Favor multilobar pneumonia in this setting.   Electronically Signed   By: Augusto Gamble M.D.   On: 04/03/2013 14:12   Portable Chest 1 View  04/01/2013   CLINICAL DATA:  Cough and congestion  EXAM: PORTABLE CHEST - 1 VIEW  COMPARISON:  DG CHEST 1V PORT dated 03/31/2013  FINDINGS: The heart size and mediastinal contours are within normal limits. The visualized skeletal structures are unremarkable. Cardiac leads obscure detail. Mild central vascular fullness persists without overt edema.  Probable crowding of the bronchovascular markings over the right lower lobe allowing for hypoaeration. Lungs are otherwise clear.  IMPRESSION: Probable crowding of bronchovascular markings over the right lower lobe although early pneumonia or atelectasis could appear similar. If symptoms persist, consider PA and lateral chest radiographs obtained  at full inspiration when the patient is clinically able.   Electronically Signed   By: Christiana Pellant M.D.   On: 04/01/2013 07:41   Dg Chest Portable 1 View  03/31/2013   CLINICAL DATA:  Unresponsive.  EXAM: PORTABLE CHEST - 1 VIEW  COMPARISON:  08/16/2012  FINDINGS: Rotation to left. Distortion of the mediastinal and cardiac silhouette.  Mild central pulmonary vascular prominence greater on the right.  Elevated right hemidiaphragm limiting evaluation of the right lung base.  No segmental consolidation.  Gas-filled bowel.  Left shoulder joint degenerative changes.  IMPRESSION: Rotated portable exam with mild central pulmonary vascular prominence slightly greater on the right.   Electronically Signed   By: Bridgett Larsson M.D.   On: 03/31/2013 13:58      History of present illness and  Hospital Course:     Kindly see H&P for history of present illness and admission details, please review complete Labs, Consult reports and Test reports for all details in brief Wesley Sullivan, is a 73 y.o. male, patient with history of  dementia, mental retard additional, seizure disorder, hypertension who was admitted with a hospital with chief complaints of decline in his mental status his workup as indicated above HCAP for which he was treated with appropriate antibiotics with good results, however patient has very poor baseline quality of life and long term prognosis due to his underlying comorbidities of dementia and mental retardation, due to his poor quality of life and prognosis palliative care consultation was made, palliative care had detailed discussion with patient's  power of attorney his brother Wesley Sullivan who now agreed that patient should be hospice status and goal of care should be comfort only.   At this point appropriate medications have been ordered with comfort being the primary goal, he will be discharged to nursing facility with hospice.         Today   Subjective:   Wesley Sullivan today remains nonverbal, appears to be in no distress,  Objective:   Blood pressure 111/71, pulse 75, temperature 97.8 F (36.6 C), temperature source Axillary, resp. rate 18, Sullivan 5\' 6"  (1.676 m), weight 50.5 kg (111 lb 5.3 oz), SpO2 100.00%.   Intake/Output Summary (Last 24 hours) at 04/11/13 1218 Last data filed at 04/11/13 4098  Gross per 24 hour  Intake    420 ml  Output      0 ml  Net    420 ml    Exam Lying in bed awake but unable to answer questions, No new F.N deficits all 4 extremities by self, Normal affect [place].AT,PERRAL Lungs: Clear to auscultation bilaterally RRR, no murmurs or rubs +ve B.Sounds, Abd Soft, Non tender, No organomegaly appriciated, No rebound -guarding or rigidity. No Cyanosis, Clubbing or edema, No new Rash or bruise  Data Review   CBC w Diff: Lab Results  Component Value Date   WBC 6.4 04/04/2013   HGB 11.8* 04/04/2013   HCT 35.2* 04/04/2013   PLT 84* 04/04/2013   LYMPHOPCT 5* 03/31/2013   MONOPCT 7 03/31/2013   EOSPCT 0 03/31/2013   BASOPCT 0 03/31/2013    CMP: Lab Results  Component Value Date   NA 140 04/04/2013   K 3.8 04/05/2013   CL 103 04/04/2013   CO2 28 04/04/2013   BUN 20 04/04/2013   CREATININE 0.62 04/04/2013   PROT 6.5 04/01/2013   ALBUMIN 2.2* 04/01/2013   BILITOT 0.7 04/01/2013   ALKPHOS 131* 04/01/2013   AST 68* 04/01/2013   ALT  34 04/01/2013  .   Total Time in preparing paper work, data evaluation and todays exam - 20 minutes  Hollice Espy M.D on 04/11/2013 at 12:18 PM  [facility] Office  [phone]

## 2013-04-11 NOTE — Progress Notes (Signed)
Patient was discharged to nursing skilled facility by MD order; discharged instructions  review and sent to the facility  with care notes and prescriptions; skin intact with scars on both lower legs; Patient's sister was informed about transfer via phone call;  [facility].

## 2013-04-11 NOTE — Clinical Social Work Note (Signed)
Ambulance transported requested for patient.  Roddie McBryant Ritu Gagliardo, [place] City[place]CSWA, [place]AS[place], [phone]

## 2013-04-11 NOTE — Clinical Social Work Placement (Signed)
Clinical Social Work Department CLINICAL SOCIAL WORK PLACEMENT NOTE 04/11/2013  Patient:  [name],Wesley L  Account Number:  [id number] Admit date:  03/31/2013  Clinical Social Worker:  Maryclare LabradorJULIE ANDERSON, Theresia MajorsLCSWA  Date/time:  04/03/2013 03:18 PM  Clinical Social Work is seeking post-discharge placement for this patient at the following level of care:   SKILLED NURSING   (*CSW will update this form in Epic as items are completed)     Patient/family provided with [facility] Department of Clinical Social Work's list of facilities offering this level of care within the geographic area requested by the patient (or if unable, by the patient's family).  04/03/2013  Patient/family informed of their freedom to choose among providers that offer the needed level of care, that participate in Medicare, Medicaid or managed care program needed by the patient, have an available bed and are willing to accept the patient.    Patient/family informed of [facility]' ownership interest in [facility], as well as of the fact that they are under no obligation to receive care at this facility.  PASARR submitted to EDS on 04/03/2013 PASARR number received from EDS on 04/03/2013  FL2 transmitted to all facilities in geographic area requested by pt/family on  04/03/2013 FL2 transmitted to all facilities within larger geographic area on   Patient informed that his/her managed care company has contracts with or will negotiate with  certain facilities, including the following:     Patient/family informed of bed offers received:  04/04/2013 [facility] Physician recommends and patient chooses bed at    [facility] on  04/11/2013 Patient to be transferred to facility by ambulance  The following physician request were entered in Epic:   Additional Comments: Per MD patient ready to [facility] with hosp[facility] servies.  Rn give number for report. RN, brother, and facility notified of DC. DC packet on chart. Ambulance transport requested for patient. CSW signing off.   Roddie McBryant Iline Buchinger, [place]CSWA, [place]ASA, [phone]

## 2013-04-13 ENCOUNTER — Non-Acute Institutional Stay (SKILLED_NURSING_FACILITY): Payer: Medicare Other | Admitting: Internal Medicine

## 2013-04-13 DIAGNOSIS — J189 Pneumonia, unspecified organism: Secondary | ICD-10-CM

## 2013-04-13 DIAGNOSIS — R404 Transient alteration of awareness: Secondary | ICD-10-CM

## 2013-04-13 DIAGNOSIS — F79 Unspecified intellectual disabilities: Secondary | ICD-10-CM

## 2013-04-13 DIAGNOSIS — E870 Hyperosmolality and hypernatremia: Secondary | ICD-10-CM

## 2013-04-13 NOTE — Progress Notes (Signed)
Patient ID: Wesley HeightRobert L Sullivan, male   DOB: August 23, 1940, 73 y.o.   MRN: [id number] [facility] Chief complaint stomach admission to SNF [facility] through 2/20 History; this is a 73 year old man who apparently has a history of mental retardation dementia and seizure disorder. He was admitted for confusion that started 3 days prior to admission. It states in the history that he had a caregiver but provides no other specific abnormality. He was found to have a sodium of 153 and was felt to have pneumonia. He received antibiotics. He received a palliative care consult and in discussion with his brother who is apparently his POA there was agreement that the patient should be hospice and comfort care only. He is suggestive to have hospice consultation  Past Medical History  Diagnosis Date  . Cerebral palsy   . Mental retardation   . Diabetes mellitus   . Dementia   . Seizures   . Gastroesophageal reflux disease   . Acute renal failure   . Constipation   . Speech impairment   . Hypertension   . Anemia    Current Outpatient Prescriptions on File Prior to Visit  Medication Sig Dispense Refill  . cloNIDine (CATAPRES - DOSED IN MG/24 HR) 0.1 mg/24hr patch Place 0.1 mg onto the skin once a week.      . divalproex (DEPAKOTE) 500 MG DR tablet Take 500 mg by mouth 2 (two) times daily.      Marland Kitchen. ENSURE PLUS (ENSURE PLUS) LIQD Take 237 mLs by mouth daily. Drink one can with every meal      . LORazepam (ATIVAN) 2 MG/ML concentrated solution Take 0.5 mLs (1 mg total) by mouth every 4 (four) hours as needed for anxiety, seizure, sedation or sleep (agitation).  30 mL  0  . Melatonin 1 MG CAPS Take 1 capsule by mouth daily. Takes at 7pm daily      . Morphine Sulfate (MORPHINE CONCENTRATE) 10 mg / 0.5 ml concentrated solution Take 0.5 mLs (10 mg total) by mouth every 3 (three) hours as needed for moderate pain, severe pain or shortness of breath.  30 mL  0  . phenytoin (DILANTIN) 125  MG/5ML suspension Take 8 mLs (200 mg total) by mouth daily.  237 mL  12  . QUEtiapine (SEROQUEL) 25 MG tablet Take 1 tablet (25 mg total) by mouth 2 (two) times daily.  60 tablet  0  . traZODone (DESYREL) 100 MG tablet Take 200 mg by mouth at bedtime.       . Valproic Acid (DEPAKENE) 250 MG/5ML SYRP syrup Take 10 mLs (500 mg total) by mouth 2 (two) times daily.  600 mL  3   No current facility-administered medications on file prior to visit.     social history; I don't see his precise living arrangements or his recent functional status. His brother is his power of attorney or responsible party. He comes with a signed DO NOT RESUSCITATE  Review of systems; really not possible  Physical examination Gen. frail man about not in any distress. He was difficult to wake up. It responded to some questions but was not cooperative with physical exam Respiratory shallow but otherwise clear entry Cardiac heart sounds are normal probably some degree of dehydration Abdomen no liver no spleen no tenderness Extremities; he appears to be able to move all his limbs. There is no edema. Some evidence of osteoarthritis  Impression/plan #1 by history acute delirium related predominantly to hypernatremia  and possible pneumonia. #2 very poor functional status listed as secondary to mental retardation and dementia #3 seizure disorder on Depakote and Dilantin. Last Valproic acid level I see is 78.3 number and the last Dilantin level I see on [place] [facility] is 1/22 at 15.8. These will need to be rechecked it. #4 hypernatremia #5 health care acquired pneumonia #6 dementia/mental retardation; I am not exactly sure of this man's functional status although he is listed as the client several days before admission. Apparently a hospice mediated approach to care was suggested in the hospital and agreed to by his brother. Last for a palliative care consult

## 2013-04-30 ENCOUNTER — Other Ambulatory Visit (HOSPITAL_COMMUNITY): Payer: Self-pay | Admitting: Internal Medicine

## 2013-04-30 DIAGNOSIS — R1310 Dysphagia, unspecified: Secondary | ICD-10-CM

## 2013-05-02 ENCOUNTER — Ambulatory Visit (HOSPITAL_COMMUNITY)
Admission: RE | Admit: 2013-05-02 | Discharge: 2013-05-02 | Disposition: A | Discharge status: Home / Self Care | Payer: No Typology Code available for payment source | Source: Ambulatory Visit | Attending: Internal Medicine | Admitting: Internal Medicine

## 2013-05-02 DIAGNOSIS — I10 Essential (primary) hypertension: Secondary | ICD-10-CM | POA: Insufficient documentation

## 2013-05-02 DIAGNOSIS — F79 Unspecified intellectual disabilities: Secondary | ICD-10-CM | POA: Diagnosis not present

## 2013-05-02 DIAGNOSIS — R1310 Dysphagia, unspecified: Secondary | ICD-10-CM | POA: Diagnosis present

## 2013-05-02 DIAGNOSIS — R1313 Dysphagia, pharyngeal phase: Secondary | ICD-10-CM | POA: Diagnosis not present

## 2013-05-02 DIAGNOSIS — R1311 Dysphagia, oral phase: Secondary | ICD-10-CM | POA: Insufficient documentation

## 2013-05-02 DIAGNOSIS — G809 Cerebral palsy, unspecified: Secondary | ICD-10-CM | POA: Insufficient documentation

## 2013-05-02 DIAGNOSIS — K219 Gastro-esophageal reflux disease without esophagitis: Secondary | ICD-10-CM | POA: Diagnosis not present

## 2013-05-02 DIAGNOSIS — E119 Type 2 diabetes mellitus without complications: Secondary | ICD-10-CM | POA: Insufficient documentation

## 2013-05-02 DIAGNOSIS — F0390 Unspecified dementia without behavioral disturbance: Secondary | ICD-10-CM | POA: Diagnosis not present

## 2013-05-02 NOTE — Procedures (Signed)
Objective Swallowing Evaluation: Modified Barium Swallowing Study  Patient Details  Name: Wesley Sullivan MRN: [id number] Date of Birth: September 29, 1940  Today's Date: 05/02/2013 Time: 4098-1191 SLP Time Calculation (min): 23 min  Past Medical History:  Past Medical History  Diagnosis Date  . Cerebral palsy   . Mental retardation   . Diabetes mellitus   . Dementia   . Seizures   . Gastroesophageal reflux disease   . Acute renal failure   . Constipation   . Speech impairment   . Hypertension   . Anemia    Past Surgical History: No past surgical history on file. HPI:  Wesley Sullivan is a 73 y.o. male with past medical history of mental retardation, dementia, seizure disorders and hypertension. Recent admission for pna.  Pt had fluctuating arousal. Due to history of dysphagia pt was on dys 1/honey thick liquids but family decided upon comfort care and diet with known risk. Today he arrives from SNF for OP MBS. Pt is still on honey thick liquids. Family present but cannot provide rationale for another MBS.  Pt has had up to 6 MBS prior.  Pt  was found to have a large cervical osteophyte with pressure effect on the oropharynx and nasopharynx. 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 This finding was discussed with his family and he was agreed that he was not a candidate for any surgical procedure.      Assessment / Plan / Recommendation Clinical Impression  Dysphagia Diagnosis: Moderate oral phase dysphagia;Moderate pharyngeal phase dysphagia Clinical impression: Pt presents with swallow function that is likely baseline due to cognitive and anatomical deficits. Orally pt has significant amount of residuals remaining post swalllow that fall to airway contributing to residue there. Swallow response is signficantly delayed with large boluses pooling in sulci. There  is decreased persitalsis and limited airway closure due to blockage of epiglottic deflection by large, diagnosed, osteophyte. Pt silently penetreated or aspirated all liquids either during or after the swallow. The least amount of penetration was with nectar thick liquids via cup sip with assist for small sips and cues for a throat clear and second swallow. Pt will be at risk with all textures and it as high risk for recurrent pna. Supposedly family had agreed on comfort measures at last visit. May need further discussion regarding pts plan of care given chronic dysphagia. Recommend Dys 3 (mech soft) diet with ground/pureed meat and nectar thick liquids with full supervision.     Treatment Recommendation  Defer treatment plan to SLP at (Comment)    Diet Recommendation Nectar-thick liquid;Dysphagia 3 (Mechanical Soft)   Liquid Administration via: Cup;No straw Medication Administration: Crushed with puree Supervision: Staff to assist with self feeding;Full supervision/cueing for compensatory strategies Compensations: Slow rate;Small sips/bites;Multiple dry swallows after each bite/sip;Clear throat intermittently Postural Changes and/or Swallow Maneuvers: Seated upright 90 degrees;Upright 30-60 min after meal    Other  Recommendations Oral Care Recommendations: Oral care BID   Follow Up Recommendations  Skilled Nursing facility;24 hour supervision/assistance    Frequency and Duration        Pertinent Vitals/Pain NA    SLP Swallow Goals     General HPI: Wesley Sullivan is a 73 y.o. male with past medical history of mental retardation, dementia, seizure disorders and hypertension. Recent admission for pna.  Pt had fluctuating arousal. Due to history of dysphagia pt was  on dys 1/honey thick liquids but family decided upon comfort care and diet with known risk. Today he arrives from SNF for OP MBS. Pt is still on honey thick liquids. Family present but cannot provide rationale for another MBS.   Pt has had up to 6 MBS prior.  Pt  was found to have a large cervical osteophyte with pressure effect on the oropharynx and nasopharynx. 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 This finding was discussed with his family and he was agreed that he was not a candidate for any surgical procedure.  Type of Study: Modified Barium Swallowing Study Reason for Referral: Objectively evaluate swallowing function Diet Prior to this Study: Dysphagia 1 (puree);Honey-thick liquids Temperature Spikes Noted: No Respiratory Status: Room air History of Recent Intubation: No Behavior/Cognition: Alert;Requires cueing Oral Cavity - Dentition: Poor condition;Missing dentition Oral Motor / Sensory Function: Within functional limits Self-Feeding Abilities: Able to feed self Patient Positioning: Upright in chair Baseline Vocal Quality: Other (comment);Clear Volitional Cough: Cognitively unable to elicit Volitional Swallow: Unable to elicit Anatomy:  (large cervical osteophyte from C3-C6) Pharyngeal Secretions: Not observed secondary MBS    Reason for Referral Objectively evaluate swallowing function   Oral Phase Oral Preparation/Oral Phase Oral Phase: Impaired Oral - Honey Oral - Honey Teaspoon: Weak lingual manipulation;Reduced posterior propulsion;Delayed oral transit;Lingual/palatal residue Oral - Honey Cup: Weak lingual manipulation;Reduced posterior propulsion;Delayed oral transit;Lingual/palatal residue Oral - Nectar Oral - Nectar Cup: Weak lingual manipulation;Reduced posterior propulsion;Delayed oral transit;Lingual/palatal residue Oral - Thin Oral - Thin Teaspoon: Weak lingual manipulation;Reduced posterior propulsion;Delayed oral transit;Lingual/palatal residue Oral - Thin Cup: Weak lingual manipulation;Reduced posterior propulsion;Delayed oral transit;Lingual/palatal  residue Oral - Solids Oral - Puree: Weak lingual manipulation;Reduced posterior propulsion;Delayed oral transit;Lingual/palatal residue Oral - Mechanical Soft: Weak lingual manipulation;Reduced posterior propulsion;Delayed oral transit;Lingual/palatal residue   Pharyngeal Phase Pharyngeal Phase Pharyngeal Phase: Impaired Pharyngeal - Honey Pharyngeal - Honey Teaspoon: Delayed swallow initiation;Premature spillage to pyriform sinuses;Reduced pharyngeal peristalsis;Reduced epiglottic inversion;Reduced anterior laryngeal mobility;Reduced laryngeal elevation;Reduced airway/laryngeal closure;Reduced tongue base retraction;Penetration/Aspiration during swallow;Penetration/Aspiration after swallow;Moderate aspiration;Pharyngeal residue - valleculae;Pharyngeal residue - pyriform sinuses Penetration/Aspiration details (honey teaspoon): Material enters airway, passes BELOW cords without attempt by patient to eject out (silent aspiration) Pharyngeal - Honey Cup: Delayed swallow initiation;Premature spillage to pyriform sinuses;Reduced pharyngeal peristalsis;Reduced epiglottic inversion;Reduced anterior laryngeal mobility;Reduced laryngeal elevation;Reduced airway/laryngeal closure;Reduced tongue base retraction;Penetration/Aspiration during swallow;Penetration/Aspiration after swallow;Moderate aspiration;Pharyngeal residue - valleculae;Pharyngeal residue - pyriform sinuses Penetration/Aspiration details (honey cup): Material enters airway, passes BELOW cords without attempt by patient to eject out (silent aspiration) Pharyngeal - Nectar Pharyngeal - Nectar Cup: Delayed swallow initiation;Premature spillage to pyriform sinuses;Reduced pharyngeal peristalsis;Reduced epiglottic inversion;Reduced anterior laryngeal mobility;Reduced laryngeal elevation;Reduced airway/laryngeal closure;Reduced tongue base retraction;Penetration/Aspiration during swallow;Penetration/Aspiration after swallow;Pharyngeal residue -  valleculae;Pharyngeal residue - pyriform sinuses;Trace aspiration Penetration/Aspiration details (nectar cup): Material enters airway, passes BELOW cords without attempt by patient to eject out (silent aspiration) Pharyngeal - Nectar Straw: Delayed swallow initiation;Premature spillage to pyriform sinuses;Reduced pharyngeal peristalsis;Reduced epiglottic inversion;Reduced anterior laryngeal mobility;Reduced laryngeal elevation;Reduced airway/laryngeal closure;Reduced tongue base retraction;Penetration/Aspiration during swallow;Penetration/Aspiration after swallow;Moderate aspiration;Pharyngeal residue - valleculae;Pharyngeal residue - pyriform sinuses Penetration/Aspiration details (nectar straw): Material enters airway, passes BELOW cords without attempt by patient to eject out (silent aspiration) Pharyngeal - Thin Pharyngeal - Thin Teaspoon: Delayed swallow initiation;Premature spillage to pyriform sinuses;Reduced pharyngeal peristalsis;Reduced epiglottic inversion;Reduced anterior laryngeal mobility;Reduced laryngeal elevation;Reduced airway/laryngeal closure;Reduced tongue base retraction;Penetration/Aspiration during swallow;Penetration/Aspiration after swallow;Moderate  aspiration;Pharyngeal residue - valleculae;Pharyngeal residue - pyriform sinuses Penetration/Aspiration details (thin teaspoon): Material enters airway, passes BELOW cords without attempt by patient to eject out (silent aspiration) Pharyngeal - Thin Cup: Delayed swallow initiation;Premature spillage to pyriform sinuses;Reduced pharyngeal peristalsis;Reduced epiglottic inversion;Reduced anterior laryngeal mobility;Reduced laryngeal elevation;Reduced airway/laryngeal closure;Reduced tongue base retraction;Penetration/Aspiration during swallow;Penetration/Aspiration after swallow;Moderate aspiration;Pharyngeal residue - valleculae;Pharyngeal residue - pyriform sinuses Penetration/Aspiration details (thin cup): Material enters airway, passes  BELOW cords without attempt by patient to eject out (silent aspiration) Pharyngeal - Solids Pharyngeal - Puree: Delayed swallow initiation;Reduced pharyngeal peristalsis;Reduced epiglottic inversion;Reduced anterior laryngeal mobility;Reduced laryngeal elevation;Reduced airway/laryngeal closure;Reduced tongue base retraction;Pharyngeal residue - valleculae;Pharyngeal residue - pyriform sinuses Penetration/Aspiration details (puree): Material does not enter airway Pharyngeal - Mechanical Soft: Delayed swallow initiation;Reduced pharyngeal peristalsis;Reduced epiglottic inversion;Reduced anterior laryngeal mobility;Reduced laryngeal elevation;Reduced airway/laryngeal closure;Reduced tongue base retraction;Pharyngeal residue - valleculae;Pharyngeal residue - pyriform sinuses Penetration/Aspiration details (mechanical soft): Material does not enter airway  Cervical Esophageal Phase    GO         Functional Assessment Tool Used: clinical judgement Functional Limitations: Swallowing Swallow Current Status (M8413): At least 60 percent but less than 80 percent impaired, limited or restricted Swallow Goal Status [phone]): At least 60 percent but less than 80 percent impaired, limited or restricted Swallow Discharge Status [phone]): At least 60 percent but less than 80 percent impaired, limited or restricted   [facility], [place] CCC-SLP [phone]  Claudine Mouton 05/02/2013, 1:43 PM

## 2013-05-08 ENCOUNTER — Non-Acute Institutional Stay (SKILLED_NURSING_FACILITY): Payer: Medicare Other | Admitting: Internal Medicine

## 2013-05-08 DIAGNOSIS — I10 Essential (primary) hypertension: Secondary | ICD-10-CM

## 2013-05-08 DIAGNOSIS — G40909 Epilepsy, unspecified, not intractable, without status epilepticus: Secondary | ICD-10-CM

## 2013-05-08 DIAGNOSIS — F0391 Unspecified dementia with behavioral disturbance: Secondary | ICD-10-CM

## 2013-05-08 DIAGNOSIS — F03918 Unspecified dementia, unspecified severity, with other behavioral disturbance: Secondary | ICD-10-CM

## 2013-05-08 DIAGNOSIS — G9340 Encephalopathy, unspecified: Secondary | ICD-10-CM

## 2013-05-08 DIAGNOSIS — G4700 Insomnia, unspecified: Secondary | ICD-10-CM

## 2013-05-08 NOTE — Progress Notes (Signed)
Patient ID: Wesley Sullivan, male   DOB: 05-09-1940, 73 y.o.   MRN: [id number]    [facility]  Chief Complaint  Patient presents with  . Medical Managment of Chronic Issues   No Known Allergies  Code status- DNR  HPI 74 y/o male patient is seen today for routine visit. Speech therapist is present in his room. He has been upgraded to nectar thick feeds and he is feeding himself. He is in no distress but does not communicate. No new concern from staff. Has had few falls reported in the facility  ROS Unable to obtain  Past Medical History  Diagnosis Date  . Cerebral palsy   . Mental retardation   . Diabetes mellitus   . Dementia   . Seizures   . Gastroesophageal reflux disease   . Acute renal failure   . Constipation   . Speech impairment   . Hypertension   . Anemia    No past surgical history on file. Current Outpatient Prescriptions on File Prior to Visit  Medication Sig Dispense Refill  . cloNIDine (CATAPRES - DOSED IN MG/24 HR) 0.1 mg/24hr patch Place 0.1 mg onto the skin once a week.      . divalproex (DEPAKOTE) 500 MG DR tablet Take 500 mg by mouth 2 (two) times daily.      Marland Kitchen ENSURE PLUS (ENSURE PLUS) LIQD Take 237 mLs by mouth daily. Drink one can with every meal      . LORazepam (ATIVAN) 2 MG/ML concentrated solution Take 0.5 mLs (1 mg total) by mouth every 4 (four) hours as needed for anxiety, seizure, sedation or sleep (agitation).  30 mL  0  . Melatonin 1 MG CAPS Take 1 capsule by mouth daily. Takes at 7pm daily      . phenytoin (DILANTIN) 125 MG/5ML suspension Take 8 mLs (200 mg total) by mouth daily.  237 mL  12  . QUEtiapine (SEROQUEL) 25 MG tablet Take 1 tablet (25 mg total) by mouth 2 (two) times daily.  60 tablet  0  . traZODone (DESYREL) 100 MG tablet Take 200 mg by mouth at bedtime.       . Valproic Acid (DEPAKENE) 250 MG/5ML SYRP syrup Take 10 mLs (500 mg total) by mouth 2 (two) times daily.  600 mL  3  . Morphine Sulfate (MORPHINE CONCENTRATE) 10 mg  / 0.5 ml concentrated solution Take 0.5 mLs (10 mg total) by mouth every 3 (three) hours as needed for moderate pain, severe pain or shortness of breath.  30 mL  0   No current facility-administered medications on file prior to visit.    Physical exam BP 100/60  Pulse 62  Temp(Src) 96.6 F (35.9 C)  Resp 18  Ht 5\' 10"  (1.778 m)  Wt 115 lb (52.164 kg)  BMI 16.50 kg/m2  General- elderly male in no acute distress, frail Head- atraumatic, normocephalic Eyes- PERRLA, EOMI, no pallor, no icterus Neck- no lymphadenopathy, no thyromegaly, no jugular vein distension, no carotid bruit Chest- no chest wall deformities, no chest wall tenderness Cardiovascular- normal s1,s2, no murmurs/ rubs/ gallops Respiratory- bilateral clear to auscultation, no wheeze, no rhonchi, no crackles Abdomen- bowel sounds present, soft, non tender Musculoskeletal- able to move all 4 extremities, no spinal and paraspinal tenderness, on wheelchair, no leg edema Neurological- unable to assess with pt not participating Psychiatry- has some confusion, normal mood and affect  Labs 04/23/13 ammonia 122 04/14/13 glu 64, bun 28, cr 0.78, na 150, k 5.3, depakote  level 38, dilantin 7.7  Assessment/plan 73 y/o male patient is here with dementia and seizure disorder. He is seen by palliative care services. He has confusion and is high aspiration risk  Dysphagia- at high aspiration risk, continue nectar thick feed for now. Will continue aspiration precautions  Seizure- remains seizure free. Low depakote and dilantin level on 04/14/13 with repeat lab pending. Recheck lab. Continue dilantin 200 mg daily with depakote 500 mg bid  Dementia with behavior disturbance- persists, continue trazodone and seroquel with prn ativan  Encephalopathy- has subtherapeutic anti seizure medication level and elevated ammonia level on last draw. Will recheck all these levels and adjust medication further. Fall precautions, aspiration precautions for  now. Continue seroquel 25 mg bid  Hypertension- stable. Continue weekly clonidine patch. Monitor bp  Insomnia- continue trazodone with melatonin  Anxiety- stable this visit. Continue prn ativan  Hypernatremia- recheck bmp, encourage hydration  Wesley GroutMAHIMA Atalya Dano, MD  [facility] [phone] (Monday-Friday 8 am - 5 pm) [phone] (afterhours)

## 2013-06-20 ENCOUNTER — Ambulatory Visit: Payer: Medicare Other | Admitting: Neurology

## 2013-06-30 ENCOUNTER — Non-Acute Institutional Stay (SKILLED_NURSING_FACILITY): Payer: Medicare Other | Admitting: Internal Medicine

## 2013-06-30 DIAGNOSIS — G40909 Epilepsy, unspecified, not intractable, without status epilepticus: Secondary | ICD-10-CM

## 2013-06-30 DIAGNOSIS — K5900 Constipation, unspecified: Secondary | ICD-10-CM | POA: Insufficient documentation

## 2013-06-30 DIAGNOSIS — I10 Essential (primary) hypertension: Secondary | ICD-10-CM

## 2013-06-30 DIAGNOSIS — G4700 Insomnia, unspecified: Secondary | ICD-10-CM

## 2013-06-30 NOTE — Progress Notes (Signed)
         PROGRESS NOTE  DATE: 06/30/2013  FACILITY: Nursing Home Location: [facility]  LEVEL OF CARE: SNF (31)  Routine Visit  CHIEF COMPLAINT:  Manage seizure disorder, hypertension and insomnia  HISTORY OF PRESENT ILLNESS:  REASSESSMENT OF ONGOING PROBLEM(S):  HTN: Pt 's HTN remains stable.  Staff Deny CP, sob, DOE, pedal edema, headaches, dizziness or visual disturbances.  No complications from the medications currently being used.  Last BP : 129/82. Patient is a poor historian  SEIZURE DISORDER: The patient's seizure disorder remains stable. No complications reported from the medications presently being used. Staff do not report any recent seizure activity.  INSOMNIA: The insomnia remains stable.  No complications noted from the medications presently being used. Staff deny ongoing insomnia, pain, hallucinations, delusions.  PAST MEDICAL HISTORY : Reviewed.  No changes/see problem list  CURRENT MEDICATIONS: Reviewed per MAR/see medication list  REVIEW OF SYSTEMS: Unobtainable due to mental retardation  PHYSICAL EXAMINATION  VS:  See VS section  GENERAL: no acute distress, normal body habitus EYES: conjunctivae normal, sclerae normal, normal eye lids NECK: supple, trachea midline, no neck masses, no thyroid tenderness, no thyromegaly LYMPHATICS: no LAN in the neck, no supraclavicular LAN RESPIRATORY: breathing is even & unlabored, BS CTAB CARDIAC: RRR, no murmur,no extra heart sounds, no edema GI: abdomen soft, normal BS, no masses, no tenderness, no hepatomegaly, no splenomegaly PSYCHIATRIC: the patient is alert & oriented to person, affect & behavior appropriate  LABS/RADIOLOGY:  3-15 CBC normal, albumin 3.3 otherwise CMP normal, Depakote level 23, Dilantin level 4  ASSESSMENT/PLAN:  Seizure disorder-well controlled Insomnia-continue trazodone Hypertension-good control Constipation-continue laxatives Diabetes mellitus-check hemoglobin  A1c Dementia-check TSH and vitamin B12 level Chronic pain-patient appears comfortable Cerebral palsy-continue supportive care  CPT CODE: 4098199309  Angela CoxGayani Y Odean Fester, MD [facility] [phone]

## 2013-07-15 ENCOUNTER — Other Ambulatory Visit: Payer: Self-pay | Admitting: *Deleted

## 2013-07-15 MED ORDER — LORAZEPAM 2 MG/ML PO CONC: 1.0000 mg | ORAL | Status: AC | PRN

## 2013-07-15 NOTE — Telephone Encounter (Signed)
[facility] 

## 2013-07-23 ENCOUNTER — Non-Acute Institutional Stay (SKILLED_NURSING_FACILITY): Payer: Medicare Other | Admitting: Internal Medicine

## 2013-07-23 DIAGNOSIS — G40909 Epilepsy, unspecified, not intractable, without status epilepticus: Secondary | ICD-10-CM

## 2013-07-23 DIAGNOSIS — I10 Essential (primary) hypertension: Secondary | ICD-10-CM

## 2013-07-23 DIAGNOSIS — K5900 Constipation, unspecified: Secondary | ICD-10-CM

## 2013-07-23 DIAGNOSIS — G4700 Insomnia, unspecified: Secondary | ICD-10-CM

## 2013-07-23 NOTE — Progress Notes (Signed)
                PROGRESS NOTE  DATE: 07-23-13  FACILITY: Nursing Home Location: [facility]  LEVEL OF CARE: SNF (31)  Routine Visit  CHIEF COMPLAINT:  Manage seizure disorder, hypertension and insomnia  HISTORY OF PRESENT ILLNESS:  REASSESSMENT OF ONGOING PROBLEM(S):  HTN: Pt 's HTN remains stable.  Staff Deny CP, sob, DOE, pedal edema, headaches, dizziness or visual disturbances.  No complications from the medications currently being used.  Last BP : 129/82, 125/81. Patient is a poor historian  SEIZURE DISORDER: The patient's seizure disorder remains stable. No complications reported from the medications presently being used. Staff do not report any recent seizure activity.  INSOMNIA: The insomnia remains stable.  No complications noted from the medications presently being used. Staff deny ongoing insomnia, pain, hallucinations, delusions.  PAST MEDICAL HISTORY : Reviewed.  No changes/see problem list  CURRENT MEDICATIONS: Reviewed per MAR/see medication list  REVIEW OF SYSTEMS: Unobtainable due to mental retardation  PHYSICAL EXAMINATION  VS:  See VS section  GENERAL: no acute distress, normal body habitus NECK: supple, trachea midline, no neck masses, no thyroid tenderness, no thyromegaly RESPIRATORY: breathing is even & unlabored, BS CTAB CARDIAC: RRR, no murmur,no extra heart sounds, no edema GI: abdomen soft, normal BS, no masses, no tenderness, no hepatomegaly, no splenomegaly PSYCHIATRIC: the patient is alert & oriented to person, affect & behavior appropriate  LABS/RADIOLOGY: 5-15 vitamin B12 level 567, folate 11.8, hemoglobin A1c 5.3, TSH 1.19 3-15 CBC normal, albumin 3.3 otherwise CMP normal, Depakote level 23, Dilantin level 4  ASSESSMENT/PLAN:  Seizure disorder-well controlled Insomnia-continue trazodone Hypertension-good control Constipation-continue laxatives Diabetes mellitus-well controlled Dementia-stable Chronic pain-patient  appears comfortable Cerebral palsy-continue supportive care  CPT CODE: 62035  Angela Cox, MD [facility] [phone]

## 2013-07-30 ENCOUNTER — Non-Acute Institutional Stay (SKILLED_NURSING_FACILITY): Payer: Medicare Other | Admitting: Internal Medicine

## 2013-07-30 DIAGNOSIS — G40909 Epilepsy, unspecified, not intractable, without status epilepticus: Secondary | ICD-10-CM

## 2013-07-30 DIAGNOSIS — R748 Abnormal levels of other serum enzymes: Secondary | ICD-10-CM

## 2013-08-04 NOTE — Progress Notes (Signed)
Patient ID: Wesley HeightRobert L Sullivan, male   DOB: December 20, 1940, 73 y.o.   MRN: [id number]            PROGRESS NOTE  DATE: 07/30/2013       [facility]  LEVEL OF CARE: SNF (31)  Acute Visit  CHIEF COMPLAINT:  Manage seizure disorder and elevated ammonia level.    HISTORY OF PRESENT ILLNESS: I was requested by the staff to assess the patient regarding above problem(s):  SEIZURE DISORDER:   I was requested by the pharmacy consultant to assess the patient for dose increase of Dilantin and Depakote due to nontherapeutic levels.  Patient is a poor historian.  Staff also report that patient refuses to take medications.   The patient's seizure disorder remains stable. No complications reported from the medications presently being used. Staff do not report any recent seizure activity.    ELEVATED AMMONIA LEVEL:  Pharmacy consultant reports that patient is receiving lactulose due to elevated ammonia level.  Patient does not have a history of cirrhosis.    PAST MEDICAL HISTORY : Reviewed.  No changes/see problem list  CURRENT MEDICATIONS: Reviewed per MAR/see medication list  REVIEW OF SYSTEMS:  Unobtainable.  Patient is a poor historian.       PHYSICAL EXAMINATION  VS:  T 96.1       P 62      RR 20      BP 125/81     POX 91%       WT (Lb) 120         GENERAL: no acute distress, normal body habitus NECK: supple, trachea midline, no neck masses, no thyroid tenderness, no thyromegaly RESPIRATORY: breathing is even & unlabored, BS CTAB CARDIAC: RRR, no murmur,no extra heart sounds, no edema GI: abdomen soft, normal BS, no masses, no tenderness, no hepatomegaly, no splenomegaly PSYCHIATRIC: the patient is alert, unable to assess orientation, affect & behavior appropriate  LABS/RADIOLOGY: 04/2013:  Depakote level 23, Dilantin level 4.    ASSESSMENT/PLAN:  Seizure disorder.  Stable.  The patient has not had seizure activity.  Subtherapeutic Dilantin and Depakote levels  are likely secondary to refusal to take medications.  Since patient has been stable, we will not increase the dose.    Elevated ammonia level.  Patient has no history of cirrhosis or hepatic encephalopathy.  Therefore, discontinue lactulose and ammonia level checks.    CPT CODE: [id number]        Angela CoxGayani Y Dasanayaka, MD [facility] [phone]

## 2013-08-25 ENCOUNTER — Non-Acute Institutional Stay (SKILLED_NURSING_FACILITY): Payer: Medicare Other | Admitting: Internal Medicine

## 2013-08-25 DIAGNOSIS — G4700 Insomnia, unspecified: Secondary | ICD-10-CM

## 2013-08-25 DIAGNOSIS — G40909 Epilepsy, unspecified, not intractable, without status epilepticus: Secondary | ICD-10-CM

## 2013-08-25 DIAGNOSIS — K5900 Constipation, unspecified: Secondary | ICD-10-CM

## 2013-08-25 DIAGNOSIS — I10 Essential (primary) hypertension: Secondary | ICD-10-CM

## 2013-08-26 NOTE — Progress Notes (Signed)
         PROGRESS NOTE  DATE: 08-25-13  FACILITY: Nursing Home [facility]  LEVEL OF CARE: SNF (31)  Routine Visit  CHIEF COMPLAINT:  Manage seizure disorder, hypertension and insomnia  HISTORY OF PRESENT ILLNESS:  REASSESSMENT OF ONGOING PROBLEM(S):  HTN: Pt 's HTN remains stable.  Staff Deny CP, sob, DOE, pedal edema, headaches, dizziness or visual disturbances.  No complications from the medications currently being used.  Last BP : 129/82, 125/81, 128/78. Patient is a poor historian  SEIZURE DISORDER: The patient's seizure disorder remains stable. No complications reported from the medications presently being used. Staff do not report any recent seizure activity.  INSOMNIA: The insomnia remains stable.  No complications noted from the medications presently being used. Staff deny ongoing insomnia, pain, hallucinations, delusions.  PAST MEDICAL HISTORY : Reviewed.  No changes/see problem list  CURRENT MEDICATIONS: Reviewed per MAR/see medication list  REVIEW OF SYSTEMS: Unobtainable due to mental retardation  PHYSICAL EXAMINATION  VS:  See VS section  GENERAL: no acute distress, normal body habitus NECK: supple, trachea midline, no neck masses, no thyroid tenderness, no thyromegaly RESPIRATORY: breathing is even & unlabored, BS CTAB CARDIAC: RRR, no murmur,no extra heart sounds, no edema GI: abdomen soft, normal BS, no masses, no tenderness, no hepatomegaly, no splenomegaly PSYCHIATRIC: the patient is alert & oriented to person, affect & behavior appropriate  LABS/RADIOLOGY: 5-15 vitamin B12 level 567, folate 11.8, hemoglobin A1c 5.3, TSH 1.19 3-15 CBC normal, albumin 3.3 otherwise CMP normal, Depakote level 23, Dilantin level 4  ASSESSMENT/PLAN:  Seizure disorder-well controlled Insomnia-continue trazodone Hypertension-well controlled Constipation-continue laxatives Diabetes mellitus-well controlled Dementia-stable. Seroquel was  increased. Chronic pain-patient appears comfortable Cerebral palsy-continue supportive care  CPT CODE: 1610999308  Angela CoxGayani Y Armya Westerhoff, MD [facility] [phone]

## 2013-08-28 ENCOUNTER — Non-Acute Institutional Stay (SKILLED_NURSING_FACILITY): Payer: Medicare Other | Admitting: Internal Medicine

## 2013-08-28 DIAGNOSIS — R451 Restlessness and agitation: Secondary | ICD-10-CM

## 2013-08-28 DIAGNOSIS — IMO0002 Reserved for concepts with insufficient information to code with codable children: Secondary | ICD-10-CM

## 2013-08-29 ENCOUNTER — Other Ambulatory Visit: Payer: Self-pay | Admitting: *Deleted

## 2013-08-29 MED ORDER — AMBULATORY NON FORMULARY MEDICATION: Status: AC

## 2013-08-29 NOTE — Telephone Encounter (Signed)
[facility] 

## 2013-09-04 NOTE — Progress Notes (Signed)
Patient ID: Wesley Sullivan, male   DOB: 1940-02-22, 73 y.o.   MRN: [id number]           PROGRESS NOTE  DATE: 08/28/2013          [facility]  LEVEL OF CARE: SNF (31)  Acute Visit  CHIEF COMPLAINT:  Manage agitation.    HISTORY OF PRESENT ILLNESS: I was requested by the staff to assess the patient regarding above problem(s):  Staff report that patient is verbally abusive towards staff.  He curses, and he took the breakfast plate down the hall and slammed on the nursing station, stating he is not eating the food.  The staff are unable to redirect the resident or care for him.   Patient is a poor historian.    PAST MEDICAL HISTORY : Reviewed.  No changes/see problem list  CURRENT MEDICATIONS: Reviewed per MAR/see medication list  REVIEW OF SYSTEMS:  Unobtainable.  Patient is a poor historian.      PHYSICAL EXAMINATION  VS:  T 96.8       P 70      RR 20      BP 112/80            GENERAL: no acute distress, normal body habitus NECK: supple, trachea midline, no neck masses, no thyroid tenderness, no thyromegaly RESPIRATORY: breathing is even & unlabored, BS CTAB CARDIAC: RRR, no murmur,no extra heart sounds, no edema GI: abdomen soft, normal BS, no masses, no tenderness, no hepatomegaly, no splenomegaly PSYCHIATRIC: the patient is alert, unable to assess orientation, he is restless         ASSESSMENT/PLAN:  Agitation.  Uncontrolled problem.  He is currently on Depakote, Seroquel, and trazodone, on high doses.  Two days ago he was started on Klonopin 0.25 mg b.i.d., but staff are unable to give the medication p.o.  Therefore, discontinue the Klonopin and start lorazepam gel 0.5 mg b.i.d.    CPT CODE: [id number]           Angela CoxGayani Y Kobe Ofallon, MD [facility] [phone]

## 2013-10-08 ENCOUNTER — Non-Acute Institutional Stay (SKILLED_NURSING_FACILITY): Payer: Medicare Other | Admitting: Internal Medicine

## 2013-10-08 DIAGNOSIS — I10 Essential (primary) hypertension: Secondary | ICD-10-CM

## 2013-10-08 DIAGNOSIS — G40909 Epilepsy, unspecified, not intractable, without status epilepticus: Secondary | ICD-10-CM

## 2013-10-08 DIAGNOSIS — G4700 Insomnia, unspecified: Secondary | ICD-10-CM

## 2013-10-08 DIAGNOSIS — E119 Type 2 diabetes mellitus without complications: Secondary | ICD-10-CM

## 2013-10-10 NOTE — Progress Notes (Signed)
         PROGRESS NOTE  DATE: 10-08-13  FACILITY: Nursing Home [facility]  LEVEL OF CARE: SNF (31)  Routine Visit  CHIEF COMPLAINT:  Manage seizure disorder, hypertension and insomnia  HISTORY OF PRESENT ILLNESS:  REASSESSMENT OF ONGOING PROBLEM(S):  HTN: Pt 's HTN remains stable.  Staff Deny CP, sob, DOE, pedal edema, headaches, dizziness or visual disturbances.  No complications from the medications currently being used.  Last BP : 129/82, 125/81, 132/68. Patient is a poor historian  SEIZURE DISORDER: The patient's seizure disorder remains stable. No complications reported from the medications presently being used. Staff do not report any recent seizure activity.  INSOMNIA: The insomnia remains stable.  No complications noted from the medications presently being used. Staff deny ongoing insomnia, pain, hallucinations, delusions.  PAST MEDICAL HISTORY : Reviewed.  No changes/see problem list  CURRENT MEDICATIONS: Reviewed per MAR/see medication list  REVIEW OF SYSTEMS: Unobtainable due to mental retardation  PHYSICAL EXAMINATION  VS:  See VS section  GENERAL: no acute distress, normal body habitus NECK: supple, trachea midline, no neck masses, no thyroid tenderness, no thyromegaly RESPIRATORY: breathing is even & unlabored, BS CTAB CARDIAC: RRR, no murmur,no extra heart sounds, no edema GI: abdomen soft, normal BS, no masses, no tenderness, no hepatomegaly, no splenomegaly PSYCHIATRIC: the patient is alert & oriented to person, affect & behavior appropriate  LABS/RADIOLOGY: 5-15 vitamin B12 level 567, folate 11.8, hemoglobin A1c 5.3, TSH 1.19 3-15 CBC normal, albumin 3.3 otherwise CMP normal, Depakote level 23, Dilantin level 4  ASSESSMENT/PLAN:  Seizure disorder-well controlled Insomnia-continue trazodone Hypertension-good control Diabetes mellitus-well controlled Dementia-stable Chronic pain-patient appears comfortable Cerebral  palsy-continue supportive care Check CBC and CMP  CPT CODE: 1610999308  Angela CoxGayani Y Dasanayaka, MD [facility] [phone]

## 2013-11-05 ENCOUNTER — Non-Acute Institutional Stay (SKILLED_NURSING_FACILITY): Payer: Medicare Other | Admitting: Internal Medicine

## 2013-11-05 DIAGNOSIS — G4700 Insomnia, unspecified: Secondary | ICD-10-CM

## 2013-11-05 DIAGNOSIS — G40909 Epilepsy, unspecified, not intractable, without status epilepticus: Secondary | ICD-10-CM

## 2013-11-05 DIAGNOSIS — I10 Essential (primary) hypertension: Secondary | ICD-10-CM

## 2013-11-05 DIAGNOSIS — E119 Type 2 diabetes mellitus without complications: Secondary | ICD-10-CM

## 2013-11-06 NOTE — Progress Notes (Signed)
         PROGRESS NOTE  DATE: 11-05-13  FACILITY: Nursing Home Location: [facility]  LEVEL OF CARE: SNF (31)  Routine Visit  CHIEF COMPLAINT:  Manage seizure disorder, hypertension and insomnia  HISTORY OF PRESENT ILLNESS:  REASSESSMENT OF ONGOING PROBLEM(S):  HTN: Pt 's HTN remains stable.  Staff Deny CP, sob, DOE, pedal edema, headaches, dizziness or visual disturbances.  No complications from the medications currently being used.  Last BP : 129/82, 125/81, 132/68, 120/75. Patient is a poor historian  SEIZURE DISORDER: The patient's seizure disorder remains stable. No complications reported from the medications presently being used. Staff do not report any recent seizure activity.  INSOMNIA: The insomnia remains stable.  No complications noted from the medications presently being used. Staff deny ongoing insomnia, pain, hallucinations, delusions.  PAST MEDICAL HISTORY : Reviewed.  No changes/see problem list  CURRENT MEDICATIONS: Reviewed per MAR/see medication list  REVIEW OF SYSTEMS: Unobtainable due to mental retardation  PHYSICAL EXAMINATION  VS:  See VS section  GENERAL: no acute distress, normal body habitus NECK: supple, trachea midline, no neck masses, no thyroid tenderness, no thyromegaly RESPIRATORY: breathing is even & unlabored, BS CTAB CARDIAC: RRR, no murmur,no extra heart sounds, no edema GI: abdomen soft, normal BS, no masses, no tenderness, no hepatomegaly, no splenomegaly PSYCHIATRIC: the patient is alert & oriented to person, affect & behavior appropriate  LABS/RADIOLOGY: 8-15 CMP normal, CBC normal 5-15 vitamin B12 level 567, folate 11.8, hemoglobin A1c 5.3, TSH 1.19 3-15 CBC normal, albumin 3.3 otherwise CMP normal, Depakote level 23, Dilantin level 4  ASSESSMENT/PLAN:  Seizure disorder-well controlled Insomnia-continue trazodone Hypertension-good control Diabetes mellitus-well controlled Dementia-stable Chronic  pain-patient appears comfortable Cerebral palsy-continue supportive care  CPT CODE: 60454  Angela Cox, MD [facility] [phone]

## 2013-11-24 ENCOUNTER — Non-Acute Institutional Stay (SKILLED_NURSING_FACILITY): Payer: Medicare Other | Admitting: Internal Medicine

## 2013-11-24 DIAGNOSIS — G40909 Epilepsy, unspecified, not intractable, without status epilepticus: Secondary | ICD-10-CM

## 2013-11-24 DIAGNOSIS — E119 Type 2 diabetes mellitus without complications: Secondary | ICD-10-CM

## 2013-11-24 DIAGNOSIS — I10 Essential (primary) hypertension: Secondary | ICD-10-CM

## 2013-11-24 DIAGNOSIS — G4700 Insomnia, unspecified: Secondary | ICD-10-CM

## 2013-11-25 DIAGNOSIS — E119 Type 2 diabetes mellitus without complications: Secondary | ICD-10-CM | POA: Insufficient documentation

## 2013-11-25 NOTE — Progress Notes (Signed)
         PROGRESS NOTE  DATE: 11-24-13  FACILITY: Nursing Home [facility]  LEVEL OF CARE: SNF (31)  Routine Visit  CHIEF COMPLAINT:  Manage seizure disorder, hypertension and insomnia  HISTORY OF PRESENT ILLNESS:  REASSESSMENT OF ONGOING PROBLEM(S):  HTN: Pt 's HTN remains stable.  Staff Deny CP, sob, DOE, pedal edema, headaches, dizziness or visual disturbances.  No complications from the medications currently being used.  Last BP : 129/82, 125/81, 132/68, 120/75, 100/87. Patient is a poor historian  SEIZURE DISORDER: The patient's seizure disorder remains stable. No complications reported from the medications presently being used. Staff do not report any recent seizure activity.  INSOMNIA: The insomnia remains stable.  No complications noted from the medications presently being used. Staff deny ongoing insomnia, pain, hallucinations, delusions.  PAST MEDICAL HISTORY : Reviewed.  No changes/see problem list  CURRENT MEDICATIONS: Reviewed per MAR/see medication list  REVIEW OF SYSTEMS: Unobtainable due to mental retardation  PHYSICAL EXAMINATION  VS:  See VS section  GENERAL: no acute distress, normal body habitus NECK: supple, trachea midline, no neck masses, no thyroid tenderness, no thyromegaly RESPIRATORY: breathing is even & unlabored, BS CTAB CARDIAC: RRR, no murmur,no extra heart sounds, no edema GI: abdomen soft, normal BS, no masses, no tenderness, no hepatomegaly, no splenomegaly PSYCHIATRIC: the patient is alert & oriented to person, affect & behavior appropriate  LABS/RADIOLOGY: 9-15 depakote level 53, albumin 4, dilantin level <0.6 8-15 CMP normal, CBC normal 5-15 vitamin B12 level 567, folate 11.8, hemoglobin A1c 5.3, TSH 1.19 3-15 CBC normal, albumin 3.3 otherwise CMP normal, Depakote level 23, Dilantin level 4  ASSESSMENT/PLAN:  Seizure disorder-well controlled Insomnia-continue trazodone Hypertension-good  control Diabetes mellitus-well controlled Dementia-stable Chronic pain-patient appears comfortable Cerebral palsy-continue supportive care  CPT CODE: [id number]  Angela CoxGayani Y George Alcantar, MD [facility] [phone]

## 2013-12-01 ENCOUNTER — Non-Acute Institutional Stay (SKILLED_NURSING_FACILITY): Payer: Medicare Other | Admitting: Internal Medicine

## 2013-12-01 DIAGNOSIS — Z5181 Encounter for therapeutic drug level monitoring: Secondary | ICD-10-CM

## 2013-12-01 DIAGNOSIS — G40909 Epilepsy, unspecified, not intractable, without status epilepticus: Secondary | ICD-10-CM

## 2013-12-05 NOTE — Progress Notes (Signed)
Patient ID: Wesley HeightRobert L Sullivan, male   DOB: February 05, 1941, 73 y.o.   MRN: [id number]           PROGRESS NOTE  DATE: 12/01/2013         [facility]  LEVEL OF CARE: SNF (31)  Acute Visit  CHIEF COMPLAINT:  Manage seizure disorder.    HISTORY OF PRESENT ILLNESS: I was requested by the staff to assess the patient regarding above problem(s):  SEIZURE DISORDER:  I was requested by the pharmacy consultant to assess the patient due too subtherapeutic Dilantin levels.  The patient's seizure disorder remains stable. No complications reported from the medications presently being used. Staff do not report any recent seizure activity.   The patient overall is a poor historian.    PAST MEDICAL HISTORY : Reviewed.  No changes/see problem list  CURRENT MEDICATIONS: Reviewed per MAR/see medication list  REVIEW OF SYSTEMS:  Unobtainable.  Patient is a poor historian.      PHYSICAL EXAMINATION  VS: see VS section  GENERAL: no acute distress, normal body habitus NECK: supple, trachea midline, no neck masses, no thyroid tenderness, no thyromegaly RESPIRATORY: breathing is even & unlabored, BS CTAB CARDIAC: RRR, no murmur,no extra heart sounds, no edema GI: abdomen soft, normal BS, no masses, no tenderness, no hepatomegaly, no splenomegaly PSYCHIATRIC: the patient is alert, unable to assess orientation, affect & behavior appropriate  LABS/RADIOLOGY:    On 11/03/2013:  Dilantin level 0.6.    On 05/16/2013:  Dilantin level 4.2.    On 05/10/2103:  Dilantin level 4.    ASSESSMENT/PLAN:  Seizure disorder.  Dilantin level is subtherapeutic.  Therefore, we will increase the Dilantin to 400 mg b.i.d.    V58.69.  Check Dilantin level in two weeks.    CPT CODE: [id number]                Angela CoxGayani Y Neiko Trivedi, MD [facility] [phone]

## 2014-01-05 ENCOUNTER — Telehealth: Payer: Self-pay | Admitting: Neurology

## 2014-01-05 ENCOUNTER — Encounter: Payer: Self-pay | Admitting: Neurology

## 2014-01-05 NOTE — Telephone Encounter (Signed)
Left message for patient regarding rescheduling 02/02/14 appointment per [name] leaving, called patient's main contact number but the nursing facility states that he does not live there anymore and they do not know an alternate contact number, so I called patient's emergency contact [name] and left message.

## 2014-02-02 ENCOUNTER — Ambulatory Visit: Payer: Self-pay | Admitting: Nurse Practitioner

## 2014-08-17 ENCOUNTER — Other Ambulatory Visit: Payer: Self-pay

## 2017-06-22 ENCOUNTER — Emergency Department (HOSPITAL_COMMUNITY)
Admission: EM | Admit: 2017-06-22 | Discharge: 2017-06-22 | Disposition: A | Discharge status: Home / Self Care | Payer: Medicare Other | Attending: Emergency Medicine | Admitting: Emergency Medicine

## 2017-06-22 ENCOUNTER — Emergency Department (HOSPITAL_COMMUNITY): Payer: Medicare Other

## 2017-06-22 ENCOUNTER — Other Ambulatory Visit: Payer: Self-pay

## 2017-06-22 ENCOUNTER — Encounter (HOSPITAL_COMMUNITY): Payer: Self-pay | Admitting: Emergency Medicine

## 2017-06-22 DIAGNOSIS — Y999 Unspecified external cause status: Secondary | ICD-10-CM | POA: Insufficient documentation

## 2017-06-22 DIAGNOSIS — E119 Type 2 diabetes mellitus without complications: Secondary | ICD-10-CM | POA: Diagnosis not present

## 2017-06-22 DIAGNOSIS — Z79899 Other long term (current) drug therapy: Secondary | ICD-10-CM | POA: Diagnosis not present

## 2017-06-22 DIAGNOSIS — X58XXXA Exposure to other specified factors, initial encounter: Secondary | ICD-10-CM | POA: Insufficient documentation

## 2017-06-22 DIAGNOSIS — Y939 Activity, unspecified: Secondary | ICD-10-CM | POA: Diagnosis not present

## 2017-06-22 DIAGNOSIS — S0181XA Laceration without foreign body of other part of head, initial encounter: Secondary | ICD-10-CM | POA: Insufficient documentation

## 2017-06-22 DIAGNOSIS — Y92129 Unspecified place in nursing home as the place of occurrence of the external cause: Secondary | ICD-10-CM | POA: Insufficient documentation

## 2017-06-22 DIAGNOSIS — R7889 Finding of other specified substances, not normally found in blood: Secondary | ICD-10-CM

## 2017-06-22 DIAGNOSIS — T148XXA Other injury of unspecified body region, initial encounter: Secondary | ICD-10-CM

## 2017-06-22 DIAGNOSIS — I10 Essential (primary) hypertension: Secondary | ICD-10-CM | POA: Insufficient documentation

## 2017-06-22 DIAGNOSIS — Z23 Encounter for immunization: Secondary | ICD-10-CM | POA: Insufficient documentation

## 2017-06-22 DIAGNOSIS — S0990XA Unspecified injury of head, initial encounter: Secondary | ICD-10-CM | POA: Diagnosis present

## 2017-06-22 LAB — CBC WITH DIFFERENTIAL/PLATELET
Basophils Absolute: 0 10*3/uL (ref 0.0–0.1)
Basophils Relative: 0 %
EOS PCT: 0 %
Eosinophils Absolute: 0 10*3/uL (ref 0.0–0.7)
HCT: 30.5 % — ABNORMAL LOW (ref 39.0–52.0)
Hemoglobin: 10 g/dL — ABNORMAL LOW (ref 13.0–17.0)
LYMPHS ABS: 1.9 10*3/uL (ref 0.7–4.0)
Lymphocytes Relative: 23 %
MCH: 31 pg (ref 26.0–34.0)
MCHC: 32.8 g/dL (ref 30.0–36.0)
MCV: 94.4 fL (ref 78.0–100.0)
MONO ABS: 0.6 10*3/uL (ref 0.1–1.0)
MONOS PCT: 7 %
Neutro Abs: 5.7 10*3/uL (ref 1.7–7.7)
Neutrophils Relative %: 70 %
PLATELETS: 293 10*3/uL (ref 150–400)
RBC: 3.23 MIL/uL — AB (ref 4.22–5.81)
RDW: 11.9 % (ref 11.5–15.5)
WBC: 8.1 10*3/uL (ref 4.0–10.5)

## 2017-06-22 LAB — COMPREHENSIVE METABOLIC PANEL
ALT: 17 U/L (ref 17–63)
AST: 18 U/L (ref 15–41)
Albumin: 3.4 g/dL — ABNORMAL LOW (ref 3.5–5.0)
Alkaline Phosphatase: 71 U/L (ref 38–126)
Anion gap: 9 (ref 5–15)
BUN: 18 mg/dL (ref 6–20)
CHLORIDE: 105 mmol/L (ref 101–111)
CO2: 28 mmol/L (ref 22–32)
CREATININE: 0.63 mg/dL (ref 0.61–1.24)
Calcium: 8.6 mg/dL — ABNORMAL LOW (ref 8.9–10.3)
GFR calc Af Amer: >60 mL/min (ref >60)
Glucose, Bld: 94 mg/dL (ref 65–99)
Potassium: 4.2 mmol/L (ref 3.5–5.1)
Sodium: 142 mmol/L (ref 135–145)
Total Bilirubin: 0.9 mg/dL (ref 0.3–1.2)
Total Protein: 6.1 g/dL — ABNORMAL LOW (ref 6.5–8.1)

## 2017-06-22 LAB — PHENYTOIN LEVEL, TOTAL: Phenytoin Lvl: 30.3 ug/mL (ref 10.0–20.0)

## 2017-06-22 LAB — VALPROIC ACID LEVEL: Valproic Acid Lvl: 19 ug/mL — ABNORMAL LOW (ref 50.0–100.0)

## 2017-06-22 MED ORDER — TETANUS-DIPHTH-ACELL PERTUSSIS 5-2.5-18.5 LF-MCG/0.5 IM SUSP
0.5000 mL | Freq: Once | INTRAMUSCULAR | Status: AC
  Administered 2017-06-22: 0.5 mL via INTRAMUSCULAR
  Filled 2017-06-22: qty 0.5

## 2017-06-22 NOTE — ED Provider Notes (Signed)
[facility]-EMERGENCY DEPT Provider Note   CSN: [id number] Arrival date & time: 06/22/17  0857     History   Chief Complaint Chief Complaint  Patient presents with  . Fall    HPI Wesley Sullivan is a 77 y.o. male.  HPI Level 5 caveat for cognitive delay.  Pt comes in with cc of fall. Pt has hx of falls. He resides at a facility, and was found to be slumped out of wheelchair.  Patient was also noted to have bleeding from the right side of his forehead.  Patient has history of seizures and the nursing home is unsure whether patient had a blunt trauma due to seizure that caused the bleeding or if he struck something while awake and that caused the bleeding. Patient is not on any blood thinners. Pt is not walking at baseline. Hx was provided by NP who manages the patients care  I called the legal guardian at [phone] - the call goes to voice mail.   Past Medical History:  Diagnosis Date  . Acute renal failure (HCC)   . Anemia   . Cerebral palsy (HCC)   . Constipation   . Dementia   . Diabetes mellitus   . Gastroesophageal reflux disease   . Hypertension   . Mental retardation   . Seizures (HCC)   . Speech impairment     Patient Active Problem List   Diagnosis Date Noted  . Diabetes (HCC) 11/25/2013  . Unspecified constipation 06/30/2013  . Sepsis (HCC) 03/31/2013  . Hypernatremia 03/31/2013  . Leukocytosis 03/31/2013  . Toxic encephalopathy 03/31/2013  . Severe protein-calorie malnutrition (HCC) 03/31/2013  . Septic shock (HCC) 03/31/2013  . Altered mental status 03/20/2013  . Encounter for long-term (current) use of other medications 02/03/2013  . HTN (hypertension) 02/24/2011  . Psychosis (HCC) 02/16/2011  . DM 06/10/2007  . ANEMIA 06/10/2007  . THROMBOCYTOPENIA 06/10/2007  . ALCOHOL ABUSE 06/10/2007  . MENTAL RETARDATION 06/10/2007  . 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 SEIZURE DISORDER 06/10/2007    History reviewed. No  pertinent surgical history.      Home Medications    Prior to Admission medications   Medication Sig Start Date End Date Taking? Authorizing Provider  Cholecalciferol (VITAMIN D3) 2000 units TABS Take 2,000 Units by mouth daily.   Yes [provider]  cloNIDine (CATAPRES - DOSED IN MG/24 HR) 0.1 mg/24hr patch Place 0.1 mg onto the skin once a week.   Yes [provider]  divalproex (DEPAKOTE SPRINKLE) 125 MG capsule Take 750 mg by mouth 2 (two) times daily.   Yes [provider]  lisinopril (PRINIVIL,ZESTRIL) 5 MG tablet Take 5 mg by mouth daily.   Yes [provider]  mirtazapine (REMERON) 7.5 MG tablet Take 7.5 mg by mouth daily.   Yes [provider]  phenytoin (DILANTIN) 300 MG ER capsule Take 300 mg by mouth 3 (three) times daily.   Yes [provider]  traZODone (DESYREL) 150 MG tablet Take 150 mg by mouth at bedtime.   Yes [provider]  AMBULATORY NON FORMULARY MEDICATION Lorazepam 0.5mg /ml Gel Sig: Apply 1ml twice daily Patient not taking: Reported on 06/22/2017 08/29/13   Reed, Tiffany L, DO  divalproex (DEPAKOTE) 500 MG DR tablet Take 500 mg by mouth 2 (two) times daily. 03/13/13   Susy Frizzle, MD  LORazepam (ATIVAN) 2 MG/ML concentrated solution Take 0.5 mLs (1 mg total) by mouth every 4 (four) hours as needed for anxiety, seizure, sedation  or sleep (agitation). Patient not taking: Reported on 06/22/2017 07/15/13   Oneal Grout, MD  Melatonin 1 MG CAPS Take 1 capsule by mouth daily. Takes at 7pm daily    [provider]  phenytoin (DILANTIN) 125 MG/5ML suspension Take 8 mLs (200 mg total) by mouth daily. Patient not taking: Reported on 06/22/2017 04/11/13   Hollice Espy, MD  QUEtiapine (SEROQUEL) 25 MG tablet Take 1 tablet (25 mg total) by mouth 2 (two) times daily. Patient not taking: Reported on 06/22/2017 04/11/13   Hollice Espy, MD  traZODone (DESYREL) 100 MG tablet Take 200 mg by mouth at  bedtime.     [provider]  Valproic Acid (DEPAKENE) 250 MG/5ML SYRP syrup Take 10 mLs (500 mg total) by mouth 2 (two) times daily. Patient not taking: Reported on 06/22/2017 04/11/13   Hollice Espy, MD    Family History History reviewed. No pertinent family history.  Social History Social History   Tobacco Use  . Smoking status: Never Smoker  . Smokeless tobacco: Never Used  Substance Use Topics  . Alcohol use: No  . Drug use: No     Allergies   Patient has no known allergies.   Review of Systems Review of Systems  Unable to perform ROS: Patient nonverbal     Physical Exam Updated Vital Signs BP 110/71   Pulse 68   Temp 97.7 F (36.5 C) (Oral)   Resp (!) 21   SpO2 100%   Physical Exam  Constitutional: He is oriented to person, place, and time. He appears well-developed.  HENT:  Head: Atraumatic.  Neck: Neck supple.  Cardiovascular: Normal rate.  Pulmonary/Chest: Effort normal.  Neurological: He is alert and oriented to person, place, and time.  Skin: Skin is warm.  Nursing note and vitals reviewed.    ED Treatments / Results  Labs (all labs ordered are listed, but only abnormal results are displayed) Labs Reviewed  COMPREHENSIVE METABOLIC PANEL - Abnormal; Notable for the following components:      Result Value   Calcium 8.6 (*)    Total Protein 6.1 (*)    Albumin 3.4 (*)    All other components within normal limits  CBC WITH DIFFERENTIAL/PLATELET - Abnormal; Notable for the following components:   RBC 3.23 (*)    Hemoglobin 10.0 (*)    HCT 30.5 (*)    All other components within normal limits  VALPROIC ACID LEVEL - Abnormal; Notable for the following components:   Valproic Acid Lvl 19 (*)    All other components within normal limits  PHENYTOIN LEVEL, TOTAL - Abnormal; Notable for the following components:   Phenytoin Lvl 30.3 (*)    All other components within normal limits  URINALYSIS, ROUTINE W REFLEX MICROSCOPIC     EKG None  Radiology Ct Head Wo Contrast  Result Date: 06/22/2017 CLINICAL DATA:  77 year old male with history of fall from wheelchair with hematoma above the right eye. EXAM: CT HEAD WITHOUT CONTRAST TECHNIQUE: Contiguous axial images were obtained from the base of the skull through the vertex without intravenous contrast. COMPARISON:  Head CT 03/13/2013. FINDINGS: Brain: Mild cerebral atrophy. Patchy and confluent areas of decreased attenuation are noted throughout the deep and periventricular white matter of the cerebral hemispheres bilaterally, compatible with chronic microvascular ischemic disease. More focal area of low attenuation and cortical atrophy in the right frontal lobe, compatible with an area of encephalomalacia/gliosis at site of prior infarct. No evidence of acute infarction, hemorrhage, hydrocephalus, extra-axial  collection or mass lesion/mass effect. Vascular: No hyperdense vessel or unexpected calcification. Skull: Normal. Negative for fracture or focal lesion. Sinuses/Orbits: Small air-fluid levels in maxillary sinuses bilaterally (right greater than left). Other: Small amount of periorbital soft tissue swelling on the right side. IMPRESSION: 1. Small amount of periorbital soft tissue swelling on the right side corresponding to the reported hematoma. No underlying displaced skull fracture or signs of significant acute intracranial trauma. 2. Mild cerebral atrophy with chronic microvascular ischemic changes and old right frontal lobe infarct, similar to prior study, as detailed above. 3. Small air-fluid levels in the maxillary sinuses bilaterally may suggest an acute sinusitis. Electronically Signed   By: Trudie Reed M.D.   On: 06/22/2017 11:52    Procedures .Marland KitchenLaceration Repair Date/Time: 06/22/2017 1:28 PM Performed by: Derwood Kaplan, MD Authorized by: Derwood Kaplan, MD   Consent:    Consent obtained:  Verbal (with pt's NP, as the guardian didnt pick up the call)    Consent given by:  Healthcare agent   Risks discussed:  Infection, poor cosmetic result, poor wound healing, pain and need for additional repair Universal protocol:    Imaging studies available: yes     Site/side marked: yes     Immediately prior to procedure, a time out was called: yes     Patient identity confirmed:  Arm band Anesthesia (see MAR for exact dosages):    Anesthesia method:  None Laceration details:    Location:  Face   Face location:  Forehead   Length (cm):  3   Depth (mm):  5 Repair type:    Repair type:  Simple Pre-procedure details:    Preparation:  Patient was prepped and draped in usual sterile fashion Exploration:    Wound exploration: wound explored through full range of motion and entire depth of wound probed and visualized     Wound extent: fascia violated     Contaminated: no   Treatment:    Area cleansed with:  Saline   Amount of cleaning:  Standard   Irrigation solution:  Sterile saline   Irrigation volume:  10   Irrigation method:  Syringe   Visualized foreign bodies/material removed: no   Skin repair:    Repair method:  Steri-Strips   Number of Steri-Strips:  3 Approximation:    Approximation:  Close Post-procedure details:    Dressing:  Adhesive bandage   Patient tolerance of procedure:  Tolerated well, no immediate complications Comments:     dermabond utilized   (including critical care time)  Medications Ordered in ED Medications  Tdap (BOOSTRIX) injection 0.5 mL (has no administration in time range)     Initial Impression / Assessment and Plan / ED Course  I have reviewed the triage vital signs and the nursing notes.  Pertinent labs & imaging results that were available during my care of the patient were reviewed by me and considered in my medical decision making (see chart for details).     Pt comes in with cc of bleeding. Underlying cause could be seizure or fall-  The facility is not sure. Pt is wheel chair bound.  We  will apply dermabond. CT head is neg. Will d/c.  Patient's Dilantin level is slightly elevated.  I spoke with the nurse practitioner, and discussed the results.  She will adjust the Dilantin.  With the Dilantin elevation I do not see any evidence of nystagmus. I dont see any evidence of chronic Dilantin toxicity.  Patient is not ambulatory at  baseline which is reassuring.  Final Clinical Impressions(s) / ED Diagnoses   Final diagnoses:  Elevated Dilantin level  Hematoma    ED Discharge Orders    None       Derwood Kaplan, MD 06/22/17 1332

## 2017-06-22 NOTE — ED Triage Notes (Signed)
Per [facility], pt from [facility] with unwitnessed fall from wheelchair with hematoma above right eye. Was not found on floor, but still in wheel chair. Denies pain. Hx of intellectual disability. 104/60BP 70 HR 98% RA. Pt on aspirin. No LOC noted.

## 2017-06-22 NOTE — ED Notes (Signed)
Bed: WA10 Expected date:  Expected time:  Means of arrival:  Comments: EMS fall 

## 2017-06-22 NOTE — ED Notes (Signed)
Date and time results received: 06/22/17 12:55 PM    Test: dilantin Critical Value: 30.3  Name of Provider Notified: Nanavati  Orders Received? Or Actions Taken?: MD Rhunette Croft and RN Jon Gills made aware

## 2018-09-14 ENCOUNTER — Emergency Department (HOSPITAL_COMMUNITY): Payer: Medicare Other

## 2018-09-14 ENCOUNTER — Inpatient Hospital Stay (HOSPITAL_COMMUNITY)
Admission: EM | Admit: 2018-09-14 | Discharge: 2018-10-22 | DRG: 871 | Disposition: E | Payer: Medicare Other | Attending: Internal Medicine | Admitting: Internal Medicine

## 2018-09-14 ENCOUNTER — Encounter (HOSPITAL_COMMUNITY): Payer: Self-pay | Admitting: *Deleted

## 2018-09-14 DIAGNOSIS — J9601 Acute respiratory failure with hypoxia: Secondary | ICD-10-CM | POA: Diagnosis present

## 2018-09-14 DIAGNOSIS — J690 Pneumonitis due to inhalation of food and vomit: Secondary | ICD-10-CM | POA: Diagnosis not present

## 2018-09-14 DIAGNOSIS — L89520 Pressure ulcer of left ankle, unstageable: Secondary | ICD-10-CM | POA: Diagnosis present

## 2018-09-14 DIAGNOSIS — R64 Cachexia: Secondary | ICD-10-CM | POA: Diagnosis present

## 2018-09-14 DIAGNOSIS — G809 Cerebral palsy, unspecified: Secondary | ICD-10-CM | POA: Diagnosis present

## 2018-09-14 DIAGNOSIS — G804 Ataxic cerebral palsy: Secondary | ICD-10-CM | POA: Diagnosis not present

## 2018-09-14 DIAGNOSIS — Z681 Body mass index (BMI) 19 or less, adult: Secondary | ICD-10-CM

## 2018-09-14 DIAGNOSIS — G800 Spastic quadriplegic cerebral palsy: Secondary | ICD-10-CM | POA: Diagnosis not present

## 2018-09-14 DIAGNOSIS — U071 COVID-19: Secondary | ICD-10-CM | POA: Diagnosis present

## 2018-09-14 DIAGNOSIS — G9341 Metabolic encephalopathy: Secondary | ICD-10-CM | POA: Diagnosis present

## 2018-09-14 DIAGNOSIS — F0391 Unspecified dementia with behavioral disturbance: Secondary | ICD-10-CM | POA: Diagnosis present

## 2018-09-14 DIAGNOSIS — R680 Hypothermia, not associated with low environmental temperature: Secondary | ICD-10-CM | POA: Diagnosis present

## 2018-09-14 DIAGNOSIS — G40909 Epilepsy, unspecified, not intractable, without status epilepticus: Secondary | ICD-10-CM | POA: Diagnosis present

## 2018-09-14 DIAGNOSIS — Z66 Do not resuscitate: Secondary | ICD-10-CM | POA: Diagnosis present

## 2018-09-14 DIAGNOSIS — J189 Pneumonia, unspecified organism: Secondary | ICD-10-CM

## 2018-09-14 DIAGNOSIS — D638 Anemia in other chronic diseases classified elsewhere: Secondary | ICD-10-CM | POA: Diagnosis present

## 2018-09-14 DIAGNOSIS — D649 Anemia, unspecified: Secondary | ICD-10-CM | POA: Diagnosis not present

## 2018-09-14 DIAGNOSIS — A419 Sepsis, unspecified organism: Secondary | ICD-10-CM | POA: Diagnosis not present

## 2018-09-14 DIAGNOSIS — R479 Unspecified speech disturbances: Secondary | ICD-10-CM | POA: Diagnosis present

## 2018-09-14 DIAGNOSIS — R401 Stupor: Secondary | ICD-10-CM

## 2018-09-14 DIAGNOSIS — T68XXXA Hypothermia, initial encounter: Secondary | ICD-10-CM | POA: Diagnosis present

## 2018-09-14 DIAGNOSIS — R1310 Dysphagia, unspecified: Secondary | ICD-10-CM | POA: Diagnosis present

## 2018-09-14 DIAGNOSIS — F79 Unspecified intellectual disabilities: Secondary | ICD-10-CM | POA: Diagnosis present

## 2018-09-14 DIAGNOSIS — R6520 Severe sepsis without septic shock: Secondary | ICD-10-CM | POA: Diagnosis present

## 2018-09-14 DIAGNOSIS — R4182 Altered mental status, unspecified: Secondary | ICD-10-CM | POA: Diagnosis not present

## 2018-09-14 DIAGNOSIS — K219 Gastro-esophageal reflux disease without esophagitis: Secondary | ICD-10-CM | POA: Diagnosis present

## 2018-09-14 DIAGNOSIS — F03918 Unspecified dementia, unspecified severity, with other behavioral disturbance: Secondary | ICD-10-CM | POA: Diagnosis present

## 2018-09-14 DIAGNOSIS — Z515 Encounter for palliative care: Secondary | ICD-10-CM | POA: Diagnosis not present

## 2018-09-14 DIAGNOSIS — E119 Type 2 diabetes mellitus without complications: Secondary | ICD-10-CM | POA: Diagnosis present

## 2018-09-14 DIAGNOSIS — Z781 Physical restraint status: Secondary | ICD-10-CM

## 2018-09-14 DIAGNOSIS — R636 Underweight: Secondary | ICD-10-CM | POA: Diagnosis present

## 2018-09-14 DIAGNOSIS — R4189 Other symptoms and signs involving cognitive functions and awareness: Secondary | ICD-10-CM | POA: Diagnosis not present

## 2018-09-14 DIAGNOSIS — I10 Essential (primary) hypertension: Secondary | ICD-10-CM | POA: Diagnosis present

## 2018-09-14 DIAGNOSIS — A4189 Other specified sepsis: Secondary | ICD-10-CM | POA: Diagnosis not present

## 2018-09-14 DIAGNOSIS — T68XXXD Hypothermia, subsequent encounter: Secondary | ICD-10-CM | POA: Diagnosis not present

## 2018-09-14 DIAGNOSIS — L8990 Pressure ulcer of unspecified site, unspecified stage: Secondary | ICD-10-CM | POA: Diagnosis present

## 2018-09-14 DIAGNOSIS — Z79899 Other long term (current) drug therapy: Secondary | ICD-10-CM

## 2018-09-14 LAB — COMPREHENSIVE METABOLIC PANEL
ALT: 18 U/L (ref 0–44)
AST: 25 U/L (ref 15–41)
Albumin: 3.1 g/dL — ABNORMAL LOW (ref 3.5–5.0)
Alkaline Phosphatase: 117 U/L (ref 38–126)
Anion gap: 11 (ref 5–15)
BUN: 23 mg/dL (ref 8–23)
CO2: 26 mmol/L (ref 22–32)
Calcium: 9.3 mg/dL (ref 8.9–10.3)
Chloride: 104 mmol/L (ref 98–111)
Creatinine, Ser: 0.93 mg/dL (ref 0.61–1.24)
GFR calc Af Amer: >60 mL/min (ref >60)
GFR calc non Af Amer: >60 mL/min (ref >60)
Glucose, Bld: 108 mg/dL — ABNORMAL HIGH (ref 70–99)
Potassium: 5 mmol/L (ref 3.5–5.1)
Sodium: 141 mmol/L (ref 135–145)
Total Bilirubin: 0.3 mg/dL (ref 0.3–1.2)
Total Protein: 6.5 g/dL (ref 6.5–8.1)

## 2018-09-14 LAB — URINALYSIS, ROUTINE W REFLEX MICROSCOPIC
Bilirubin Urine: NEGATIVE
Glucose, UA: NEGATIVE mg/dL
Hgb urine dipstick: NEGATIVE
Ketones, ur: NEGATIVE mg/dL
Leukocytes,Ua: NEGATIVE
Nitrite: NEGATIVE
Protein, ur: NEGATIVE mg/dL
Specific Gravity, Urine: 1.018 (ref 1.005–1.030)
pH: 6 (ref 5.0–8.0)

## 2018-09-14 LAB — PROCALCITONIN: Procalcitonin: 6.76 ng/mL

## 2018-09-14 LAB — CBC WITH DIFFERENTIAL/PLATELET
Abs Immature Granulocytes: 0.14 10*3/uL — ABNORMAL HIGH (ref 0.00–0.07)
Basophils Absolute: 0.1 10*3/uL (ref 0.0–0.1)
Basophils Relative: 0 %
Eosinophils Absolute: 0 10*3/uL (ref 0.0–0.5)
Eosinophils Relative: 0 %
HCT: 30 % — ABNORMAL LOW (ref 39.0–52.0)
Hemoglobin: 9.4 g/dL — ABNORMAL LOW (ref 13.0–17.0)
Immature Granulocytes: 1 %
Lymphocytes Relative: 5 %
Lymphs Abs: 1.3 10*3/uL (ref 0.7–4.0)
MCH: 30.7 pg (ref 26.0–34.0)
MCHC: 31.3 g/dL (ref 30.0–36.0)
MCV: 98 fL (ref 80.0–100.0)
Monocytes Absolute: 1.3 10*3/uL — ABNORMAL HIGH (ref 0.1–1.0)
Monocytes Relative: 5 %
Neutro Abs: 23.6 10*3/uL — ABNORMAL HIGH (ref 1.7–7.7)
Neutrophils Relative %: 89 %
Platelets: 210 10*3/uL (ref 150–400)
RBC: 3.06 MIL/uL — ABNORMAL LOW (ref 4.22–5.81)
RDW: 13.8 % (ref 11.5–15.5)
WBC: 26.5 10*3/uL — ABNORMAL HIGH (ref 4.0–10.5)
nRBC: 0 % (ref 0.0–0.2)

## 2018-09-14 LAB — VALPROIC ACID LEVEL: Valproic Acid Lvl: 48 ug/mL — ABNORMAL LOW (ref 50.0–100.0)

## 2018-09-14 LAB — LACTIC ACID, PLASMA
Lactic Acid, Venous: 2 mmol/L (ref 0.5–1.9)
Lactic Acid, Venous: 1.3 mmol/L (ref 0.5–1.9)

## 2018-09-14 LAB — SARS CORONAVIRUS 2 BY RT PCR (HOSPITAL ORDER, PERFORMED IN ~~LOC~~ HOSPITAL LAB): SARS Coronavirus 2: POSITIVE — AB

## 2018-09-14 LAB — PROTIME-INR
INR: 1.1 (ref 0.8–1.2)
Prothrombin Time: 13.9 seconds (ref 11.4–15.2)

## 2018-09-14 LAB — PHENYTOIN LEVEL, TOTAL: Phenytoin Lvl: 11.4 ug/mL (ref 10.0–20.0)

## 2018-09-14 LAB — AMMONIA: Ammonia: 49 umol/L — ABNORMAL HIGH (ref 9–35)

## 2018-09-14 LAB — APTT: aPTT: 68 seconds — ABNORMAL HIGH (ref 24–36)

## 2018-09-14 LAB — TSH: TSH: 2.019 u[IU]/mL (ref 0.350–4.500)

## 2018-09-14 MED ORDER — LORAZEPAM 2 MG/ML IJ SOLN
0.5000 mg | Freq: Once | INTRAMUSCULAR | Status: AC
  Administered 2018-09-15: 0.5 mg via INTRAVENOUS
  Filled 2018-09-14: qty 1

## 2018-09-14 MED ORDER — SORBITOL 70 % SOLN: 30.0000 mL | Freq: Every day | Status: DC | PRN

## 2018-09-14 MED ORDER — VITAMIN D3 25 MCG (1000 UNIT) PO TABS
2000.0000 [IU] | ORAL_TABLET | Freq: Every day | ORAL | Status: DC
  Filled 2018-09-14 (×8): qty 2

## 2018-09-14 MED ORDER — RIVASTIGMINE 4.6 MG/24HR TD PT24
4.6000 mg | MEDICATED_PATCH | Freq: Every day | TRANSDERMAL | Status: DC
  Administered 2018-09-15 – 2018-09-20 (×6): 4.6 mg via TRANSDERMAL
  Filled 2018-09-14 (×6): qty 1

## 2018-09-14 MED ORDER — LORAZEPAM 0.5 MG PO TABS: 2.0000 mg | ORAL_TABLET | Freq: Two times a day (BID) | ORAL | Status: DC | PRN

## 2018-09-14 MED ORDER — SENNOSIDES-DOCUSATE SODIUM 8.6-50 MG PO TABS: 1.0000 | ORAL_TABLET | Freq: Every evening | ORAL | Status: DC | PRN

## 2018-09-14 MED ORDER — SODIUM CHLORIDE 0.9 % IV SOLN
2.0000 g | INTRAVENOUS | Status: DC
  Administered 2018-09-14: 2 g via INTRAVENOUS
  Filled 2018-09-14: qty 20

## 2018-09-14 MED ORDER — SODIUM CHLORIDE 0.9 % IV BOLUS: 1000.0000 mL | Freq: Once | INTRAVENOUS | Status: DC

## 2018-09-14 MED ORDER — ONDANSETRON HCL 4 MG/2ML IJ SOLN: 4.0000 mg | Freq: Four times a day (QID) | INTRAMUSCULAR | Status: DC | PRN

## 2018-09-14 MED ORDER — LACTATED RINGERS IV SOLN
INTRAVENOUS | Status: DC
  Administered 2018-09-14 – 2018-09-18 (×3): via INTRAVENOUS

## 2018-09-14 MED ORDER — SODIUM CHLORIDE 0.9 % IV BOLUS (SEPSIS)
1000.0000 mL | Freq: Once | INTRAVENOUS | Status: AC
  Administered 2018-09-14: 1000 mL via INTRAVENOUS

## 2018-09-14 MED ORDER — PHENYTOIN SODIUM EXTENDED 100 MG PO CAPS
200.0000 mg | ORAL_CAPSULE | Freq: Three times a day (TID) | ORAL | Status: DC
  Filled 2018-09-14 (×3): qty 2

## 2018-09-14 MED ORDER — SODIUM CHLORIDE 0.9 % IV SOLN
500.0000 mg | INTRAVENOUS | Status: DC
  Administered 2018-09-14 – 2018-09-17 (×4): 500 mg via INTRAVENOUS
  Filled 2018-09-14 (×4): qty 500

## 2018-09-14 MED ORDER — HYDROCORTISONE NA SUCCINATE PF 100 MG IJ SOLR
50.0000 mg | Freq: Four times a day (QID) | INTRAMUSCULAR | Status: DC
  Administered 2018-09-14: 50 mg via INTRAVENOUS
  Filled 2018-09-14: qty 2

## 2018-09-14 MED ORDER — VANCOMYCIN HCL 10 G IV SOLR: 1250.0000 mg | INTRAVENOUS | Status: DC

## 2018-09-14 MED ORDER — SODIUM CHLORIDE 0.9 % IV SOLN
200.0000 mg | Freq: Three times a day (TID) | INTRAVENOUS | Status: DC
  Administered 2018-09-15: 02:00:00 200 mg via INTRAVENOUS
  Filled 2018-09-14 (×2): qty 4

## 2018-09-14 MED ORDER — VALPROATE SODIUM 500 MG/5ML IV SOLN
750.0000 mg | Freq: Two times a day (BID) | INTRAVENOUS | Status: DC
  Administered 2018-09-15 – 2018-09-20 (×12): 750 mg via INTRAVENOUS
  Filled 2018-09-14 (×12): qty 7.5

## 2018-09-14 MED ORDER — FLEET ENEMA 7-19 GM/118ML RE ENEM: 1.0000 | ENEMA | Freq: Once | RECTAL | Status: DC | PRN

## 2018-09-14 MED ORDER — VANCOMYCIN HCL 10 G IV SOLR
1500.0000 mg | Freq: Once | INTRAVENOUS | Status: AC
  Administered 2018-09-15: 1500 mg via INTRAVENOUS
  Filled 2018-09-14: qty 1500

## 2018-09-14 MED ORDER — LEVETIRACETAM IN NACL 500 MG/100ML IV SOLN
500.0000 mg | Freq: Two times a day (BID) | INTRAVENOUS | Status: DC
  Administered 2018-09-15 – 2018-09-20 (×12): 500 mg via INTRAVENOUS
  Filled 2018-09-14 (×14): qty 100

## 2018-09-14 MED ORDER — ONDANSETRON HCL 4 MG PO TABS: 4.0000 mg | ORAL_TABLET | Freq: Four times a day (QID) | ORAL | Status: DC | PRN

## 2018-09-14 MED ORDER — SODIUM CHLORIDE 0.9 % IV SOLN
2.0000 g | Freq: Once | INTRAVENOUS | Status: AC
  Administered 2018-09-14: 2 g via INTRAVENOUS
  Filled 2018-09-14: qty 2

## 2018-09-14 MED ORDER — SODIUM CHLORIDE 0.9 % IV SOLN: 2.0000 g | Freq: Three times a day (TID) | INTRAVENOUS | Status: DC

## 2018-09-14 MED ORDER — IVERMECTIN 3 MG PO TABS
9.0000 mg | ORAL_TABLET | Freq: Once | ORAL | Status: DC
  Filled 2018-09-14: qty 3

## 2018-09-14 MED ORDER — ENOXAPARIN SODIUM 40 MG/0.4ML ~~LOC~~ SOLN
40.0000 mg | Freq: Two times a day (BID) | SUBCUTANEOUS | Status: DC
  Administered 2018-09-15: 40 mg via SUBCUTANEOUS
  Filled 2018-09-14: qty 0.4

## 2018-09-14 MED ORDER — METHYLPREDNISOLONE SODIUM SUCC 125 MG IJ SOLR
60.0000 mg | Freq: Every day | INTRAMUSCULAR | Status: DC
  Administered 2018-09-15 – 2018-09-18 (×5): 60 mg via INTRAVENOUS
  Filled 2018-09-14 (×6): qty 2

## 2018-09-14 MED ORDER — METHYLPREDNISOLONE SODIUM SUCC 125 MG IJ SOLR: 60.0000 mg | Freq: Four times a day (QID) | INTRAMUSCULAR | Status: DC

## 2018-09-14 MED ORDER — ENOXAPARIN SODIUM 40 MG/0.4ML ~~LOC~~ SOLN: 40.0000 mg | SUBCUTANEOUS | Status: DC

## 2018-09-14 MED ORDER — IVERMECTIN 3 MG PO TABS: 9.0000 mg | ORAL_TABLET | Freq: Once | ORAL | Status: DC

## 2018-09-14 MED ORDER — VANCOMYCIN HCL IN DEXTROSE 1-5 GM/200ML-% IV SOLN: 1000.0000 mg | Freq: Once | INTRAVENOUS | Status: DC

## 2018-09-14 NOTE — ED Provider Notes (Signed)
[facility] DEPT Provider Note   CSN: [id number] Arrival date & time: 09/15/2018  1444     History   Chief Complaint Chief Complaint  Patient presents with   Aggressive Behavior   Covid +    HPI Wesley Sullivan is a 78 y.o. male.  Level 5 caveat secondary to altered mental status nonverbal.  Patient is a DNR/DNI resident at [facility] who was sent here for aggressive behavior.  Unclear what the behavior was.  Wesley Sullivan was a resident at the Covid unit but there is no documentation that Wesley Sullivan was Covid positive.  Patient is not answering any questions or complying with any part of the exam.     The history is provided by the EMS personnel.  Altered Mental Status Presenting symptoms: combativeness   Severity:  Unable to specify Timing:  Unable to specify Context: dementia and nursing home resident     Past Medical History:  Diagnosis Date   Acute renal failure ([place])    Anemia    Cerebral palsy ([place])    Constipation    Dementia ([place])    Diabetes mellitus    Gastroesophageal reflux disease    Hypertension    Mental retardation    Seizures ([place])    Speech impairment     Patient Active Problem List   Diagnosis Date Noted   Diabetes ([place]) 11/25/2013   Unspecified constipation 06/30/2013   Sepsis ([place]) 03/31/2013   Hypernatremia 03/31/2013   Leukocytosis 03/31/2013   Toxic encephalopathy 03/31/2013   Severe protein-calorie malnutrition ([place]) 03/31/2013   Septic shock ([place]) 03/31/2013   Altered mental status 03/20/2013   Encounter for long-term (current) use of other medications 02/03/2013   HTN (hypertension) 02/24/2011   Psychosis ([place]) 02/16/2011   DM 06/10/2007   ANEMIA 06/10/2007   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 No pertinent surgical history.      Home  Medications    Prior to Admission medications   Medication Sig Start Date End Date Taking? Authorizing Provider  AMBULATORY NON FORMULARY MEDICATION Lorazepam 0.5mg /ml Gel Sig: Apply 30ml twice daily Patient not taking: Reported on 06/22/2017 08/29/13   Hollace Kinnier L, DO  Cholecalciferol (VITAMIN D3) 2000 units TABS Take 2,000 Units by mouth daily.    [provider]  cloNIDine (CATAPRES - DOSED IN MG/24 HR) 0.1 mg/24hr patch Place 0.1 mg onto the skin once a week.    [provider]  divalproex (DEPAKOTE SPRINKLE) 125 MG capsule Take 750 mg by mouth 2 (two) times daily.    [provider]  divalproex (DEPAKOTE) 500 MG DR tablet Take 500 mg by mouth 2 (two) times daily. 03/13/13   Calvert Cantor, MD  lisinopril (PRINIVIL,ZESTRIL) 5 MG tablet Take 5 mg by mouth daily.    [provider]  LORazepam (ATIVAN) 2 MG/ML concentrated solution Take 0.5 mLs (1 mg total) by mouth every 4 (four) hours as needed for anxiety, seizure, sedation or sleep (agitation). Patient not taking: Reported on 06/22/2017 07/15/13   Blanchie Serve, MD  Melatonin 1 MG CAPS Take 1 capsule by mouth daily. Takes at 7pm daily    [provider]  mirtazapine (REMERON) 7.5 MG tablet Take 7.5 mg by mouth daily.    [provider]  phenytoin (DILANTIN) 125 MG/5ML suspension Take 8 mLs (200 mg total) by mouth daily. Patient not taking: Reported  on 06/22/2017 04/11/13   Hollice Espy, MD  phenytoin (DILANTIN) 300 MG ER capsule Take 300 mg by mouth 3 (three) times daily.    [provider]  QUEtiapine (SEROQUEL) 25 MG tablet Take 1 tablet (25 mg total) by mouth 2 (two) times daily. Patient not taking: Reported on 06/22/2017 04/11/13   Hollice Espy, MD  traZODone (DESYREL) 100 MG tablet Take 200 mg by mouth at bedtime.     [provider]  traZODone (DESYREL) 150 MG tablet Take 150 mg by mouth at bedtime.    [provider]  Valproic Acid (DEPAKENE) 250  MG/5ML SYRP syrup Take 10 mLs (500 mg total) by mouth 2 (two) times daily. Patient not taking: Reported on 06/22/2017 04/11/13   Hollice Espy, MD    Family History No family history on file.  Social History Social History   Tobacco Use   Smoking status: Never Smoker   Smokeless tobacco: Never Used  Substance Use Topics   Alcohol use: No   Drug use: No     Allergies   Patient has no known allergies.   Review of Systems Review of Systems  Unable to perform ROS: Mental status change     Physical Exam Updated Vital Signs BP (!) 82/51    Pulse 60    Temp (!) 94.1 F (34.5 C) (Rectal)    Resp 13    SpO2 97%   Physical Exam Vitals signs and nursing note reviewed.  Constitutional:      General: Wesley Sullivan is awake.     Appearance: Wesley Sullivan is well-developed. Wesley Sullivan is cachectic.  HENT:     Head: Normocephalic and atraumatic.  Eyes:     Conjunctiva/sclera: Conjunctivae normal.  Neck:     Musculoskeletal: Neck supple.  Cardiovascular:     Rate and Rhythm: Normal rate and regular rhythm.     Heart sounds: No murmur.  Pulmonary:     Effort: Pulmonary effort is normal. No respiratory distress.     Breath sounds: Normal breath sounds.  Abdominal:     Palpations: Abdomen is soft.     Tenderness: There is no abdominal tenderness.     Comments: Midline surgical scar  Musculoskeletal: Normal range of motion.        General: No tenderness.     Right lower leg: No edema.     Left lower leg: No edema.  Skin:    General: Skin is cool and dry.  Neurological:     Comments: Patient has his eyes open and is able around the room.  Wesley Sullivan does not answer any questions.  There is no gross facial asymmetry.  Wesley Sullivan is not giving any effort with movement of his upper and lower extremities.  Wesley Sullivan will withdraw to pain in all 4 extremities.      ED Treatments / Results  Labs (all labs ordered are listed, but only abnormal results are displayed) Labs Reviewed  SARS CORONAVIRUS 2 (HOSPITAL ORDER,  PERFORMED IN [facility]) - Abnormal; Notable for the following components:      Result Value   SARS Coronavirus 2 POSITIVE (*)    All other components within normal limits  LACTIC ACID, PLASMA - Abnormal; Notable for the following components:   Lactic Acid, Venous 2.0 (*)    All other components within normal limits  COMPREHENSIVE METABOLIC PANEL - Abnormal; Notable for the following components:   Glucose, Bld 108 (*)    Albumin 3.1 (*)  All other components within normal limits  CBC WITH DIFFERENTIAL/PLATELET - Abnormal; Notable for the following components:   WBC 26.5 (*)    RBC 3.06 (*)    Hemoglobin 9.4 (*)    HCT 30.0 (*)    Neutro Abs 23.6 (*)    Monocytes Absolute 1.3 (*)    Abs Immature Granulocytes 0.14 (*)    All other components within normal limits  APTT - Abnormal; Notable for the following components:   aPTT 68 (*)    All other components within normal limits  AMMONIA - Abnormal; Notable for the following components:   Ammonia 49 (*)    All other components within normal limits  VALPROIC ACID LEVEL - Abnormal; Notable for the following components:   Valproic Acid Lvl 48 (*)    All other components within normal limits  CBC WITH DIFFERENTIAL/PLATELET - Abnormal; Notable for the following components:   WBC 22.9 (*)    RBC 3.06 (*)    Hemoglobin 9.4 (*)    HCT 30.1 (*)    Neutro Abs 21.9 (*)    Abs Immature Granulocytes 0.08 (*)    All other components within normal limits  COMPREHENSIVE METABOLIC PANEL - Abnormal; Notable for the following components:   Calcium 8.3 (*)    Total Protein 5.5 (*)    Albumin 2.4 (*)    All other components within normal limits  CULTURE, BLOOD (ROUTINE X 2)  CULTURE, BLOOD (ROUTINE X 2)  RESPIRATORY PANEL BY PCR  MRSA PCR SCREENING  URINE CULTURE  LACTIC ACID, PLASMA  PROTIME-INR  URINALYSIS, ROUTINE W REFLEX MICROSCOPIC  TSH  PROCALCITONIN  PHENYTOIN LEVEL, TOTAL  PROTIME-INR  CORTISOL-AM, BLOOD    PROCALCITONIN  STREP PNEUMONIAE URINARY ANTIGEN  LACTIC ACID, PLASMA  HIV ANTIBODY (ROUTINE TESTING W REFLEX)  LEGIONELLA PNEUMOPHILA SEROGP 1 UR AG  LACTIC ACID, PLASMA  FERRITIN  D-DIMER, QUANTITATIVE (NOT AT [facility])  C-REACTIVE PROTEIN    EKG EKG Interpretation  Date/Time:  Saturday September 14 2018 16:11:18 EDT Ventricular Rate:  59 PR Interval:    QRS Duration: 81 QT Interval:  447 QTC Calculation: 443 R Axis:   21 Text Interpretation:  Sinus rhythm Prolonged PR interval Borderline T wave abnormalities similar to prior 2/15 Confirmed by Meridee ScoreButler, Arica B[place] [phone]([id number]) on 02/07/19 4:24:37 PM   Radiology Ct Head Wo Contrast  Result Date: 02/07/19 CLINICAL DATA:  78 year old male with altered mental status. EXAM: CT HEAD WITHOUT CONTRAST TECHNIQUE: Contiguous axial images were obtained from the base of the skull through the vertex without intravenous contrast. COMPARISON:  06/22/2017 and prior CTs FINDINGS: Brain: No evidence of acute infarction, hemorrhage, hydrocephalus, extra-axial collection or mass lesion/mass effect. Atrophy, chronic small-vessel white matter ischemic changes and remote RIGHT frontal and LEFT occipital infarcts noted. Vascular: Carotid atherosclerotic calcifications again noted. Skull: Normal. Negative for fracture or focal lesion. Sinuses/Orbits: No acute abnormality Other: None IMPRESSION: 1. No evidence of acute intracranial abnormality 2. Atrophy, chronic small-vessel white matter ischemic changes and remote RIGHT frontal and LEFT occipital infarcts. Electronically Signed   By: Harmon PierJeffrey  Hu M.D.   On: 012/18/20 16:51   Dg Chest Port 1 View  Result Date: 02/07/19 CLINICAL DATA:  [facility], pt was in Covid Unit at facility. Unsure when of if pt was actually tested. There is no documentation regarding testing. EXAM: PORTABLE CHEST 1 VIEW COMPARISON:  04/03/2013 FINDINGS: Heart size is normal. There is mild atherosclerotic  calcification of the thoracic aorta.  There is faint patchy density at the LEFT lung base, consistent with early infiltrate. The RIGHT lung is clear. No pulmonary edema. IMPRESSION: Early LEFT lower lobe infiltrate. Electronically Signed   By: Norva Pavlov M.D.   On: 08/23/2018 16:46    Procedures .Critical Care Performed by: Terrilee Files, MD Authorized by: Terrilee Files, MD   Critical care provider statement:    Critical care time (minutes):  45   Critical care was necessary to treat or prevent imminent or life-threatening deterioration of the following conditions:  Sepsis   Critical care was time spent personally by me on the following activities:  Discussions with consultants, evaluation of patient's response to treatment, examination of patient, ordering and performing treatments and interventions, ordering and review of laboratory studies, ordering and review of radiographic studies, pulse oximetry, re-evaluation of patient's condition, obtaining history from patient or surrogate and review of old charts .Central Line  Date/Time: 09/13/2018 9:30 PM Performed by: Terrilee Files, MD Authorized by: Terrilee Files, MD   Consent:    Consent obtained:  Emergent situation   Consent given by:  Healthcare agent   Risks discussed:  Arterial puncture, infection, bleeding, pneumothorax, nerve damage and incorrect placement   Alternatives discussed:  No treatment, delayed treatment and alternative treatment Pre-procedure details:    Hand hygiene: Hand hygiene performed prior to insertion     Sterile barrier technique: All elements of maximal sterile technique followed     Skin preparation:  2% chlorhexidine   Skin preparation agent: Skin preparation agent completely dried prior to procedure   Anesthesia (see MAR for exact dosages):    Anesthesia method:  Local infiltration   Local anesthetic:  Lidocaine 1% w/o epi Procedure details:    Location:  R femoral   Site selection  rationale:  Aggitated   Patient position:  Flat   Procedural supplies:  Triple lumen   Landmarks identified: yes     Ultrasound guidance: yes     Sterile ultrasound techniques: Sterile gel and sterile probe covers were used     Number of attempts:  2   Successful placement: yes   Post-procedure details:    Post-procedure:  Dressing applied and line sutured   Assessment:  Blood return through all ports and free fluid flow   Patient tolerance of procedure:  Tolerated well, no immediate complications   (including critical care time)  Medications Ordered in ED Medications  azithromycin (ZITHROMAX) 500 mg in sodium chloride 0.9 % 250 mL IVPB (0 mg Intravenous Stopped 09/10/2018 1842)  lactated ringers infusion ( Intravenous New Bag/Given 09/15/18 0034)  senna-docusate (Senokot-S) tablet 1 tablet (has no administration in time range)  sorbitol 70 % solution 30 mL (has no administration in time range)  sodium phosphate (FLEET) 7-19 GM/118ML enema 1 enema (has no administration in time range)  ondansetron (ZOFRAN) tablet 4 mg (has no administration in time range)    Or  ondansetron (ZOFRAN) injection 4 mg (has no administration in time range)  LORazepam (ATIVAN) tablet 2 mg (has no administration in time range)  rivastigmine (EXELON) 4.6 mg/24hr 4.6 mg (has no administration in time range)  cholecalciferol (VITAMIN D) tablet 2,000 Units (has no administration in time range)  levETIRAcetam (KEPPRA) IVPB 500 mg/100 mL premix (500 mg Intravenous New Bag/Given 09/15/18 0140)  valproate (DEPACON) 750 mg in dextrose 5 % 50 mL IVPB (750 mg Intravenous New Bag/Given 09/15/18 0944)  methylPREDNISolone sodium succinate (SOLU-MEDROL) 125 mg/2 mL injection 60 mg (60 mg  Intravenous Given 09/15/18 0957)  ivermectin (STROMECTOL) tablet 9 mg (has no administration in time range)  ceFEPIme (MAXIPIME) 2 g in sodium chloride 0.9 % 100 mL IVPB (2 g Intravenous New Bag/Given 09/15/18 0820)  vancomycin (VANCOCIN) 500 mg  in sodium chloride 0.9 % 100 mL IVPB (has no administration in time range)  phenytoin (DILANTIN) 200 mg in sodium chloride 0.9 % 100 mL IVPB (200 mg Intravenous New Bag/Given 09/15/18 1017)  Chlorhexidine Gluconate Cloth 2 % PADS 6 each (has no administration in time range)  MEDLINE mouth rinse (has no administration in time range)  heparin injection 7,500 Units (has no administration in time range)  sodium chloride 0.9 % bolus 1,000 mL (0 mLs Intravenous Stopped 08/24/2018 1640)    And  sodium chloride 0.9 % bolus 1,000 mL (0 mLs Intravenous Stopped 09/19/2018 1811)  ceFEPIme (MAXIPIME) 2 g in sodium chloride 0.9 % 100 mL IVPB (0 g Intravenous Stopped 09/09/2018 2330)  vancomycin (VANCOCIN) 1,500 mg in sodium chloride 0.9 % 500 mL IVPB (1,500 mg Intravenous New Bag/Given 09/15/18 0017)  LORazepam (ATIVAN) injection 0.5 mg (0.5 mg Intravenous Given 09/15/18 0023)     Initial Impression / Assessment and Plan / ED Course  I have reviewed the triage vital signs and the nursing notes.  Pertinent labs & imaging results that were available during my care of the patient were reviewed by me and considered in my medical decision making (see chart for details).  Clinical Course as of Sep 15 1018  Sat Sep 14, 2018  1533 [facility]  They said usually Wesley Sullivan is alert and difficult to understand but can make his wishes known and generally is agitated and does not follow commands.  His blood pressure was very high today and the nurse gave him a new clonidine patch and a sublingual nitro and then his blood pressure dropped into the 80s.  They took off the clonidine patch and his blood pressure did not respond.  Wesley Sullivan said while his blood pressure was elevated in the 220s Wesley Sullivan complained of headache.  Wesley Sullivan also vomited once.   [MB]  1631 Differential includes CNS bleed, sepsis, hypothyroidism, metabolic derangement   [MB]  1634 I reviewed the patient's CT of his head which just looks like after.  No obvious  bleed.  I also reviewed his chest x-ray which possibly has a left-sided pneumonia.  Awaiting radiology input.   [MB]  1650 Patient's lab work started to come back.  Wesley Sullivan has a markedly elevated WBCs of 26,000.  Hemoglobin low but baseline.  Chemistries fairly unremarkable and ammonia slightly high at 49.  Chest x-ray showing early left lower lobe pneumonia.  Have ordered him ceftriaxone and Zithromax.  Will need admission to the hospital.   [MB]  1739 discussed with [facility] hospitalist for admission.   [MB]  1739 Patient lactic acid is slightly elevated at 2.0.  Urinalysis is negative.  Wesley Sullivan is receiving his fluid bolus and antibiotics.   [MB]  1759 Reevaluated patient, his blood pressure is slightly improved in the 90s now.  Wesley Sullivan seems a little more alert and active now also.    [MB]  1930 I received a call from the hospitalist saying the patient is Covid positive and [facility].  Apparently the accepting physician there would like the patient have a central line.  I was able to reach the patient's brother Wesley Sullivan at [phone] who does not consent to getting a  central line.   [MB]  2001 I attempted to reach the admitting hospitalist Dr. Gerri LinsSwayze by text and have also placed a page into the [facility] hospitalist Dr[facility] Imogene Burnhen.  I have not heard back from either.   [MB]  2030 I again talked to the patient's brother Wesley Sullivan who asked me to use a different phone #1 we call him [phone].  Wesley Sullivan is agreeable to central line and pressors if that is what is needed.  I had earlier talked to [facility] Imogene [facility] who said we needed clarification if Wesley Sullivan was going to undergo resuscitation or Wesley Sullivan would not offer his ICU bed for the patient.   [MB]  2131 Attempted to place a right IJ but the patient became increasingly combative and it was dangerous to do the procedure.  We prepped for right femoral and with a lot of difficulty was able to place a femoral line under ultrasound guidance.  The  patient was moving quite a lot during the procedure and there is a risk of possible arterial puncture.  I asked that the ICU check the line when Wesley Sullivan gets there.  Wesley Sullivan is currently in restraints to keep him from moving his leg.   [MB]    Clinical Course User Index [MB] Terrilee FilesButler, Esabella Stockinger C, MD   Wesley Sullivan was evaluated in Emergency Department on 2018-11-15 for the symptoms described in the history of present illness. Wesley Sullivan was evaluated in the context of the global COVID-19 pandemic, which necessitated consideration that the patient might be at risk for infection with the SARS-CoV-2 virus that causes COVID-19. Institutional protocols and algorithms that pertain to the evaluation of patients at risk for COVID-19 are in a state of rapid change based on information released by regulatory bodies including the CDC and federal and state organizations. These policies and algorithms were followed during the patient's care in the ED.      Final Clinical Impressions(s) / ED Diagnoses   Final diagnoses:  Sepsis, due to unspecified organism, unspecified whether acute organ dysfunction present (HCC)  Hypothermia, initial encounter  Altered mental status, unspecified altered mental status type    ED Discharge Orders    None       Terrilee FilesButler, Hema Lanza C, MD 09/15/18 1022

## 2018-09-14 NOTE — ED Notes (Addendum)
CRITICAL VALUE STICKER  CRITICAL VALUE: Lactic 2.0  DATE & TIME NOTIFIED: Oct 06, 2018 1659  MD NOTIFIED: Melina Copa MD  TIME OF NOTIFICATION: 0518

## 2018-09-14 NOTE — ED Notes (Signed)
EDP at bedside placing central line.  

## 2018-09-14 NOTE — H&P (Signed)
Wesley Sullivan is an 78 y.o. male.   Chief Complaint: Hypotension HPI: The patient is a 78 yr old resident of [place] where he was a resident of the COVID unit, although they state that his COVID status is "indeterminate". He has been tested here, but his results are pending. They state that at baseline the patient is agitated and combative. Earlier today the patient's blood pressuer was found to be very elevated at 250/120. The patient was complaining of a headache. The nursing staff placed a new clonidine patch on his and gave him 0.1 mg of nitroglycerin. They then state that the patient became non-verbal, was just staring, and his systolic blood pressure dropped into the 80's.  In the ED the patient remained unresponsive, but awake. He was found to be hypothermic with temp of 94.0. CT head was negative. CXR demonstrated a left sided pneumonia. He has received IV Rocephin and azithromycin in the ED. As the patient was in a COVID unit, I have started him on IV cefepime and vancomycin. Lactic acid was 2.0. He has been placed on the sepsis protocol.  The patient has a Medical history significant for DM II, Dementia, Cerebral Palsy, GERD, Cognitive impairment, Seizures, Speech impairment, Hypertension.  [facility] have been consulted to admit the patient for further evaluation and treatment.  Past Medical History:  Diagnosis Date  . Acute renal failure (HCC)   . Anemia   . Cerebral palsy (HCC)   . Constipation   . Dementia (HCC)   . Diabetes mellitus   . Gastroesophageal reflux disease   . Hypertension   . Mental retardation   . Seizures (HCC)   . Speech impairment     History reviewed. No pertinent surgical history.  No family history on file. Social History:  reports that he has never smoked. He has never used smokeless tobacco. He reports that he does not drink alcohol or use drugs. (Not in a hospital admission)   Allergies: No Known Allergies  Review of systems not  obtained due to patient factors.   General appearance: cachectic and Awake, but unresponsive. Non-verbal. Head: Normocephalic, without obvious abnormality, atraumatic Eyes: PERRLA, No scleral icterus or injection. Unable to evaluate EOMBI due to patient's inability to cooperate with exam. Throat: Patient unable to cooperate with exam. Neck: no adenopathy, no carotid bruit, no JVD, supple, symmetrical, trachea midline and thyroid not enlarged, symmetric, no tenderness/mass/nodules Resp: No increased work of breathing. No wheezes, rales, or rhonchi. Chest wall: no tenderness Cardio: regular rate and rhythm, S1, S2 normal, no murmur, click, rub or gallop GI: soft, non-tender; bowel sounds normal; no masses,  no organomegaly Extremities: extremities normal, atraumatic, no cyanosis or edema Pulses: 2+ and symmetric Skin: Skin color, texture, turgor normal. No rashes or lesions Lymph nodes: Cervical, supraclavicular, and axillary nodes normal. Neurologic: Patient is not able to cooperate with exam.  Results for orders placed or performed during the hospital encounter of 09/10/2018 (from the past 48 hour(s))  Lactic acid, plasma     Status: Abnormal   Collection Time: 08/21/2018  3:30 PM  Result Value Ref Range   Lactic Acid, Venous 2.0 (HH) 0.5 - 1.9 mmol/L    Comment: CRITICAL RESULT CALLED TO, READ BACK BY AND VERIFIED WITH: Margot Ables [id number] @ 1659 BY J SCOTTON Performed at [facility], [address] [place], [place] [address]   Comprehensive metabolic panel     Status: Abnormal   Collection Time: 09/08/2018  3:30 PM  Result Value Ref Range   Sodium 141 135 - 145 mmol/L   Potassium 5.0 3.5 - 5.1 mmol/L   Chloride 104 98 - 111 mmol/L   CO2 26 22 - 32 mmol/L   Glucose, Bld 108 (H) 70 - 99 mg/dL   BUN 23 8 - 23 mg/dL   Creatinine, Ser 8.410.93 0.61 - 1.24 mg/dL   Calcium 9.3 8.9 - 32.410.3 mg/dL   Total Protein 6.5 6.5 - 8.1 g/dL   Albumin 3.1 (L) 3.5 - 5.0 g/dL    AST 25 15 - 41 U/L   ALT 18 0 - 44 U/L   Alkaline Phosphatase 117 38 - 126 U/L   Total Bilirubin 0.3 0.3 - 1.2 mg/dL   GFR calc non Af Amer >60 >60 mL/min   GFR calc Af Amer >60 >60 mL/min   Anion gap 11 5 - 15    Comment: Performed at [facility], [address] [place], [place] [address]  CBC WITH DIFFERENTIAL     Status: Abnormal   Collection Time: 2018-11-04  3:30 PM  Result Value Ref Range   WBC 26.5 (H) 4.0 - 10.5 K/uL   RBC 3.06 (L) 4.22 - 5.81 MIL/uL   Hemoglobin 9.4 (L) 13.0 - 17.0 g/dL   HCT 72.530.0 (L) 36.639.0 - 44.052.0 %   MCV 98.0 80.0 - 100.0 fL   MCH 30.7 26.0 - 34.0 pg   MCHC 31.3 30.0 - 36.0 g/dL   RDW 34.713.8 42.511.5 - 95.615.5 %   Platelets 210 150 - 400 K/uL   nRBC 0.0 0.0 - 0.2 %   Neutrophils Relative % 89 %   Neutro Abs 23.6 (H) 1.7 - 7.7 K/uL   Lymphocytes Relative 5 %   Lymphs Abs 1.3 0.7 - 4.0 K/uL   Monocytes Relative 5 %   Monocytes Absolute 1.3 (H) 0.1 - 1.0 K/uL   Eosinophils Relative 0 %   Eosinophils Absolute 0.0 0.0 - 0.5 K/uL   Basophils Relative 0 %   Basophils Absolute 0.1 0.0 - 0.1 K/uL   WBC Morphology TOXIC GRANULATION     Comment: VACUOLATED NEUTROPHILS   Immature Granulocytes 1 %   Abs Immature Granulocytes 0.14 (H) 0.00 - 0.07 K/uL    Comment: Performed at [facility], [address] [place], [place] [address]  APTT     Status: Abnormal   Collection Time: 2018-11-04  3:30 PM  Result Value Ref Range   aPTT 68 (H) 24 - 36 seconds    Comment:        IF BASELINE aPTT IS ELEVATED, SUGGEST PATIENT RISK ASSESSMENT BE USED TO DETERMINE APPROPRIATE ANTICOAGULANT THERAPY. Performed at [facility], [address] [place], [place] [address]   Protime-INR     Status: None   Collection Time: 2018-11-04  3:30 PM  Result Value Ref Range   Prothrombin Time 13.9 11.4 - 15.2 seconds   INR 1.1 0.8 - 1.2    Comment: (NOTE) INR goal varies based on device and disease states. Performed at [facility], [address] [place], [place] [address]   Urinalysis, Routine w reflex microscopic     Status: None   Collection Time: 2018-11-04  3:30 PM  Result Value Ref Range   Color, Urine YELLOW YELLOW   APPearance CLEAR CLEAR   Specific Gravity, Urine 1.018 1.005 - 1.030   pH 6.0 5.0 - 8.0   Glucose, UA NEGATIVE NEGATIVE mg/dL   Hgb urine  dipstick NEGATIVE NEGATIVE   Bilirubin Urine NEGATIVE NEGATIVE   Ketones, ur NEGATIVE NEGATIVE mg/dL   Protein, ur NEGATIVE NEGATIVE mg/dL   Nitrite NEGATIVE NEGATIVE   Leukocytes,Ua NEGATIVE NEGATIVE    Comment: Performed at [facility], [address] [place], [place] [address]  TSH     Status: None   Collection Time: 12-19-2018  3:30 PM  Result Value Ref Range   TSH 2.019 0.350 - 4.500 uIU/mL    Comment: Performed by a 3rd Generation assay with a functional sensitivity of <=0.01 uIU/mL. Performed at [facility], [address] [place], [place] [address]   Ammonia     Status: Abnormal   Collection Time: 12-19-2018  3:30 PM  Result Value Ref Range   Ammonia 49 (H) 9 - 35 umol/L    Comment: Performed at [facility], [address] [place], [place] [address]  Procalcitonin     Status: None   Collection Time: 12-19-2018  3:30 PM  Result Value Ref Range   Procalcitonin 6.76 ng/mL    Comment:        Interpretation: PCT > 2 ng/mL: Systemic infection (sepsis) is likely, unless other causes are known. (NOTE)       Sepsis PCT Algorithm           Lower Respiratory Tract                                      Infection PCT Algorithm    ----------------------------     ----------------------------         PCT < 0.25 ng/mL                PCT < 0.10 ng/mL         Strongly encourage             Strongly discourage   discontinuation of antibiotics    initiation of antibiotics    ----------------------------     -----------------------------       PCT 0.25 - 0.50 ng/mL            PCT  0.10 - 0.25 ng/mL               OR       >80% decrease in PCT            Discourage initiation of                                            antibiotics      Encourage discontinuation           of antibiotics    ----------------------------     -----------------------------         PCT >= 0.50 ng/mL              PCT 0.26 - 0.50 ng/mL               AND       <80% decrease in PCT              Encourage initiation of  antibiotics       Encourage continuation           of antibiotics    ----------------------------     -----------------------------        PCT >= 0.50 ng/mL                  PCT > 0.50 ng/mL               AND         increase in PCT                  Strongly encourage                                      initiation of antibiotics    Strongly encourage escalation           of antibiotics                                     -----------------------------                                           PCT <= 0.25 ng/mL                                                 OR                                        > 80% decrease in PCT                                     Discontinue / Do not initiate                                             antibiotics Performed at [place] [address] [place], [place] [address]   Phenytoin level, total     Status: None   Collection Time: Oct 09, 2018  3:30 PM  Result Value Ref Range   Phenytoin Lvl 11.4 10.0 - 20.0 ug/mL    Comment: Performed at [facility], [place] [address], [place], [place] [address]  Valproic acid level     Status: Abnormal   Collection Time: October 09, 2018  3:30 PM  Result Value Ref Range   Valproic Acid Lvl 48 (L) 50.0 - 100.0 ug/mL    Comment: Performed at [facility], [place] [address] [place], [place] [address]  SARS Coronavirus 2 (CEPHEID- Performed in [facility] hospital lab), Hosp Order     Status: Abnormal   Collection  Time: 10-09-2018  4:40 PM   Specimen: Nasopharyngeal Swab  Result Value Ref Range   SARS Coronavirus 2 POSITIVE (A) NEGATIVE    Comment: CRITICAL RESULT CALLED TO, READ BACK BY AND VERIFIED  WITH: Antony Odea [id number] @ 1804 BY J SCOTTON (NOTE) If result is NEGATIVE SARS-CoV-2 target nucleic acids are NOT DETECTED. The SARS-CoV-2 RNA is generally detectable in upper and lower  respiratory specimens during the acute phase of infection. The lowest  concentration of SARS-CoV-2 viral copies this assay can detect is 250  copies / mL. A negative result does not preclude SARS-CoV-2 infection  and should not be used as the sole basis for treatment or other  patient management decisions.  A negative result may occur with  improper specimen collection / handling, submission of specimen other  than nasopharyngeal swab, presence of viral mutation(s) within the  areas targeted by this assay, and inadequate number of viral copies  (<250 copies / mL). A negative result must be combined with clinical  observations, patient history, and epidemiological information. If result is POSITIVE SARS-CoV-2 target nucleic acids  are DETECTED. The SARS-CoV-2 RNA is generally detectable in upper and lower  respiratory specimens during the acute phase of infection.  Positive  results are indicative of active infection with SARS-CoV-2.  Clinical  correlation with patient history and other diagnostic information is  necessary to determine patient infection status.  Positive results do  not rule out bacterial infection or co-infection with other viruses. If result is PRESUMPTIVE POSTIVE SARS-CoV-2 nucleic acids MAY BE PRESENT.   A presumptive positive result was obtained on the submitted specimen  and confirmed on repeat testing.  While 2019 novel coronavirus  (SARS-CoV-2) nucleic acids may be present in the submitted sample  additional confirmatory testing may be necessary for epidemiological  and / or clinical  management purposes  to differentiate between  SARS-CoV-2 and other Sarbecovirus currently known to infect humans.  If clinically indicated additional testing with an alternate test  methodol ogy [id number]) is advised. The SARS-CoV-2 RNA is generally  detectable in upper and lower respiratory specimens during the acute  phase of infection. The expected result is Negative. Fact Sheet for Patients:  BoilerBrush.com.cy Fact Sheet for Healthcare Providers: https://pope.com/ This test is not yet approved or cleared by the [place] FDA and has been authorized for detection and/or diagnosis of SARS-CoV-2 by FDA under an Emergency Use Authorization (EUA).  This EUA will remain in effect (meaning this test can be used) for the duration of the COVID-19 declaration under Section 564(b)(1) of the Act, 21 U.S.C. section 360bbb-3(b)(1), unless the authorization is terminated or revoked sooner. Performed at [facility], [address] [place], [place] [address]    @  Blood pressure 94/62, pulse 69, temperature (!) 94.1 F (34.5 C), temperature source Rectal, resp. rate 11, height  (1.778 m), weight 64.9 kg, SpO2 94 %.    Assessment/Plan Severe Sepsis: Patient is hypotensive, hypothermic, with altered mental status, Lactic acid of 2 and WBC of 25.2. He will be admitted to the ICU. He is on a [employer], and has been placed on the sepsis protocol. Blood cultures x 2, urine cultures, and Ua have been collected.  COVID 19 Positive: the patient will be given Solumedrol 60 mg daily and 40 mg lovenox BID. I will check Ferritin, D Dimer, and CRP. I have discussed the patient with Dr. Imogene Burn. The patient is being admitted to [facility] ICU due to severe sepsis. I have asked Dr. Charm Barges to place Central line. He stated that he would talk to the family to get consent.  Left sided pneumonia: The patient is receiving IV Cefepime and  IV Vancomycin as he was  on a COVID unit [facility]. Blood cultures x 2 have been drawn.   Seizure disorder: The patient's dilantin, depakote, and keppra have been converted to IV. He will be placed on seizure precautions.   Altered mental status: Assumed to be due to sepsis, although the patient has COVID-19 and is at risk for CVA. CT head is negative. Patient will be kept NPO due to aspiration risk. Noted to be agitated and combative at baseline. He is verbal at baseline, but difficult to understand.  Cerebral Palsy: Noted.  Cognitive impairment: At baseline the patient is known to be agitated and combative. He does speak at baseline, but is difficult to understand.   Essential hypertension: Antihypertensives have been held due to hypotension.  DM II: Patient does not appear to be on antihyperglycemics at [facility]. I will put him on Correction SSI. He will be NPO for now, as I feel that he is an aspiration risk.  I have seen and examined this patient myself. I have spent 84 minutes in his evaluation and care.  CODE STATUS: DNR DVT Prophylaxis: Lovenox and SCD Disposition: Back to [facility] Family Communication: None available.   Shawnice Tilmon 08/23/2018, 6:19 PM

## 2018-09-14 NOTE — Progress Notes (Signed)
eLink Physician-Brief Progress Note Patient Name: Wesley Sullivan DOB: Sep 17, 1940 MRN: [id number]   Date of Service  09/05/2018  HPI/Events of Note  Red alert. Discussed with bed side RN; agitated, restless post central line . Hx of Mental retardation. Covid +. Severe sepsis from LL PNA. Cerebral palsy.  Sz disorder.   Video eval done.   eICU Interventions  - stat low dose ativan 0.5 mg  - protecting airways. - on abx.      Intervention Category Major Interventions: Delirium, psychosis, severe agitation - evaluation and management  Elmer Sow 09/06/2018, 10:45 PM

## 2018-09-14 NOTE — Progress Notes (Signed)
Pharmacy Antibiotic Note  KEY CEN is a 78 y.o. male admitted on 09/11/2018 with pneumonia.  Pharmacy has been consulted for vanc/cefepime dosing.  Plan: 1) Vanc 1500mg  x 1 then 1250mg  IV q24 - goal AUC 400-550 2) Cefepime 2g IV q8 per current renal function   Height: 5\' 10"  (177.8 cm) Weight: 143 lb 1.3 oz (64.9 kg) IBW/kg (Calculated) : 73  Temp (24hrs), Avg:94.1 F (34.5 C), Min:94.1 F (34.5 C), Max:94.1 F (34.5 C)  Recent Labs  Lab 08/24/2018 1530  WBC 26.5*  CREATININE 0.93  LATICACIDVEN 2.0*    Estimated Creatinine Clearance: 61.1 mL/min (by C-G formula based on SCr of 0.93 mg/dL).    No Known Allergies   Thank you for allowing pharmacy to be a part of this patient's care.  Kara Mead 09/01/2018 6:12 PM

## 2018-09-14 NOTE — ED Triage Notes (Signed)
BIB EMS from [facility] due to aggressive behavior, pt was in Covid Unit at facility. Unsure when of if pt was actually tested. There is no documentation regarding testing. Pt was combative at the beginning but calm when her arrived. 120/86- 60-16- 96 % RA, CBG 162

## 2018-09-14 NOTE — ED Notes (Signed)
Terri, RN gave report to RN at [employer]

## 2018-09-14 NOTE — ED Notes (Addendum)
Attempted give report. Rn will call back. Pt will be going to [phone]

## 2018-09-14 NOTE — ED Notes (Signed)
Will call back for report

## 2018-09-14 NOTE — ED Notes (Addendum)
Per MD, family refused central line. MD paged admitting MD at [facility]

## 2018-09-14 NOTE — ED Notes (Signed)
ED TO INPATIENT HANDOFF REPORT  Name/Age/Gender Wesley Sullivan 78 y.o. male  Code Status    Code Status Orders  (From admission, onward)         Start     Ordered   11-30-2018 1807  Do not attempt resuscitation (DNR)  Continuous    Question Answer Comment  In the event of cardiac or respiratory ARREST Do not call a "code blue"   In the event of cardiac or respiratory ARREST Do not perform Intubation, CPR, defibrillation or ACLS   In the event of cardiac or respiratory ARREST Use medication by any route, position, wound care, and other measures to relive pain and suffering. May use oxygen, suction and manual treatment of airway obstruction as needed for comfort.      11-30-2018 1809        Code Status History    Date Active Date Inactive Code Status Order ID Comments User Context   2018-06-01 1809 2018-06-01 1809 DNR [id number]  Fran LowesSwayze, Ava, DO ED   03/31/2013 2059 04/11/2013 1911 DNR [id number]  Leda GauzeKirby-Graham, Karen J, NP Inpatient   03/31/2013 1941 03/31/2013 2059 Full Code [id number]  Marinda ElkFeliz Ortiz, Abraham, MD Inpatient   02/14/2011 1727 03/23/2011 2106 Full Code [id number]  Hurman HornBednar, John M, MD ED   Advance Care Planning Activity      Home/SNF/Other Nursing Home  Chief Complaint covid +; combative; hypertension  Level of Care/Admitting Diagnosis ED Disposition    ED Disposition Condition Comment   Admit  [facility] [[id number]]  Level of Care: ICU [6]  Covid Evaluation: Confirmed COVID Positive  Diagnosis: Severe sepsis with acute organ dysfunction [facility](HCC) [[id number]]) [[id number]]  Admitting Physician: Milinda AntisSWAYZE, AVA [[id number]]  Attending Physician: Gerri LinsSWAYZE, AVA [email or web address][[id number]]  Estimated length of stay: 5 - 7 days  Certification:: I certify this patient will need inpatient services for at least 2 midnights  PT Class (Do Not Modify): Inpatient [101]  PT Acc Code (Do Not Modify): Private [1]       Medical History Past Medical History:  Diagnosis Date  . Acute renal failure  (HCC)   . Anemia   . Cerebral palsy (HCC)   . Constipation   . Dementia (HCC)   . Diabetes mellitus   . Gastroesophageal reflux disease   . Hypertension   . Mental retardation   . Seizures (HCC)   . Speech impairment     Allergies No Known Allergies  IV Location/Drains/Wounds Patient Lines/Drains/Airways Status   Active Line/Drains/Airways    Name:   Placement date:   Placement time:   Site:   Days:   Peripheral IV 11-30-2018 Left Hand   11-30-2018    1610    Hand   less than 1          Labs/Imaging Results for orders placed or performed during the hospital encounter of 11-30-2018 (from the past 48 hour(s))  Lactic acid, plasma     Status: Abnormal   Collection Time: 11-30-2018  3:30 PM  Result Value Ref Range   Lactic Acid, Venous 2.0 (HH) 0.5 - 1.9 mmol/L    Comment: CRITICAL RESULT CALLED TO, READ BACK BY AND VERIFIED WITH: Margot AblesARLIE FRANKLIN,RN [id number]/23/20 @ 1659 BY J SCOTTON Performed at [facility], [address] [place], [place] [address]   Lactic acid, plasma     Status: None   Collection Time: 11-30-2018  3:30 PM  Result Value Ref Range   Lactic Acid, Venous 1.3 0.5 - 1.9 mmol/L  Comment: Performed at [facility], [address] [place], [place] [address]  Comprehensive metabolic panel     Status: Abnormal   Collection Time: 09/04/2018  3:30 PM  Result Value Ref Range   Sodium 141 135 - 145 mmol/L   Potassium 5.0 3.5 - 5.1 mmol/L   Chloride 104 98 - 111 mmol/L   CO2 26 22 - 32 mmol/L   Glucose, Bld 108 (H) 70 - 99 mg/dL   BUN 23 8 - 23 mg/dL   Creatinine, Ser 6.040.93 0.61 - 1.24 mg/dL   Calcium 9.3 8.9 - 54.010.3 mg/dL   Total Protein 6.5 6.5 - 8.1 g/dL   Albumin 3.1 (L) 3.5 - 5.0 g/dL   AST 25 15 - 41 U/L   ALT 18 0 - 44 U/L   Alkaline Phosphatase 117 38 - 126 U/L   Total Bilirubin 0.3 0.3 - 1.2 mg/dL   GFR calc non Af Amer >60 >60 mL/min   GFR calc Af Amer >60 >60 mL/min   Anion gap 11 5 - 15    Comment: Performed at [facility], [address] [place], [place] [address]  CBC WITH DIFFERENTIAL     Status: Abnormal   Collection Time: 09/06/2018  3:30 PM  Result Value Ref Range   WBC 26.5 (H) 4.0 - 10.5 K/uL   RBC 3.06 (L) 4.22 - 5.81 MIL/uL   Hemoglobin 9.4 (L) 13.0 - 17.0 g/dL   HCT 14.730.0 (L) 82.939.0 - 56.252.0 %   MCV 98.0 80.0 - 100.0 fL   MCH 30.7 26.0 - 34.0 pg   MCHC 31.3 30.0 - 36.0 g/dL   RDW 13.013.8 86.511.5 - 78.415.5 %   Platelets 210 150 - 400 K/uL   nRBC 0.0 0.0 - 0.2 %   Neutrophils Relative % 89 %   Neutro Abs 23.6 (H) 1.7 - 7.7 K/uL   Lymphocytes Relative 5 %   Lymphs Abs 1.3 0.7 - 4.0 K/uL   Monocytes Relative 5 %   Monocytes Absolute 1.3 (H) 0.1 - 1.0 K/uL   Eosinophils Relative 0 %   Eosinophils Absolute 0.0 0.0 - 0.5 K/uL   Basophils Relative 0 %   Basophils Absolute 0.1 0.0 - 0.1 K/uL   WBC Morphology TOXIC GRANULATION     Comment: VACUOLATED NEUTROPHILS   Immature Granulocytes 1 %   Abs Immature Granulocytes 0.14 (H) 0.00 - 0.07 K/uL    Comment: Performed at [facility], [address] [place], [place] [address]  APTT     Status: Abnormal   Collection Time: 09/13/2018  3:30 PM  Result Value Ref Range   aPTT 68 (H) 24 - 36 seconds    Comment:        IF BASELINE aPTT IS ELEVATED, SUGGEST PATIENT RISK ASSESSMENT BE USED TO DETERMINE APPROPRIATE ANTICOAGULANT THERAPY. Performed at [facility], [address] [place], [place] [address]   Protime-INR     Status: None   Collection Time: 09/19/2018  3:30 PM  Result Value Ref Range   Prothrombin Time 13.9 11.4 - 15.2 seconds   INR 1.1 0.8 - 1.2    Comment: (NOTE) INR goal varies based on device and disease states. Performed at [facility], [address] [place], [place] [address]   Urinalysis, Routine w reflex microscopic     Status: None   Collection Time: 09/18/2018  3:30 PM  Result Value Ref Range   Color, Urine YELLOW YELLOW  APPearance CLEAR CLEAR    Specific Gravity, Urine 1.018 1.005 - 1.030   pH 6.0 5.0 - 8.0   Glucose, UA NEGATIVE NEGATIVE mg/dL   Hgb urine dipstick NEGATIVE NEGATIVE   Bilirubin Urine NEGATIVE NEGATIVE   Ketones, ur NEGATIVE NEGATIVE mg/dL   Protein, ur NEGATIVE NEGATIVE mg/dL   Nitrite NEGATIVE NEGATIVE   Leukocytes,Ua NEGATIVE NEGATIVE    Comment: Performed at [facility], [address], [place], [place] [address]  TSH     Status: None   Collection Time: 09/18/2018  3:30 PM  Result Value Ref Range   TSH 2.019 0.350 - 4.500 uIU/mL    Comment: Performed by a 3rd Generation assay with a functional sensitivity of <=0.01 uIU/mL. Performed at [facility], [address], [place], [place] [address]   Ammonia     Status: Abnormal   Collection Time: 09/10/2018  3:30 PM  Result Value Ref Range   Ammonia 49 (H) 9 - 35 umol/L    Comment: Performed at [facility], [address], [place], [place] [address]  Procalcitonin     Status: None   Collection Time: 08/26/2018  3:30 PM  Result Value Ref Range   Procalcitonin 6.76 ng/mL    Comment:        Interpretation: PCT > 2 ng/mL: Systemic infection (sepsis) is likely, unless other causes are known. (NOTE)       Sepsis PCT Algorithm           Lower Respiratory Tract                                      Infection PCT Algorithm    ----------------------------     ----------------------------         PCT < 0.25 ng/mL                PCT < 0.10 ng/mL         Strongly encourage             Strongly discourage   discontinuation of antibiotics    initiation of antibiotics    ----------------------------     -----------------------------       PCT 0.25 - 0.50 ng/mL            PCT 0.10 - 0.25 ng/mL               OR       >80% decrease in PCT            Discourage initiation of                                            antibiotics      Encourage discontinuation           of antibiotics    ----------------------------      -----------------------------         PCT >= 0.50 ng/mL              PCT 0.26 - 0.50 ng/mL               AND       <80% decrease in PCT              Encourage initiation of  antibiotics       Encourage continuation           of antibiotics    ----------------------------     -----------------------------        PCT >= 0.50 ng/mL                  PCT > 0.50 ng/mL               AND         increase in PCT                  Strongly encourage                                      initiation of antibiotics    Strongly encourage escalation           of antibiotics                                     -----------------------------                                           PCT <= 0.25 ng/mL                                                 OR                                        > 80% decrease in PCT                                     Discontinue / Do not initiate                                             antibiotics Performed at [facility], [address], [place], [place] [address]   Phenytoin level, total     Status: None   Collection Time: 08/25/2018  3:30 PM  Result Value Ref Range   Phenytoin Lvl 11.4 10.0 - 20.0 ug/mL    Comment: Performed at [facility], [address], [place], [place] [address]  Valproic acid level     Status: Abnormal   Collection Time: 08/30/2018  3:30 PM  Result Value Ref Range   Valproic Acid Lvl 48 (L) 50.0 - 100.0 ug/mL    Comment: Performed at [facility], [address], [place], [place] [address]  SARS Coronavirus 2 (CEPHEID- Performed in [facility] hospital lab), Hosp Order     Status: Abnormal   Collection Time: 09/18/2018  4:40 PM   Specimen: Nasopharyngeal Swab  Result Value Ref Range   SARS Coronavirus 2 POSITIVE (A) NEGATIVE    Comment: CRITICAL RESULT CALLED TO, READ BACK BY AND VERIFIED WITH:  Donley Redder [id number] @ 5329 BY J  SCOTTON (NOTE) If result is NEGATIVE SARS-CoV-2 target nucleic acids are NOT DETECTED. The SARS-CoV-2 RNA is generally detectable in upper and lower  respiratory specimens during the acute phase of infection. The lowest  concentration of SARS-CoV-2 viral copies this assay can detect is 250  copies / mL. A negative result does not preclude SARS-CoV-2 infection  and should not be used as the sole basis for treatment or other  patient management decisions.  A negative result may occur with  improper specimen collection / handling, submission of specimen other  than nasopharyngeal swab, presence of viral mutation(s) within the  areas targeted by this assay, and inadequate number of viral copies  (<250 copies / mL). A negative result must be combined with clinical  observations, patient history, and epidemiological information. If result is POSITIVE SARS-CoV-2 target nucleic acids  are DETECTED. The SARS-CoV-2 RNA is generally detectable in upper and lower  respiratory specimens during the acute phase of infection.  Positive  results are indicative of active infection with SARS-CoV-2.  Clinical  correlation with patient history and other diagnostic information is  necessary to determine patient infection status.  Positive results do  not rule out bacterial infection or co-infection with other viruses. If result is PRESUMPTIVE POSTIVE SARS-CoV-2 nucleic acids MAY BE PRESENT.   A presumptive positive result was obtained on the submitted specimen  and confirmed on repeat testing.  While 2019 novel coronavirus  (SARS-CoV-2) nucleic acids may be present in the submitted sample  additional confirmatory testing may be necessary for epidemiological  and / or clinical management purposes  to differentiate between  SARS-CoV-2 and other Sarbecovirus currently known to infect humans.  If clinically indicated additional testing with an alternate test  methodol [phone]) is advised. The SARS-CoV-2  RNA is generally  detectable in upper and lower respiratory specimens during the acute  phase of infection. The expected result is Negative. Fact Sheet for Patients:  StrictlyIdeas.no Fact Sheet for Healthcare Providers: BankingDealers.co.za This test is not yet approved or cleared by the [place] FDA and has been authorized for detection and/or diagnosis of SARS-CoV-2 by FDA under an Emergency Use Authorization (EUA).  This EUA will remain in effect (meaning this test can be used) for the duration of the COVID-19 declaration under Section 564(b)(1) of the Act, 21 U.S.C. section 360bbb-3(b)(1), unless the authorization is terminated or revoked sooner. Performed at [facility], [place] [address] [place], [place] [address]    Ct Head Wo Contrast  Result Date: 09/17/2018 CLINICAL DATA:  78 year old male with altered mental status. EXAM: CT HEAD WITHOUT CONTRAST TECHNIQUE: Contiguous axial images were obtained from the base of the skull through the vertex without intravenous contrast. COMPARISON:  06/22/2017 and prior CTs FINDINGS: Brain: No evidence of acute infarction, hemorrhage, hydrocephalus, extra-axial collection or mass lesion/mass effect. Atrophy, chronic small-vessel white matter ischemic changes and remote RIGHT frontal and LEFT occipital infarcts noted. Vascular: Carotid atherosclerotic calcifications again noted. Skull: Normal. Negative for fracture or focal lesion. Sinuses/Orbits: No acute abnormality Other: None IMPRESSION: 1. No evidence of acute intracranial abnormality 2. Atrophy, chronic small-vessel white matter ischemic changes and remote RIGHT frontal and LEFT occipital infarcts. Electronically Signed   By: Margarette [place] M.D.   On: 08/22/2018 16:51   Dg Chest Port 1 View  Result Date: 09/19/2018 CLINICAL DATA:  BIB [facility] due to aggressive behavior, pt was in Covid Unit at facility. Unsure when  of if pt was actually tested. There is no documentation regarding testing. EXAM: PORTABLE CHEST 1 VIEW COMPARISON:  04/03/2013 FINDINGS: Heart size is normal. There is mild atherosclerotic calcification of the thoracic aorta. There is faint patchy density at the LEFT lung base, consistent with early infiltrate. The RIGHT lung is clear. No pulmonary edema. IMPRESSION: Early LEFT lower lobe infiltrate. Electronically Signed   By: Norva Pavlov M.D.   On: 09/19/2018 16:46    Pending Labs Unresulted Labs (From admission, onward)    Start     Ordered   2018/09/27 0500  Creatinine, serum  (enoxaparin (LOVENOX)    CrCl >/= 30 ml/min)  Weekly,   R    Comments: while on enoxaparin therapy    09/09/2018 1809   27-Sep-2018 0500  Creatinine, serum  (enoxaparin (LOVENOX)    CrCl >/= 30 ml/min)  Weekly,   R    Comments: while on enoxaparin therapy    09/04/2018 1809   09/15/18 0500  Protime-INR  Tomorrow morning,   R     08/22/2018 1809   09/15/18 0500  Cortisol-am, blood  Tomorrow morning,   R     09/01/2018 1809   09/15/18 0500  Procalcitonin  Tomorrow morning,   R     09/09/2018 1809   09/15/18 0500  CBC WITH DIFFERENTIAL  Tomorrow morning,   R     09/05/2018 1809   09/15/18 0500  Comprehensive metabolic panel  Tomorrow morning,   R     08/27/2018 1809   09/15/18 0500  Creatinine, serum  Daily,   R     09/07/2018 1817   09/13/2018 1929  Ferritin  Once,   STAT     08/30/2018 1928   08/21/2018 1929  D-dimer, quantitative (not at [facility])  Once,   STAT     09/16/2018 1928   08/23/2018 1929  C-reactive protein  Once,   STAT     09/18/2018 1928   08/29/2018 1818  MRSA PCR Screening  Once,   STAT     09/05/2018 1817   08/21/2018 1815  Lactic acid, plasma  STAT Now then every 3 hours,   R (with STAT occurrences)     08/30/2018 1814   09/03/2018 1804  Strep pneumoniae urinary antigen  Once,   STAT     08/30/2018 1809   08/29/2018 1804  Legionella Pneumophila Serogp 1 Ur Ag  Once,   STAT     09/16/2018 1809   09/04/2018 1804  Respiratory Panel by  PCR  (Respiratory virus panel with precautions)  Once,   STAT     09/13/2018 1809   09/11/2018 1756  HIV antibody (Routine Screening)  Once,   STAT     09/05/2018 1809   09/10/2018 1517  Blood Culture (routine x 2)  BLOOD CULTURE X 2,   STAT    Question:  Patient immune status  Answer:  Normal   09/05/2018 1520   08/21/2018 1517  Urine culture  ONCE - STAT,   STAT    Question:  Patient immune status  Answer:  Normal   09/02/2018 1520          Vitals/Pain Today's Vitals   09/02/2018 1715 09/07/2018 1725 09/20/2018 1754 08/31/2018 1805  BP: 94/62     Pulse: (!) 59  65 69  Resp: Temp:      TempSrc:      SpO2: 91%  94% 94%  Weight:  64.9 kg  Height:  5\' 10"  (1.778 m)      Isolation Precautions Droplet and Contact precautions  Medications Medications  azithromycin (ZITHROMAX) 500 mg in sodium chloride 0.9 % 250 mL IVPB (0 mg Intravenous Stopped 09/02/2018 1842)  lactated ringers infusion ( Intravenous New Bag/Given 09/17/2018 1857)  enoxaparin (LOVENOX) injection 40 mg (has no administration in time range)  hydrocortisone sodium succinate (SOLU-CORTEF) 100 MG injection 50 mg (50 mg Intravenous Given 09/19/2018 1858)  senna-docusate (Senokot-S) tablet 1 tablet (has no administration in time range)  sorbitol 70 % solution 30 mL (has no administration in time range)  sodium phosphate (FLEET) 7-19 GM/118ML enema 1 enema (has no administration in time range)  ondansetron (ZOFRAN) tablet 4 mg (has no administration in time range)    Or  ondansetron (ZOFRAN) injection 4 mg (has no administration in time range)  vancomycin (VANCOCIN) 1,500 mg in sodium chloride 0.9 % 500 mL IVPB (has no administration in time range)  vancomycin (VANCOCIN) 1,250 mg in sodium chloride 0.9 % 250 mL IVPB (has no administration in time range)  ceFEPIme (MAXIPIME) 2 g in sodium chloride 0.9 % 100 mL IVPB (has no administration in time range)  methylPREDNISolone sodium succinate (SOLU-MEDROL) 125 mg/2 mL injection 60 mg (has  no administration in time range)  sodium chloride 0.9 % bolus 1,000 mL (0 mLs Intravenous Stopped 09/15/2018 1640)    And  sodium chloride 0.9 % bolus 1,000 mL (0 mLs Intravenous Stopped 08/21/2018 1811)  ceFEPIme (MAXIPIME) 2 g in sodium chloride 0.9 % 100 mL IVPB (2 g Intravenous New Bag/Given 09/07/2018 1858)    Mobility non-ambulatory

## 2018-09-15 ENCOUNTER — Other Ambulatory Visit: Payer: Self-pay

## 2018-09-15 DIAGNOSIS — G40909 Epilepsy, unspecified, not intractable, without status epilepticus: Secondary | ICD-10-CM

## 2018-09-15 DIAGNOSIS — T68XXXA Hypothermia, initial encounter: Secondary | ICD-10-CM

## 2018-09-15 DIAGNOSIS — R6520 Severe sepsis without septic shock: Secondary | ICD-10-CM

## 2018-09-15 DIAGNOSIS — A419 Sepsis, unspecified organism: Secondary | ICD-10-CM

## 2018-09-15 DIAGNOSIS — L8990 Pressure ulcer of unspecified site, unspecified stage: Secondary | ICD-10-CM | POA: Diagnosis present

## 2018-09-15 DIAGNOSIS — R4182 Altered mental status, unspecified: Secondary | ICD-10-CM

## 2018-09-15 LAB — CBC WITH DIFFERENTIAL/PLATELET
Abs Immature Granulocytes: 0.08 10*3/uL — ABNORMAL HIGH (ref 0.00–0.07)
Basophils Absolute: 0 10*3/uL (ref 0.0–0.1)
Basophils Relative: 0 %
Eosinophils Absolute: 0 10*3/uL (ref 0.0–0.5)
Eosinophils Relative: 0 %
HCT: 30.1 % — ABNORMAL LOW (ref 39.0–52.0)
Hemoglobin: 9.4 g/dL — ABNORMAL LOW (ref 13.0–17.0)
Immature Granulocytes: 0 %
Lymphocytes Relative: 3 %
Lymphs Abs: 0.7 10*3/uL (ref 0.7–4.0)
MCH: 30.7 pg (ref 26.0–34.0)
MCHC: 31.2 g/dL (ref 30.0–36.0)
MCV: 98.4 fL (ref 80.0–100.0)
Monocytes Absolute: 0.2 10*3/uL (ref 0.1–1.0)
Monocytes Relative: 1 %
Neutro Abs: 21.9 10*3/uL — ABNORMAL HIGH (ref 1.7–7.7)
Neutrophils Relative %: 96 %
Platelets: 212 10*3/uL (ref 150–400)
RBC: 3.06 MIL/uL — ABNORMAL LOW (ref 4.22–5.81)
RDW: 13.7 % (ref 11.5–15.5)
WBC: 22.9 10*3/uL — ABNORMAL HIGH (ref 4.0–10.5)
nRBC: 0 % (ref 0.0–0.2)

## 2018-09-15 LAB — COMPREHENSIVE METABOLIC PANEL
ALT: 16 U/L (ref 0–44)
AST: 19 U/L (ref 15–41)
Albumin: 2.4 g/dL — ABNORMAL LOW (ref 3.5–5.0)
Alkaline Phosphatase: 113 U/L (ref 38–126)
Anion gap: 10 (ref 5–15)
BUN: 19 mg/dL (ref 8–23)
CO2: 25 mmol/L (ref 22–32)
Calcium: 8.3 mg/dL — ABNORMAL LOW (ref 8.9–10.3)
Chloride: 108 mmol/L (ref 98–111)
Creatinine, Ser: 0.7 mg/dL (ref 0.61–1.24)
GFR calc Af Amer: >60 mL/min (ref >60)
GFR calc non Af Amer: >60 mL/min (ref >60)
Glucose, Bld: 93 mg/dL (ref 70–99)
Potassium: 4.3 mmol/L (ref 3.5–5.1)
Sodium: 143 mmol/L (ref 135–145)
Total Bilirubin: 0.3 mg/dL (ref 0.3–1.2)
Total Protein: 5.5 g/dL — ABNORMAL LOW (ref 6.5–8.1)

## 2018-09-15 LAB — URINE CULTURE
Culture: NO GROWTH
Special Requests: NORMAL

## 2018-09-15 LAB — RESPIRATORY PANEL BY PCR

## 2018-09-15 LAB — STREP PNEUMONIAE URINARY ANTIGEN: Strep Pneumo Urinary Antigen: NEGATIVE

## 2018-09-15 LAB — PROTIME-INR
INR: 1.2 (ref 0.8–1.2)
Prothrombin Time: 15 seconds (ref 11.4–15.2)

## 2018-09-15 LAB — HIV ANTIBODY (ROUTINE TESTING W REFLEX): HIV Screen 4th Generation wRfx: NONREACTIVE

## 2018-09-15 LAB — CORTISOL-AM, BLOOD: Cortisol - AM: 8.7 ug/dL (ref 6.7–22.6)

## 2018-09-15 LAB — PROCALCITONIN: Procalcitonin: 4.18 ng/mL

## 2018-09-15 LAB — LACTIC ACID, PLASMA: Lactic Acid, Venous: 1.3 mmol/L (ref 0.5–1.9)

## 2018-09-15 LAB — MRSA PCR SCREENING: MRSA by PCR: NEGATIVE

## 2018-09-15 MED ORDER — SODIUM CHLORIDE 0.9 % IV SOLN
200.0000 mg | Freq: Three times a day (TID) | INTRAVENOUS | Status: DC
  Administered 2018-09-15 – 2018-09-20 (×16): 200 mg via INTRAVENOUS
  Filled 2018-09-15 (×20): qty 4

## 2018-09-15 MED ORDER — CHLORHEXIDINE GLUCONATE 0.12 % MT SOLN
15.0000 mL | Freq: Two times a day (BID) | OROMUCOSAL | Status: DC
  Administered 2018-09-15 – 2018-09-20 (×7): 15 mL via OROMUCOSAL
  Filled 2018-09-15 (×7): qty 15

## 2018-09-15 MED ORDER — SODIUM CHLORIDE 0.9 % IV SOLN
2.0000 g | Freq: Two times a day (BID) | INTRAVENOUS | Status: DC
  Administered 2018-09-15 – 2018-09-18 (×7): 2 g via INTRAVENOUS
  Filled 2018-09-15 (×8): qty 2

## 2018-09-15 MED ORDER — SODIUM CHLORIDE 0.9% FLUSH
10.0000 mL | Freq: Two times a day (BID) | INTRAVENOUS | Status: DC
  Administered 2018-09-15: 40 mL
  Administered 2018-09-18 (×2): 10 mL

## 2018-09-15 MED ORDER — HEPARIN SODIUM (PORCINE) 10000 UNIT/ML IJ SOLN
7500.0000 [IU] | Freq: Three times a day (TID) | INTRAMUSCULAR | Status: DC
  Administered 2018-09-15: 7500 [IU] via SUBCUTANEOUS
  Administered 2018-09-16: 7.5 [IU] via SUBCUTANEOUS
  Filled 2018-09-15 (×2): qty 1

## 2018-09-15 MED ORDER — CHLORHEXIDINE GLUCONATE CLOTH 2 % EX PADS
6.0000 | MEDICATED_PAD | Freq: Every day | CUTANEOUS | Status: DC
  Administered 2018-09-16 – 2018-09-20 (×3): 6 via TOPICAL

## 2018-09-15 MED ORDER — SODIUM CHLORIDE 0.9% FLUSH: 10.0000 mL | INTRAVENOUS | Status: DC | PRN

## 2018-09-15 MED ORDER — ORAL CARE MOUTH RINSE
15.0000 mL | Freq: Two times a day (BID) | OROMUCOSAL | Status: DC
  Administered 2018-09-15 – 2018-09-20 (×7): 15 mL via OROMUCOSAL

## 2018-09-15 MED ORDER — HEPARIN SODIUM (PORCINE) 10000 UNIT/ML IJ SOLN: 7500.0000 [IU] | Freq: Three times a day (TID) | INTRAMUSCULAR | Status: DC

## 2018-09-15 MED ORDER — HALOPERIDOL LACTATE 5 MG/ML IJ SOLN
2.0000 mg | Freq: Four times a day (QID) | INTRAMUSCULAR | Status: DC | PRN
  Administered 2018-09-15 – 2018-09-16 (×3): 2 mg via INTRAVENOUS
  Filled 2018-09-15 (×3): qty 1

## 2018-09-15 MED ORDER — VANCOMYCIN HCL 500 MG IV SOLR
500.0000 mg | INTRAVENOUS | Status: DC
  Administered 2018-09-15: 23:00:00 500 mg via INTRAVENOUS
  Filled 2018-09-15: qty 500

## 2018-09-15 MED ORDER — ORAL CARE MOUTH RINSE: 15.0000 mL | Freq: Two times a day (BID) | OROMUCOSAL | Status: DC

## 2018-09-15 NOTE — Progress Notes (Addendum)
PROGRESS NOTE  Wesley INGRAHAM ZOX:[id number] DOB: 06-Jan-1941 DOA: 09/09/2018  PCP: Renaye Rakers, MD  Brief History/Interval Summary: 78 yr old resident of [name] with a past medical history significant for DM II, Dementia, Cerebral Palsy, GERD, Cognitive impairment, Seizures, Speech impairment, Hypertension.  At the skilled nursing facility he was in the COVID unit.  At baseline patient is known to have episodes of agitation and combativeness.  On the day of admission he was noted to be hypertensive.  Apparently was complaining of a headache.  New clonidine patch was prescribed and he was given nitroglycerin.  After that apparently he had a drop in his blood pressure and became nonverbal.  In the emergency department he was found to be hypothermic.  CT scan of the head did not show any acute findings.  A left-sided pneumonia was noted on chest x-ray.  Patient was placed on antibiotics.  Central line was placed.  He was admitted to the intensive care unit.    Reason for Visit: Healthcare associated pneumonia  Consultants: None  Procedures: Central line placement into the femoral vein  Antibiotics: Anti-infectives (From admission, onward)   Start     Dose/Rate Route Frequency Ordered Stop   09/16/18 0000  vancomycin (VANCOCIN) 500 mg in sodium chloride 0.9 % 100 mL IVPB     500 mg 100 mL/hr over 60 Minutes Intravenous Every 24 hours 09/15/18 0103     09/15/18 1800  vancomycin (VANCOCIN) 1,250 mg in sodium chloride 0.9 % 250 mL IVPB  Status:  Discontinued     1,250 mg 166.7 mL/hr over 90 Minutes Intravenous Every 24 hours 09/08/2018 1817 09/15/18 0102   09/15/18 1000  ivermectin (STROMECTOL) tablet 9 mg  Status:  Discontinued     9 mg Oral  Once 09/10/2018 2338 09/15/18 1126   09/15/18 0800  ceFEPIme (MAXIPIME) 2 g in sodium chloride 0.9 % 100 mL IVPB     2 g 200 mL/hr over 30 Minutes Intravenous Every 12 hours 09/15/18 0000     09/15/18 0200  ceFEPIme (MAXIPIME) 2 g in sodium chloride  0.9 % 100 mL IVPB  Status:  Discontinued     2 g 200 mL/hr over 30 Minutes Intravenous Every 8 hours 09/05/2018 1817 09/15/18 0000   09/12/2018 2345  ivermectin (STROMECTOL) tablet 9 mg  Status:  Discontinued     9 mg Oral  Once 08/23/2018 2334 09/13/2018 2338   09/01/2018 1830  vancomycin (VANCOCIN) 1,500 mg in sodium chloride 0.9 % 500 mL IVPB     1,500 mg 250 mL/hr over 120 Minutes Intravenous  Once 09/02/2018 1817 09/15/18 0217   09/19/2018 1815  vancomycin (VANCOCIN) IVPB 1000 mg/200 mL premix  Status:  Discontinued     1,000 mg 200 mL/hr over 60 Minutes Intravenous  Once 08/25/2018 1809 09/15/2018 1817   08/22/2018 1815  ceFEPIme (MAXIPIME) 2 g in sodium chloride 0.9 % 100 mL IVPB     2 g 200 mL/hr over 30 Minutes Intravenous  Once 09/02/2018 1809 08/26/2018 2330   08/27/2018 1700  cefTRIAXone (ROCEPHIN) 2 g in sodium chloride 0.9 % 100 mL IVPB  Status:  Discontinued     2 g 200 mL/hr over 30 Minutes Intravenous Every 24 hours 08/21/2018 1650 08/21/2018 1814   09/15/2018 1700  azithromycin (ZITHROMAX) 500 mg in sodium chloride 0.9 % 250 mL IVPB     500 mg 250 mL/hr over 60 Minutes Intravenous Every 24 hours 09/04/2018 1650  Subjective/Interval History: Patient is combative this morning.  He does not answer any questions.  He does open his eyes and looks at me briefly.  But he just wants to be left alone.    Assessment/Plan:  Severe sepsis/healthcare associated pneumonia Patient was noted to be hypotensive and hypothermic with altered mental status.  His lactic acid level was 2.  He had leukocytosis.  He had an elevated procalcitonin level.  Hypotension could also have been due to changes in his antihypertensive regimen recently.  He was apparently started on clonidine patch recently. Patient was started on broad-spectrum antibiotics with vancomycin and cefepime and azithromycin.  We will continue these for now.  Follow-up on blood cultures.  Lactic acid level has improved to 1.3.  WBC remains elevated.   Procalcitonin level improved from 6.76 to 4.18 this morning.  Blood pressure has improved as well.  Cut back on IV fluids.  He has not required any pressors overnight.  Monitor him off of antihypertensives.  Hypothermia Most likely due to sepsis.  TSH 2.01.  Cortisol level 8.7.  He is currently on Solu-Medrol.  Continue warming blanket for now.  Positive for COVID-19 Apparently he was in the COVID-19 unit at the skilled nursing facility.  Unclear how long ago he was tested positive for this virus.  His current symptoms however seem to be more related to a bacterial infection rather than COVID-19.  He is on steroids.  I think it is okay to hold off on Remdesivir especially as he is not hypoxic.  Continue to monitor clinically.  Acute metabolic encephalopathy Patient apparently has confusion and agitation and combativeness at baseline.  Seems to be close to it now.  His CT scan of the head was unremarkable for any acute process.  Remote infarcts were noted.  Atrophy was noted..  Continue to monitor.  History of cerebral palsy Stable.  History of cognitive impairment/dementia See above.  On Rivastigmine which is being continued.  Essential hypertension Antihypertensives on hold due to hypotension.  Diabetes mellitus type 2 Not noted to be on any medications for the same.  Monitor CBGs.  SSI.  Seizure disorder Noted to be on Depakote, phenytoin and Keppra.  These are being given intravenously for now.  Phenytoin level was 11.4 yesterday.  Valproic acid level was 48 yesterday.  No seizure activity noted.  Normocytic anemia Hemoglobin is stable.  No evidence of overt bleeding.  Poor IV access Patient has a central line in his femoral vein placed in the ER yesterday.  This will need to be removed once IV access is obtained.  Midline has been ordered.   DVT Prophylaxis: Subcutaneous heparin PUD Prophylaxis: Start Pepcid Code Status: DNR Family Communication: Could not reach patient's  brother on either of the 2 numbers listed in the chart.  We will keep trying. Disposition Plan: Seems to be stable for transfer to progressive care unit   Medications:  Scheduled:  chlorhexidine  15 mL Mouth Rinse BID   [START ON 09/16/2018] Chlorhexidine Gluconate Cloth  6 each Topical Q0600   cholecalciferol  2,000 Units Oral Daily   heparin injection (subcutaneous)  7,500 Units Subcutaneous Q8H   mouth rinse  15 mL Mouth Rinse BID   methylPREDNISolone (SOLU-MEDROL) injection  60 mg Intravenous Daily   rivastigmine  4.6 mg Transdermal Daily   Continuous:  azithromycin Stopped (08/29/2018 1842)   ceFEPime (MAXIPIME) IV 2 g (09/15/18 0820)   lactated ringers 50 mL/hr at 09/15/18 1119   levETIRAcetam 500 mg (  09/15/18 1122)   phenytoin (DILANTIN) IV 200 mg (09/15/18 1017)   valproate sodium 750 mg (09/15/18 0944)   [START ON 09/16/2018] vancomycin     ZOX:[id number], ondansetron **OR** ondansetron (ZOFRAN) IV, senna-docusate, sodium phosphate, sorbitol   Objective:  Vital Signs  Vitals:   09/15/18 0800 09/15/18 0831 09/15/18 0900 09/15/18 1100  BP: (!) 110/97  100/63 (!) 104/57  Pulse:   69 71  Resp: Temp:  (!) 94 F (34.4 C)  97.6 F (36.4 C)  TempSrc:  Axillary  Axillary  SpO2:   99% 94%  Weight:      Height:        Intake/Output Summary (Last 24 hours) at 09/15/2018 1153 Last data filed at 09/15/2018 1122 Gross per 24 hour  Intake 2441.29 ml  Output 0 ml  Net 2441.29 ml   Filed Weights   10-11-2018 1725 10-11-2018 2300  Weight: 64.9 kg 42.1 kg    General appearance: Eyes closed.  Does open his eyes when he is touched.  Becomes agitated. Resp: Limited exam as patient was uncooperative.  But appears to have good air entry bilaterally Cardio: S1-S2 is normal regular.  No S3-S4.  No rubs murmurs or bruit GI: Abdomen is soft.  Nontender nondistended.  Bowel sounds are present normal.  No masses organomegaly Extremities: No edema.  Full range of  motion of lower extremities. Neurologic: Confused/disoriented.  No obvious neurological deficits.   Lab Results:  Data Reviewed: I have personally reviewed following labs and imaging studies  CBC: Recent Labs  Lab 2018/10/11 1530 09/15/18 0450  WBC 26.5* 22.9*  NEUTROABS 23.6* 21.9*  HGB 9.4* 9.4*  HCT 30.0* 30.1*  MCV 98.0 98.4  PLT 210 212    Basic Metabolic Panel: Recent Labs  Lab Oct 11, 2018 1530 09/15/18 0450  NA 141 143  K 5.0 4.3  CL 104 108  CO2 26 25  GLUCOSE 108* 93  BUN 23 19  CREATININE 0.93 0.70  CALCIUM 9.3 8.3*    GFR: Estimated Creatinine Clearance: 46 mL/min (by C-G formula based on SCr of 0.7 mg/dL).  Liver Function Tests: Recent Labs  Lab 11-Oct-2018 1530 09/15/18 0450  AST 25 19  ALT 18 16  ALKPHOS 117 113  BILITOT 0.3 0.3  PROT 6.5 5.5*  ALBUMIN 3.1* 2.4*     Recent Labs  Lab 2018/10/11 1530  AMMONIA 49*    Coagulation Profile: Recent Labs  Lab 2018/10/11 1530 09/15/18 0450  INR 1.1 1.2     Thyroid Function Tests: Recent Labs    10-11-18 1530  TSH 2.019     Recent Results (from the past 240 hour(s))  Blood Culture (routine x 2)     Status: None (Preliminary result)   Collection Time: 11-Oct-2018  3:30 PM   Specimen: BLOOD  Result Value Ref Range Status   Specimen Description   Final    BLOOD BLOOD LEFT HAND Performed at [facility], [address], [place], [place] [address]    Special Requests   Final    BOTTLES DRAWN AEROBIC AND ANAEROBIC Blood Culture adequate volume Performed at [facility], [address] [place], [place] [address]    Culture   Final    NO GROWTH < 12 HOURS Performed at [facility], [address], [place], [place] [address]    Report Status PENDING  Incomplete  Blood Culture (routine x 2)     Status: None (Preliminary result)  Collection Time: 07-21-18  3:30 PM   Specimen: BLOOD  Result Value Ref Range Status   Specimen Description   Final     BLOOD BLOOD LEFT FOREARM Performed at [facility], [address] [place], [place] [address]    Special Requests   Final    BOTTLES DRAWN AEROBIC AND ANAEROBIC Blood Culture adequate volume Performed at [facility], [address] [place], [place] [address]    Culture   Final    NO GROWTH < 12 HOURS Performed at [facility], [address] [place], [place] [address]    Report Status PENDING  Incomplete  SARS Coronavirus 2 (CEPHEID- Performed in [facility] hospital lab), Hosp Order     Status: Abnormal   Collection Time: 07-21-18  4:40 PM   Specimen: Nasopharyngeal Swab  Result Value Ref Range Status   SARS Coronavirus 2 POSITIVE (A) NEGATIVE Final    Comment: CRITICAL RESULT CALLED TO, READ BACK BY AND VERIFIED WITH: Antony OdeaOYCE BARHAM,RN [id number]/04/15 @ 1804 BY J SCOTTON (NOTE) If result is NEGATIVE SARS-CoV-2 target nucleic acids are NOT DETECTED. The SARS-CoV-2 RNA is generally detectable in upper and lower  respiratory specimens during the acute phase of infection. The lowest  concentration of SARS-CoV-2 viral copies this assay can detect is 250  copies / mL. A negative result does not preclude SARS-CoV-2 infection  and should not be used as the sole basis for treatment or other  patient management decisions.  A negative result may occur with  improper specimen collection / handling, submission of specimen other  than nasopharyngeal swab, presence of viral mutation(s) within the  areas targeted by this assay, and inadequate number of viral copies  (<250 copies / mL). A negative result must be combined with clinical  observations, patient history, and epidemiological information. If result is POSITIVE SARS-CoV-2 target nucleic acids  are DETECTED. The SARS-CoV-2 RNA is generally detectable in upper and lower  respiratory specimens during the acute phase of infection.  Positive  results are indicative of active infection with SARS-CoV-2.   Clinical  correlation with patient history and other diagnostic information is  necessary to determine patient infection status.  Positive results do  not rule out bacterial infection or co-infection with other viruses. If result is PRESUMPTIVE POSTIVE SARS-CoV-2 nucleic acids MAY BE PRESENT.   A presumptive positive result was obtained on the submitted specimen  and confirmed on repeat testing.  While 2019 novel coronavirus  (SARS-CoV-2) nucleic acids may be present in the submitted sample  additional confirmatory testing may be necessary for epidemiological  and / or clinical management purposes  to differentiate between  SARS-CoV-2 and other Sarbecovirus currently known to infect humans.  If clinically indicated additional testing with an alternate test  methodol ogy [phone]([id number]) is advised. The SARS-CoV-2 RNA is generally  detectable in upper and lower respiratory specimens during the acute  phase of infection. The expected result is Negative. Fact Sheet for Patients:  BoilerBrush.com.cyhttps://www.fda.gov/media/136312/download Fact Sheet for Healthcare Providers: https://pope.com/https://www.fda.gov/media/136313/download This test is not yet approved or cleared by the [place] FDA and has been authorized for detection and/or diagnosis of SARS-CoV-2 by FDA under an Emergency Use Authorization (EUA).  This EUA will remain in effect (meaning this test can be used) for the duration of the COVID-19 declaration under Section 564(b)(1) of the Act, 21 U.S.C. section 360bbb-3(b)(1), unless the authorization is terminated or revoked sooner. Performed at [facility], [address] [place],  [place] [address]   MRSA PCR Screening     Status: None   Collection Time: 09/10/2018  6:18 PM   Specimen: Nasal Mucosa; Nasopharyngeal  Result Value Ref Range Status   MRSA by PCR NEGATIVE NEGATIVE Final    Comment:        The GeneXpert MRSA Assay (FDA approved for NASAL specimens only), is one component of  a comprehensive MRSA colonization surveillance program. It is not intended to diagnose MRSA infection nor to guide or monitor treatment for MRSA infections. Performed at [facility], [address] [place], [place] [address]   Respiratory Panel by PCR     Status: None   Collection Time: 09/19/2018  6:35 PM   Specimen: Nasal Mucosa; Respiratory  Result Value Ref Range Status   Adenovirus NOT DETECTED NOT DETECTED Final   Coronavirus 229E NOT DETECTED NOT DETECTED Final    Comment: (NOTE) The Coronavirus on the Respiratory Panel, DOES NOT test for the novel  Coronavirus (2019 nCoV)    Coronavirus HKU1 NOT DETECTED NOT DETECTED Final   Coronavirus NL63 NOT DETECTED NOT DETECTED Final   Coronavirus OC43 NOT DETECTED NOT DETECTED Final   Metapneumovirus NOT DETECTED NOT DETECTED Final   Rhinovirus / Enterovirus NOT DETECTED NOT DETECTED Final   Influenza A NOT DETECTED NOT DETECTED Final   Influenza B NOT DETECTED NOT DETECTED Final   Parainfluenza Virus 1 NOT DETECTED NOT DETECTED Final   Parainfluenza Virus 2 NOT DETECTED NOT DETECTED Final   Parainfluenza Virus 3 NOT DETECTED NOT DETECTED Final   Parainfluenza Virus 4 NOT DETECTED NOT DETECTED Final   Respiratory Syncytial Virus NOT DETECTED NOT DETECTED Final   Bordetella pertussis NOT DETECTED NOT DETECTED Final   Chlamydophila pneumoniae NOT DETECTED NOT DETECTED Final   Mycoplasma pneumoniae NOT DETECTED NOT DETECTED Final    Comment: Performed at [facility], [address] [place], [place] [address]      Radiology Studies: Ct Head Wo Contrast  Result Date: 08/23/2018 CLINICAL DATA:  78 year old male with altered mental status. EXAM: CT HEAD WITHOUT CONTRAST TECHNIQUE: Contiguous axial images were obtained from the base of the skull through the vertex without intravenous contrast. COMPARISON:  06/22/2017 and prior CTs FINDINGS: Brain: No evidence of acute infarction, hemorrhage, hydrocephalus,  extra-axial collection or mass lesion/mass effect. Atrophy, chronic small-vessel white matter ischemic changes and remote RIGHT frontal and LEFT occipital infarcts noted. Vascular: Carotid atherosclerotic calcifications again noted. Skull: Normal. Negative for fracture or focal lesion. Sinuses/Orbits: No acute abnormality Other: None IMPRESSION: 1. No evidence of acute intracranial abnormality 2. Atrophy, chronic small-vessel white matter ischemic changes and remote RIGHT frontal and LEFT occipital infarcts. Electronically Signed   By: Harmon PierJeffrey  Hu M.D.   On: 09/01/2018 16:51   Dg Chest Port 1 View  Result Date: 09/02/2018 CLINICAL DATA:  [facility], pt was in Covid Unit at facility. Unsure when of if pt was actually tested. There is no documentation regarding testing. EXAM: PORTABLE CHEST 1 VIEW COMPARISON:  04/03/2013 FINDINGS: Heart size is normal. There is mild atherosclerotic calcification of the thoracic aorta. There is faint patchy density at the LEFT lung base, consistent with early infiltrate. The RIGHT lung is clear. No pulmonary edema. IMPRESSION: Early LEFT lower lobe infiltrate. Electronically Signed   By: Norva PavlovElizabeth  Brown M.D.   On: 09/02/2018 16:46       LOS: 1 day   Osvaldo ShipperGokul Hali Balgobin  [facility] Pager on  [email or web address]  09/15/2018, 11:53 AM

## 2018-09-15 NOTE — Plan of Care (Signed)
Discussed plan of care in front of patient, bathed and called MD for something for anxiety with no evidence of learning at this time.  Attempted to call brother at home with no answer for updates.

## 2018-09-15 NOTE — Progress Notes (Signed)
Initial Nutrition Assessment  DOCUMENTATION CODES:   Underweight  INTERVENTION:   If diet unable to be advanced, recommend placement of feeding tube (Cortrak service available at [facility] 7/29)  Once diet advanced: -Ensure Enlive po TID, each supplement provides 350 kcal and 20 grams of protein -Multivitamin with minerals daily  NUTRITION DIAGNOSIS:   Increased nutrient needs related to acute illness as evidenced by estimated needs.  GOAL:   Patient will meet greater than or equal to 90% of their needs  MONITOR:   PO intake, Supplement acceptance, Labs, Weight trends, I & O's, Skin  REASON FOR ASSESSMENT:   Malnutrition Screening Tool    ASSESSMENT:   78 yr old resident of [facility] with a past medical history significant for DM II, Dementia, Cerebral Palsy, GERD, Cognitive impairment, Seizures, Speech impairment, Hypertension.  At the skilled nursing facility he was in the [place] unit. Admitted for severe sepsis. Last positive COVID-19 test : 7/25  **RD working remotely**  Patient with agitation, combativeness and not oriented.  Pt currently NPO.  Per review of home medications, nursing facility provides pt with Boost High Protein between meals. Recommend Ensure supplements once diet is advanced.   Per weight records, pt has lost weight since last recorded weights in 2015. Pt now underweight. Will continue to monitor weight trends.   Labs reviewed. Medications: Vitamin D tablet daily, Lactated Ringers infusion  NUTRITION - FOCUSED PHYSICAL EXAM:  Unable to perform -working remotely.  Diet Order:   Diet Order            Diet NPO time specified  Diet effective now              EDUCATION NEEDS:   No education needs have been identified at this time  Skin:  Skin Assessment: Skin Integrity Issues: Skin Integrity Issues:: Unstageable Unstageable: left foot, left ankle  Last BM:  PTA  Height:   Ht Readings from Last 1 Encounters:  09/06/2018 5\' 10"  (1.778  m)    Weight:   Wt Readings from Last 1 Encounters:  09/17/2018 42.1 kg    Ideal Body Weight:  75.5 kg  BMI:  Body mass index is 13.32 kg/m.  Estimated Nutritional Needs:   Kcal:  1800-2000  Protein:  80-90g  Fluid:  1.8L/day   Clayton Bibles, MS, RD, LDN Veteran Dietitian Pager: [phone] After Hours Pager: [phone]

## 2018-09-15 NOTE — Progress Notes (Signed)
Spoke with pts brother , sister Berenice Primas and [place] for updates. [place] states patient was on puree nectar thick diet at baseline . Ellamae Sia
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
## 2018-09-15 NOTE — Progress Notes (Signed)
Pharmacy Antibiotic Note  Wesley Sullivan is a 78 y.o. male admitted on 09/20/2018 with pneumonia.  Pharmacy has been consulted for vanc/cefepime dosing.  UPDATE:  - upon transfer to [place], it was noted that the pt listed weight is 42.1 kg. I spoke with RN who onfirmed that this is the correct updated weight. Therefore, will adjust antibiotic  Dosing.   Plan: 1) Vanc 1500mg  x 1 already given  then 500mg  IV q24hr - goal AUC 400-550 2) Cefepime 2g IV q12h  per current renal function   Height: 5\' 10"  (177.8 cm) Weight: 92 lb 13 oz (42.1 kg) IBW/kg (Calculated) : 73  Temp (24hrs), Avg:96.4 F (35.8 C), Min:94.1 F (34.5 C), Max:98.2 F (36.8 C)  Recent Labs  Lab 08/27/2018 1530  WBC 26.5*  CREATININE 0.93  LATICACIDVEN 1.3  2.0*    Estimated Creatinine Clearance: 39.6 mL/min (by C-G formula based on SCr of 0.93 mg/dL).    No Known Allergies   Thank you for allowing pharmacy to be a part of this patient's care.   Royetta Asal, PharmD, BCPS 09/15/2018 12:56 AM
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
## 2018-09-15 NOTE — Progress Notes (Signed)
Pt has been withdrawn most of morning and becomes physically aggressive when trying to provide care (IV,mouth care, temp). Pt has periods of lucidity and asks for water and says thank you. Attempted to call his brother and update with no answer at either number. Wesley Sullivan

## 2018-09-16 DIAGNOSIS — R636 Underweight: Secondary | ICD-10-CM

## 2018-09-16 DIAGNOSIS — R479 Unspecified speech disturbances: Secondary | ICD-10-CM | POA: Diagnosis present

## 2018-09-16 DIAGNOSIS — F03918 Unspecified dementia, unspecified severity, with other behavioral disturbance: Secondary | ICD-10-CM | POA: Diagnosis present

## 2018-09-16 DIAGNOSIS — G809 Cerebral palsy, unspecified: Secondary | ICD-10-CM | POA: Diagnosis present

## 2018-09-16 DIAGNOSIS — U071 COVID-19: Secondary | ICD-10-CM | POA: Diagnosis present

## 2018-09-16 DIAGNOSIS — T68XXXA Hypothermia, initial encounter: Secondary | ICD-10-CM | POA: Diagnosis present

## 2018-09-16 DIAGNOSIS — J690 Pneumonitis due to inhalation of food and vomit: Secondary | ICD-10-CM

## 2018-09-16 DIAGNOSIS — F0391 Unspecified dementia with behavioral disturbance: Secondary | ICD-10-CM | POA: Diagnosis present

## 2018-09-16 LAB — COMPREHENSIVE METABOLIC PANEL
ALT: 16 U/L (ref 0–44)
AST: 19 U/L (ref 15–41)
Albumin: 2.3 g/dL — ABNORMAL LOW (ref 3.5–5.0)
Alkaline Phosphatase: 133 U/L — ABNORMAL HIGH (ref 38–126)
Anion gap: 9 (ref 5–15)
BUN: 20 mg/dL (ref 8–23)
CO2: 28 mmol/L (ref 22–32)
Calcium: 8.6 mg/dL — ABNORMAL LOW (ref 8.9–10.3)
Chloride: 108 mmol/L (ref 98–111)
Creatinine, Ser: 0.61 mg/dL (ref 0.61–1.24)
GFR calc Af Amer: >60 mL/min (ref >60)
GFR calc non Af Amer: >60 mL/min (ref >60)
Glucose, Bld: 73 mg/dL (ref 70–99)
Potassium: 4.5 mmol/L (ref 3.5–5.1)
Sodium: 145 mmol/L (ref 135–145)
Total Bilirubin: 0.3 mg/dL (ref 0.3–1.2)
Total Protein: 5.2 g/dL — ABNORMAL LOW (ref 6.5–8.1)

## 2018-09-16 LAB — FERRITIN: Ferritin: 188 ng/mL (ref 24–336)

## 2018-09-16 LAB — CBC
HCT: 28.6 % — ABNORMAL LOW (ref 39.0–52.0)
Hemoglobin: 9.1 g/dL — ABNORMAL LOW (ref 13.0–17.0)
MCH: 31.7 pg (ref 26.0–34.0)
MCHC: 31.8 g/dL (ref 30.0–36.0)
MCV: 99.7 fL (ref 80.0–100.0)
Platelets: 188 10*3/uL (ref 150–400)
RBC: 2.87 MIL/uL — ABNORMAL LOW (ref 4.22–5.81)
RDW: 13.9 % (ref 11.5–15.5)
WBC: 16.1 10*3/uL — ABNORMAL HIGH (ref 4.0–10.5)
nRBC: 0 % (ref 0.0–0.2)

## 2018-09-16 LAB — C-REACTIVE PROTEIN: CRP: 5.3 mg/dL — ABNORMAL HIGH (ref <1.0)

## 2018-09-16 LAB — D-DIMER, QUANTITATIVE: D-Dimer, Quant: 0.59 ug/mL-FEU — ABNORMAL HIGH (ref 0.00–0.50)

## 2018-09-16 MED ORDER — LORAZEPAM 2 MG/ML IJ SOLN
2.0000 mg | INTRAMUSCULAR | Status: DC | PRN
  Administered 2018-09-16 – 2018-09-19 (×5): 2 mg via INTRAVENOUS
  Filled 2018-09-16 (×5): qty 1

## 2018-09-16 MED ORDER — ENOXAPARIN SODIUM 40 MG/0.4ML ~~LOC~~ SOLN
40.0000 mg | SUBCUTANEOUS | Status: DC
  Administered 2018-09-16 – 2018-09-18 (×3): 40 mg via SUBCUTANEOUS
  Filled 2018-09-16 (×2): qty 0.4

## 2018-09-16 MED ORDER — RESOURCE THICKENUP CLEAR PO POWD
ORAL | Status: DC | PRN
  Administered 2018-09-16: 17:00:00 via ORAL
  Filled 2018-09-16: qty 125

## 2018-09-16 MED ORDER — LORAZEPAM 2 MG/ML IJ SOLN
INTRAMUSCULAR | Status: AC
  Filled 2018-09-16: qty 1

## 2018-09-16 NOTE — Progress Notes (Signed)
Since pt is on floor status now, ok to change heparin to standard dose lovenox. Dc vanc due to MRSA PCR neg per Dr. Lyman Speller.   Onnie Boer, PharmD, BCIDP, AAHIVP, CPP Infectious Disease Pharmacist 09/16/2018 8:53 AM

## 2018-09-16 NOTE — Progress Notes (Addendum)
PROGRESS NOTE  Wesley Sullivan DBZ:[id number] DOB: Oct 22, 1940 DOA: 08/28/2018 PCP: Lucianne Lei, MD  HPI/Recap of past 24 hours: 78 yr old resident of [facility] with a past medical history significant for DM II, Dementia, Cerebral Palsy, GERD, Cognitive impairment, Seizures, Speech impairment, Hypertension.  At the skilled nursing facility he was in the [place] unit.  At baseline patient is known to have episodes of agitation and combativeness.  On the day of admission he was noted to be hypertensive.  Apparently was complaining of a headache.  New clonidine patch was prescribed and he was given nitroglycerin.  After that apparently he had a drop in his blood pressure and became nonverbal.  In the emergency department he was found to be hypothermic.  CT scan of the head did not show any acute findings.  A left-sided pneumonia was noted on chest x-ray.  Patient was placed on antibiotics.  Central line was placed.  He was admitted to the intensive care unit.   Following admission, patient was treated with IV fluids and antibiotics.  Lactic acid level by 7/26 had normalized.  Procalcitonin trended downward.  Did not require pressor support.  Patient was stabilized and able to be moved out of the ICU.  He has remained severely agitated at times getting quite aggressive with staff.  He required both restraints and medications initially.  This morning, white blood cell count continues to further improve, down to 16.1.  Lactic acid level staying normal.  Patient himself speaks with garbled speech, does not appear to follow commands.  Unclear if this is his baseline  Assessment/Plan: Active Problems:   Normocytic anemia: Hemoglobin remained stable, no evidence of bleeding.    Seizure disorder [facility]): Noted to be on Depakote, Dilantin and Keppra.  Been given IV for now.  No seizure activity noted.    HTN (hypertension): Initially held off on his antihypertensives.  Resume once blood pressure starts to  elevate.    Underweight: Patient meets criteria as evidenced with increased nutrient needs related to acute illness as evidenced by estimated needs, BMI of 16.64.  Seen by nutrition.  Seen by speech therapy on 7/27 and felt to be at high risk for silent aspiration although no immediate signs of aspiration.  Patient placed on dysphagia 1 diet with nectar thick liquid.    Severe sepsis with acute organ dysfunction (HCC) secondary to healthcare associated pneumonia, highly suspicious for aspiration: Patient met criteria for sepsis on admission with lactic acidosis, leukocytosis, elevated procalcitonin level and noted to be hypotensive and hypothermic with altered mental status.  On broad-spectrum antibiotics which we will continue until white blood cell count normalized.  Lactic acid level normalized, so sepsis felt to be resolved.  Have cut back on IV fluids now the blood pressure has remained stable.  Did not require pressor support.  Holding off on antihypertensives for now.  Follow-up procalcitonin level.  Seen by speech therapy, chronic history of dysphagia.  Placed on dysphagia 1 diet with nectar thick liquids     Cerebral palsy ([place]): Stable    Senile dementia with behavioral disturbance (HCC)/psychosis: Apparently, patient has confusion and agitation and combativeness at baseline.  CT scan of the head unremarkable for anything acute.:  Continue Reifsteck mean.    Hypothermia: Resolved.  Felt to be secondary to sepsis. COVID positive: Positive COVID test here had apparently been at the [place] unit at skilled nursing facility and was unclear how long ago he tested positive.  Continue steroids.  He  is not hypoxic, so okay to hold off on Remdisivir for now.  Does not meet criteria for Actemra.  Code Status: DNR  Family Communication: Called brother's number in chart, no answer  Disposition Plan: Stable, discharged back to skilled nursing once white blood cell count normalized and infection felt to  be stable   Consultants:  None  Procedures:  None*   Antimicrobials:  IV vancomycin 7/25- 7/27  IV cefepime 7/25-present  DVT prophylaxis: Subcu heparin   Objective: Vitals:   09/16/18 1100 09/16/18 1209  BP:  (!) 119/98  Pulse: (!) 46 (!) 55  Resp: 18 (!) 9  Temp:    SpO2: 96% 100%    Intake/Output Summary (Last 24 hours) at 09/16/2018 1431 Last data filed at 09/16/2018 1221 Gross per 24 hour  Intake 488 ml  Output -  Net 488 ml   Filed Weights   09/20/2018 1725 09/11/2018 2300 09/16/18 0334  Weight: 64.9 kg 42.1 kg 52.6 kg   Body mass index is 16.64 kg/m.  Exam:   General: Awake, does not follow commands  HEENT: Poor dentition, mucous membranes appear slightly dry,  Neck: Supple, no JVD  Cardiovascular: Regular rate and rhythm, S1-S2, borderline bradycardia  Respiratory: Clear to auscultation bilaterally, decreased at bases  Abdomen: Soft, nontender appearing, nondistended, hypoactive bowel sounds  Musculoskeletal: No clubbing or cyanosis or edema  Skin: No visible skin breaks, tears or lesions.  Patient becomes agitated with attempt to move and look at his backside  Neuro: Chronic cerebral palsy  Psychiatry: Chronically with psychosis   Data Reviewed: CBC: Recent Labs  Lab 09/02/2018 1530 09/15/18 0450 09/16/18 0515  WBC 26.5* 22.9* 16.1*  NEUTROABS 23.6* 21.9*  --   HGB 9.4* 9.4* 9.1*  HCT 30.0* 30.1* 28.6*  MCV 98.0 98.4 99.7  PLT 210 212 081   Basic Metabolic Panel: Recent Labs  Lab 09/03/2018 1530 09/15/18 0450 09/16/18 0515  NA 141 143 145  K 5.0 4.3 4.5  CL 104 108 108  CO2 _0 GLUCOSE 108* 93 73  BUN _1 CREATININE 0.93 0.70 0.61  CALCIUM 9.3 8.3* 8.6*   GFR: Estimated Creatinine Clearance: 57.5 mL/min (by C-G formula based on SCr of 0.61 mg/dL). Liver Function Tests: Recent Labs  Lab 08/21/2018 1530 09/15/18 0450 09/16/18 0515  AST _2 ALT _3 ALKPHOS 117 113 133*  BILITOT 0.3 0.3 0.3   PROT 6.5 5.5* 5.2*  ALBUMIN 3.1* 2.4* 2.3*   No results for input(s): LIPASE, AMYLASE in the last 168 hours. Recent Labs  Lab 09/07/2018 1530  AMMONIA 49*   Coagulation Profile: Recent Labs  Lab 09/13/2018 1530 09/15/18 0450  INR 1.1 1.2   Cardiac Enzymes: No results for input(s): CKTOTAL, CKMB, CKMBINDEX, TROPONINI in the last 168 hours. BNP (last 3 results) No results for input(s): PROBNP in the last 8760 hours. HbA1C: No results for input(s): HGBA1C in the last 72 hours. CBG: No results for input(s): GLUCAP in the last 168 hours. Lipid Profile: No results for input(s): CHOL, HDL, LDLCALC, TRIG, CHOLHDL, LDLDIRECT in the last 72 hours. Thyroid Function Tests: Recent Labs    09/01/2018 1530  TSH 2.019   Anemia Panel: Recent Labs    09/16/18 0515  FERRITIN 188   Urine analysis:    Component Value Date/Time   COLORURINE YELLOW 08/22/2018 1530   APPEARANCEUR CLEAR 08/23/2018 1530   LABSPEC 1.018 09/05/2018 1530   PHURINE 6.0 09/03/2018 1530  GLUCOSEU NEGATIVE 09/15/2018 1530   HGBUR NEGATIVE 08/22/2018 1530   [place] 09/05/2018 1530   [place] 08/24/2018 1530   PROTEINUR NEGATIVE 09/15/2018 1530   UROBILINOGEN 1.0 03/31/2013 1511   NITRITE NEGATIVE 09/13/2018 1530   LEUKOCYTESUR NEGATIVE 08/26/2018 1530   Sepsis Labs: _0 (procalcitonin:4,lacticidven:4)  ) Recent Results (from the past 240 hour(s))  Blood Culture (routine x 2)     Status: None (Preliminary result)   Collection Time: 09/06/2018  3:30 PM   Specimen: BLOOD  Result Value Ref Range Status   Specimen Description   Final    BLOOD BLOOD LEFT HAND Performed at [facility], [place] [address], [place], [place] [address]    Special Requests   Final    BOTTLES DRAWN AEROBIC AND ANAEROBIC Blood Culture adequate volume Performed at [facility] [address], [place], [place] [address]    Culture   Final    NO GROWTH 2 DAYS Performed  at [facility] [address], [place], [place] [address]    Report Status PENDING  Incomplete  Blood Culture (routine x 2)     Status: None (Preliminary result)   Collection Time: 09/08/2018  3:30 PM   Specimen: BLOOD  Result Value Ref Range Status   Specimen Description   Final    BLOOD BLOOD LEFT FOREARM Performed at [place] [facility] [address], [place], [place] [address]    Special Requests   Final    BOTTLES DRAWN AEROBIC AND ANAEROBIC Blood Culture adequate volume Performed at [facility] [address], [place], [place] [address]    Culture   Final    NO GROWTH 2 DAYS Performed at [facility] [place] [address], [place], [place] [address]    Report Status PENDING  Incomplete  Urine culture     Status: None   Collection Time: 09/02/2018  3:30 PM   Specimen: In/Out Cath Urine  Result Value Ref Range Status   Specimen Description   Final    IN/OUT CATH URINE Performed at [facility] [address], [place], [place] [address]    Special Requests   Final    Normal Performed at [facility], [place] [address] [place], [place] [address]    Culture   Final    NO GROWTH Performed at [facility], [place] [address], [place], [place] [address]    Report Status 09/15/2018 FINAL  Final  SARS Coronavirus 2 (CEPHEID- Performed in [place] hospital lab), Hosp Order     Status: Abnormal   Collection Time: 09/06/2018  4:40 PM   Specimen: Nasopharyngeal Swab  Result Value Ref Range Status   SARS Coronavirus 2 POSITIVE (A) NEGATIVE Final    Comment: CRITICAL RESULT CALLED TO, READ BACK BY AND VERIFIED WITH: Donley Redder [id number] @ 5681 BY J SCOTTON (NOTE) If result is NEGATIVE SARS-CoV-2 target nucleic acids are NOT DETECTED. The SARS-CoV-2 RNA is generally detectable in upper and lower  respiratory specimens during the acute phase of infection. The lowest  concentration of SARS-CoV-2 viral  copies this assay can detect is 250  copies / mL. A negative result does not preclude SARS-CoV-2 infection  and should not be used as the sole basis for treatment or other  patient management decisions.  A negative result may occur with  improper specimen collection / handling, submission of specimen other  than nasopharyngeal swab, presence of viral mutation(s) within  the  areas targeted by this assay, and inadequate number of viral copies  (<250 copies / mL). A negative result must be combined with clinical  observations, patient history, and epidemiological information. If result is POSITIVE SARS-CoV-2 target nucleic acids  are DETECTED. The SARS-CoV-2 RNA is generally detectable in upper and lower  respiratory specimens during the acute phase of infection.  Positive  results are indicative of active infection with SARS-CoV-2.  Clinical  correlation with patient history and other diagnostic information is  necessary to determine patient infection status.  Positive results do  not rule out bacterial infection or co-infection with other viruses. If result is PRESUMPTIVE POSTIVE SARS-CoV-2 nucleic acids MAY BE PRESENT.   A presumptive positive result was obtained on the submitted specimen  and confirmed on repeat testing.  While 2019 novel coronavirus  (SARS-CoV-2) nucleic acids may be present in the submitted sample  additional confirmatory testing may be necessary for epidemiological  and / or clinical management purposes  to differentiate between  SARS-CoV-2 and other Sarbecovirus currently known to infect humans.  If clinically indicated additional testing with an alternate test  methodol ogy [phone]) is advised. The SARS-CoV-2 RNA is generally  detectable in upper and lower respiratory specimens during the acute  phase of infection. The expected result is Negative. Fact Sheet for Patients:  StrictlyIdeas.no Fact Sheet for Healthcare Providers:  BankingDealers.co.za This test is not yet approved or cleared by the [place] FDA and has been authorized for detection and/or diagnosis of SARS-CoV-2 by FDA under an Emergency Use Authorization (EUA).  This EUA will remain in effect (meaning this test can be used) for the duration of the COVID-19 declaration under Section 564(b)(1) of the Act, 21 U.S.C. section 360bbb-3(b)(1), unless the authorization is terminated or revoked sooner. Performed at [facility], [place] [address], [place], [place] [address]   MRSA PCR Screening     Status: None   Collection Time: 08/27/2018  6:18 PM   Specimen: Nasal Mucosa; Nasopharyngeal  Result Value Ref Range Status   MRSA by PCR NEGATIVE NEGATIVE Final    Comment:        The GeneXpert MRSA Assay (FDA approved for NASAL specimens only), is one component of a comprehensive MRSA colonization surveillance program. It is not intended to diagnose MRSA infection nor to guide or monitor treatment for MRSA infections. Performed at [facility], [place] [address], [place], [place] [address]   Respiratory Panel by PCR     Status: None   Collection Time: 09/02/2018  6:35 PM   Specimen: Nasal Mucosa; Respiratory  Result Value Ref Range Status   Adenovirus NOT DETECTED NOT DETECTED Final   Coronavirus 229E NOT DETECTED NOT DETECTED Final    Comment: (NOTE) The Coronavirus on the Respiratory Panel, DOES NOT test for the novel  Coronavirus (2019 nCoV)    Coronavirus HKU1 NOT DETECTED NOT DETECTED Final   Coronavirus NL63 NOT DETECTED NOT DETECTED Final   Coronavirus OC43 NOT DETECTED NOT DETECTED Final   Metapneumovirus NOT DETECTED NOT DETECTED Final   Rhinovirus / Enterovirus NOT DETECTED NOT DETECTED Final   Influenza A NOT DETECTED NOT DETECTED Final   Influenza B NOT DETECTED NOT DETECTED Final   Parainfluenza Virus 1 NOT DETECTED NOT DETECTED Final   Parainfluenza Virus 2 NOT DETECTED NOT  DETECTED Final   Parainfluenza Virus 3 NOT DETECTED NOT DETECTED Final   Parainfluenza Virus 4 NOT DETECTED NOT DETECTED Final   Respiratory Syncytial Virus NOT  DETECTED NOT DETECTED Final   Bordetella pertussis NOT DETECTED NOT DETECTED Final   Chlamydophila pneumoniae NOT DETECTED NOT DETECTED Final   Mycoplasma pneumoniae NOT DETECTED NOT DETECTED Final    Comment: Performed at [facility], [place] [address] [place], [place] [address]      Studies: No results found.  Scheduled Meds: . chlorhexidine  15 mL Mouth Rinse BID  . Chlorhexidine Gluconate Cloth  6 each Topical Q0600  . cholecalciferol  2,000 Units Oral Daily  . enoxaparin (LOVENOX) injection  40 mg Subcutaneous Q24H  . LORazepam      . mouth rinse  15 mL Mouth Rinse BID  . methylPREDNISolone (SOLU-MEDROL) injection  60 mg Intravenous Daily  . rivastigmine  4.6 mg Transdermal Daily  . sodium chloride flush  10-40 mL Intracatheter Q12H    Continuous Infusions: . azithromycin 500 mg (09/15/18 1800)  . ceFEPime (MAXIPIME) IV 2 g (09/16/18 0810)  . lactated ringers 50 mL/hr at 09/15/18 1119  . levETIRAcetam 500 mg (09/16/18 1241)  . phenytoin (DILANTIN) IV 200 mg (09/16/18 1146)  . valproate sodium 750 mg (09/16/18 1052)     LOS: 2 days     Annita Brod, MD [facility]  To reach me or the doctor on call, go to: [email or web address] Password [email or web address]  09/16/2018, 2:31 PM

## 2018-09-16 NOTE — Progress Notes (Addendum)
   09/16/18 0800  SLP Visit Information  SLP Received On 09/16/18  General Information  HPI 78 yr old resident of [facility] with a past medical history significant for DM II, Dementia, Cerebral Palsy, GERD, Cognitive impairment, Seizures, Speech impairment, Hypertension.  At the skilled nursing facility he was in the [place] unit.  At baseline patient is known to have episodes of agitation and combativeness.  On the day of admission he was noted to be hypertensive.   Unclear how long ago he was tested positive for this virus.  His current symptoms however seem to be more related to a bacterial infection rather than COVID-19. 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 Suspected to be close to his baseline per MD. Per note in chart family reports he consumed puree and nectar thick liquids at baseline. Pt has a history of chronic dysphagia, last MBS in 2015 showing silent aspiration of all liquid textures. Pt has a large osteophyte impacting swallow function. Least quanitity of aspiration occurred with nectar and puree with known risk, though pt has apparently tolerated this diet.   Type of Study Bedside Swallow Evaluation  Previous Swallow Assessment see HPI  Diet Prior to this Study NPO  Temperature Spikes Noted No  Respiratory Status Room air  History of Recent Intubation No  Behavior/Cognition Alert;Cooperative  Oral Cavity Assessment WFL  Oral Care Completed by SLP No  Vision Functional for self-feeding  Self-Feeding Abilities Able to feed self;Needs assist  Patient Positioning Upright in bed  Baseline Vocal Quality Normal  Volitional Cough Cognitively unable to elicit  Volitional Swallow Unable to elicit  Oral Motor/Sensory Function  Overall Oral Motor/Sensory Function WFL  Thin Liquid  Thin Liquid NT  Nectar Thick Liquid  Nectar Thick Liquid Impaired  Presentation Cup;Straw  Pharyngeal Phase Impairments Multiple swallows  Puree  Puree Impaired  Pharyngeal Phase Impairments Multiple swallows  Solid   Solid NT  SLP Assessment  Clinical Impression Statement (ACUTE ONLY) Pt is alert and calm, able to participate in evaluation and self feed if given a cup. He exhibits signs of dysphagia consistent with his reported baseline function on last MBS. There are multiple audible swallows but no immediate signs of aspiration Pt is at high risk of silent aspiration, but has tolerated puree and nectar thick liquids for years. Given that pt appears to have returned to baseline, will resume prior diet. No SLP f/u needed at this time.   SLP Visit Diagnosis Dysphagia, oropharyngeal phase (R13.12)  Impact on safety and function Severe aspiration risk  Other Related Risk Factors History of dysphagia;Cognitive impairment  Swallow Evaluation Recommendations  SLP Diet Recommendations Dysphagia 1 (Puree);Nectar-thick liquid  Liquid Administration via Cup;Straw  Medication Administration Crushed with puree  Supervision Staff to assist with self feeding  Compensations Minimize environmental distractions;Slow rate;Small sips/bites  Postural Changes Seated upright at 90 degrees  Treatment Plan  Oral Care Recommendations Oral care BID  Treatment Recommendations No treatment recommended at this time  Follow up Recommendations None  Individuals Consulted  Consulted and Agree with Results and Recommendations Patient  SLP Time Calculation  SLP Start Time (ACUTE ONLY) 1200  SLP Stop Time (ACUTE ONLY) 1215  SLP Time Calculation (min) (ACUTE ONLY) 15 min  SLP Evaluations  $ SLP Speech Visit 1 Visit  SLP Evaluations  $BSS Swallow 1 Procedure

## 2018-09-16 NOTE — Progress Notes (Signed)
Pts sister & brother Izell [place]) called the unit  for an update and to request a visit with Mr Potenza. I explained visitation policy of 1 family member for 10 minutes with prior  approval when death was deemed imminent. Explained Tomorrow would be the best to plan for due to needing to relocate pt. Izell [place] will call back once they determine a time that would allow [place] a few hours to have all this set up.

## 2018-09-17 LAB — CBC
HCT: 30.6 % — ABNORMAL LOW (ref 39.0–52.0)
Hemoglobin: 9.6 g/dL — ABNORMAL LOW (ref 13.0–17.0)
MCH: 30.6 pg (ref 26.0–34.0)
MCHC: 31.4 g/dL (ref 30.0–36.0)
MCV: 97.5 fL (ref 80.0–100.0)
Platelets: 216 10*3/uL (ref 150–400)
RBC: 3.14 MIL/uL — ABNORMAL LOW (ref 4.22–5.81)
RDW: 13.7 % (ref 11.5–15.5)
WBC: 7.8 10*3/uL (ref 4.0–10.5)
nRBC: 0 % (ref 0.0–0.2)

## 2018-09-17 LAB — CREATININE, SERUM
Creatinine, Ser: 0.58 mg/dL — ABNORMAL LOW (ref 0.61–1.24)
GFR calc Af Amer: >60 mL/min (ref >60)
GFR calc non Af Amer: >60 mL/min (ref >60)

## 2018-09-17 LAB — HEMOGLOBIN A1C
Hgb A1c MFr Bld: 5.5 % (ref 4.8–5.6)
Mean Plasma Glucose: 111.15 mg/dL

## 2018-09-17 NOTE — Progress Notes (Signed)
PROGRESS NOTE  Wesley Sullivan DDU:[id number] DOB: [birth year] DOA: 09/19/2018 PCP: Lucianne Lei, MD  HPI/Recap of past 24 hours: 78 yr old resident of [place] with a past medical history significant for DM II, Dementia, Cerebral Palsy, GERD, Cognitive impairment, Seizures, Speech impairment, Hypertension.  At the skilled nursing facility he was in the [place] unit.  At baseline patient is known to have episodes of agitation and combativeness.  On the day of admission he was noted to be hypertensive.  Apparently was complaining of a headache.  New clonidine patch was prescribed and he was given nitroglycerin.  After that apparently he had a drop in his blood pressure and became nonverbal.  In the emergency department he was found to be hypothermic.  CT scan of the head did not show any acute findings.  A left-sided pneumonia was noted on chest x-ray.  Patient was placed on antibiotics.  Central line was placed.  He was admitted to the intensive care unit.   Following admission, patient was treated with IV fluids and antibiotics.  Lactic acid level by 7/26 had normalized.  Procalcitonin trended downward.  Did not require pressor support.  By 7/28, white count normalized.  Patient was stabilized and able to be moved out of the ICU.  He has remained severely agitated at times getting quite aggressive with staff.  He required both restraints and medications initially.  Patient himself speaks with garbled speech, does not appear to follow commands.  Unclear if this is his baseline.  Seen by speech therapy and placed on dysphagia 1 diet with continued risk for aspiration.  Patient's brother and sister plan to meet to discuss goals of care later today.  Assessment/Plan: Active Problems:   Normocytic anemia: Hemoglobin remained stable, no evidence of bleeding.    Seizure disorder [facility]): Noted to be on Depakote, Dilantin and Keppra.  Been given IV for now.  No seizure activity noted.    HTN  (hypertension): Initially held off on his antihypertensives.  Resume once blood pressure starts to elevate.    Underweight: Patient meets criteria as evidenced with increased nutrient needs related to acute illness as evidenced by estimated needs, BMI of 16.64.  Seen by nutrition.  Seen by speech therapy on 7/27 and felt to be at high risk for silent aspiration although no immediate signs of aspiration.  Patient placed on dysphagia 1 diet with nectar thick liquid.    Severe sepsis with acute organ dysfunction (HCC) secondary to healthcare associated pneumonia, highly suspicious for aspiration: Patient met criteria for sepsis on admission with lactic acidosis, leukocytosis, elevated procalcitonin level and noted to be hypotensive and hypothermic with altered mental status.  On broad-spectrum antibiotics which we will continue until white blood cell count normalized.  Lactic acid level normalized, so sepsis felt to be resolved.  Have cut back on IV fluids now the blood pressure has remained stable.  Did not require pressor support.  Holding off on antihypertensives for now.  Follow-up procalcitonin level.  White count now normalized.  Seen by speech therapy, chronic history of dysphagia.  Placed on dysphagia 1 diet with nectar thick liquids    Cerebral palsy ([place]): Stable    Senile dementia with behavioral disturbance (HCC)/psychosis: Apparently, patient has confusion and agitation and combativeness at baseline.  CT scan of the head unremarkable for anything acute.:  Continue Exelon    Hypothermia: Resolved.  Felt to be secondary to sepsis.  Persisting  COVID positive: Positive COVID test here had apparently  been at the [place] unit at skilled nursing facility and was unclear how long ago he tested positive.  Continue steroids.  He is not hypoxic, so okay to hold off on Remdisivir for now.  Does not meet criteria for Actemra.  Code Status: DNR  Family Communication: Called brother's number in chart, left  message  Disposition Plan: Stable, discharged back to skilled nursing once white blood cell count normalized and infection felt to be stable.  However, siblings meeting today to discuss goals of care   Consultants:  None  Procedures:  None*   Antimicrobials:  IV vancomycin 7/25- 7/27  IV cefepime 7/25-present  DVT prophylaxis: Subcu heparin   Objective: Vitals:   09/17/18 1055 09/17/18 1142  BP: 138/74 (!) 93/58  Pulse: (!) 27   Resp:    Temp:    SpO2: (!) 82% 100%    Intake/Output Summary (Last 24 hours) at 09/17/2018 1204 Last data filed at 09/16/2018 2049 Gross per 24 hour  Intake 80 ml  Output 100 ml  Net -20 ml   Filed Weights   08/30/2018 1725 09/10/2018 2300 09/16/18 0334  Weight: 64.9 kg 42.1 kg 52.6 kg   Body mass index is 16.64 kg/m.  Exam:   General: Awake, does not follow commands  HEENT: Poor dentition, mucous membranes appear slightly dry,  Neck: Supple, no JVD  Cardiovascular: Regular rate and rhythm, S1-S2, borderline bradycardia  Respiratory: Clear to auscultation bilaterally, decreased at bases  Abdomen: Soft, nontender appearing, nondistended, hypoactive bowel sounds  Musculoskeletal: No clubbing or cyanosis or edema  Skin: No visible skin breaks, tears or lesions.  Old scabs on legs bilaterally.  Patient becomes agitated with attempt to move and look at his backside  Neuro: Chronic cerebral palsy  Psychiatry: Chronically with psychosis   Data Reviewed: CBC: Recent Labs  Lab 08/28/2018 1530 09/15/18 0450 09/16/18 0515 09/17/18 1102  WBC 26.5* 22.9* 16.1* 7.8  NEUTROABS 23.6* 21.9*  --   --   HGB 9.4* 9.4* 9.1* 9.6*  HCT 30.0* 30.1* 28.6* 30.6*  MCV 98.0 98.4 99.7 97.5  PLT 210 212 188 342   Basic Metabolic Panel: Recent Labs  Lab 09/18/2018 1530 09/15/18 0450 09/16/18 0515 09/17/18 0712  NA 141 143 145  --   K 5.0 4.3 4.5  --   CL 104 108 108  --   CO2 '26 25 28  ' --   GLUCOSE 108* 93 73  --   BUN '23 19 20  ' --    CREATININE 0.93 0.70 0.61 0.58*  CALCIUM 9.3 8.3* 8.6*  --    GFR: Estimated Creatinine Clearance: 57.5 mL/min (A) (by C-G formula based on SCr of 0.58 mg/dL (L)). Liver Function Tests: Recent Labs  Lab 09/12/2018 1530 09/15/18 0450 09/16/18 0515  AST '25 19 19  ' ALT '18 16 16  ' ALKPHOS 117 113 133*  BILITOT 0.3 0.3 0.3  PROT 6.5 5.5* 5.2*  ALBUMIN 3.1* 2.4* 2.3*   No results for input(s): LIPASE, AMYLASE in the last 168 hours. Recent Labs  Lab 08/25/2018 1530  AMMONIA 49*   Coagulation Profile: Recent Labs  Lab 08/21/2018 1530 09/15/18 0450  INR 1.1 1.2   Cardiac Enzymes: No results for input(s): CKTOTAL, CKMB, CKMBINDEX, TROPONINI in the last 168 hours. BNP (last 3 results) No results for input(s): PROBNP in the last 8760 hours. HbA1C: Recent Labs    09/17/18 0717  HGBA1C 5.5   CBG: No results for input(s): GLUCAP in the last 168 hours. Lipid Profile: No  results for input(s): CHOL, HDL, LDLCALC, TRIG, CHOLHDL, LDLDIRECT in the last 72 hours. Thyroid Function Tests: Recent Labs    09/20/2018 1530  TSH 2.019   Anemia Panel: Recent Labs    09/16/18 0515  FERRITIN 188   Urine analysis:    Component Value Date/Time   COLORURINE YELLOW 09/12/2018 1530   APPEARANCEUR CLEAR 09/17/2018 1530   LABSPEC 1.018 08/30/2018 1530   PHURINE 6.0 08/22/2018 1530   GLUCOSEU NEGATIVE 08/28/2018 1530   HGBUR NEGATIVE 09/13/2018 1530   BILIRUBINUR NEGATIVE 09/13/2018 1530   KETONESUR NEGATIVE 09/20/2018 1530   PROTEINUR NEGATIVE 09/09/2018 1530   UROBILINOGEN 1.0 03/31/2013 1511   NITRITE NEGATIVE 08/29/2018 1530   LEUKOCYTESUR NEGATIVE 09/08/2018 1530   Sepsis Labs: '@LABRCNTIP' (procalcitonin:4,lacticidven:4)  ) Recent Results (from the past 240 hour(s))  Blood Culture (routine x 2)     Status: None (Preliminary result)   Collection Time: 08/31/2018  3:30 PM   Specimen: BLOOD  Result Value Ref Range Status   Specimen Description   Final    BLOOD BLOOD LEFT HAND  Performed at [facility], [place] [address] [place], [place] [address]    Special Requests   Final    BOTTLES DRAWN AEROBIC AND ANAEROBIC Blood Culture adequate volume Performed at [place] [address], [place], [place] [address]    Culture   Final    NO GROWTH 3 DAYS Performed at [facility], [place] [address] [place], [place] [address]    Report Status PENDING  Incomplete  Blood Culture (routine x 2)     Status: None (Preliminary result)   Collection Time: 08/23/2018  3:30 PM   Specimen: BLOOD  Result Value Ref Range Status   Specimen Description   Final    BLOOD BLOOD LEFT FOREARM Performed at [place] [address] [place], [place] [address]    Special Requests   Final    BOTTLES DRAWN AEROBIC AND ANAEROBIC Blood Culture adequate volume Performed at [place] [address], [place], [place] [address]    Culture   Final    NO GROWTH 3 DAYS Performed at [facility], [place] [address], [place], [place] [address]    Report Status PENDING  Incomplete  Urine culture     Status: None   Collection Time: 09/01/2018  3:30 PM   Specimen: In/Out Cath Urine  Result Value Ref Range Status   Specimen Description   Final    IN/OUT CATH URINE Performed at [place] [address], [place], [place] [address]    Special Requests   Final    Normal Performed at [facility], [place] [address], [place], [place] [address]    Culture   Final    NO GROWTH Performed at [facility], [place] [address] [place], [place] [address]    Report Status 09/15/2018 FINAL  Final  SARS Coronavirus 2 (CEPHEID- Performed in [place] hospital lab), Hosp Order     Status: Abnormal   Collection Time: 08/28/2018  4:40 PM   Specimen: Nasopharyngeal Swab  Result Value Ref Range Status   SARS Coronavirus 2 POSITIVE (A) NEGATIVE Final    Comment: CRITICAL RESULT CALLED  TO, READ BACK BY AND VERIFIED WITH: Donley Redder [id number] @ 2119 BY J SCOTTON (NOTE) If result is NEGATIVE SARS-CoV-2 target nucleic acids are NOT DETECTED. The SARS-CoV-2 RNA is generally detectable in upper and lower  respiratory  specimens during the acute phase of infection. The lowest  concentration of SARS-CoV-2 viral copies this assay can detect is 250  copies / mL. A negative result does not preclude SARS-CoV-2 infection  and should not be used as the sole basis for treatment or other  patient management decisions.  A negative result may occur with  improper specimen collection / handling, submission of specimen other  than nasopharyngeal swab, presence of viral mutation(s) within the  areas targeted by this assay, and inadequate number of viral copies  (<250 copies / mL). A negative result must be combined with clinical  observations, patient history, and epidemiological information. If result is POSITIVE SARS-CoV-2 target nucleic acids  are DETECTED. The SARS-CoV-2 RNA is generally detectable in upper and lower  respiratory specimens during the acute phase of infection.  Positive  results are indicative of active infection with SARS-CoV-2.  Clinical  correlation with patient history and other diagnostic information is  necessary to determine patient infection status.  Positive results do  not rule out bacterial infection or co-infection with other viruses. If result is PRESUMPTIVE POSTIVE SARS-CoV-2 nucleic acids MAY BE PRESENT.   A presumptive positive result was obtained on the submitted specimen  and confirmed on repeat testing.  While 2019 novel coronavirus  (SARS-CoV-2) nucleic acids may be present in the submitted sample  additional confirmatory testing may be necessary for epidemiological  and / or clinical management purposes  to differentiate between  SARS-CoV-2 and other Sarbecovirus currently known to infect humans.  If clinically indicated additional testing  with an alternate test  methodol ogy [phone]) is advised. The SARS-CoV-2 RNA is generally  detectable in upper and lower respiratory specimens during the acute  phase of infection. The expected result is Negative. Fact Sheet for Patients:  StrictlyIdeas.no Fact Sheet for Healthcare Providers: BankingDealers.co.za This test is not yet approved or cleared by the [place] FDA and has been authorized for detection and/or diagnosis of SARS-CoV-2 by FDA under an Emergency Use Authorization (EUA).  This EUA will remain in effect (meaning this test can be used) for the duration of the COVID-19 declaration under Section 564(b)(1) of the Act, 21 U.S.C. section 360bbb-3(b)(1), unless the authorization is terminated or revoked sooner. Performed at [facility], [place] [address] [place], [place] [address]   MRSA PCR Screening     Status: None   Collection Time: 09/03/2018  6:18 PM   Specimen: Nasal Mucosa; Nasopharyngeal  Result Value Ref Range Status   MRSA by PCR NEGATIVE NEGATIVE Final    Comment:        The GeneXpert MRSA Assay (FDA approved for NASAL specimens only), is one component of a comprehensive MRSA colonization surveillance program. It is not intended to diagnose MRSA infection nor to guide or monitor treatment for MRSA infections. Performed at [facility], [place] [address], [place], [place] [address]   Respiratory Panel by PCR     Status: None   Collection Time: 09/18/2018  6:35 PM   Specimen: Nasal Mucosa; Respiratory  Result Value Ref Range Status   Adenovirus NOT DETECTED NOT DETECTED Final   Coronavirus 229E NOT DETECTED NOT DETECTED Final    Comment: (NOTE) The Coronavirus on the Respiratory Panel, DOES NOT test for the novel  Coronavirus (2019 nCoV)    Coronavirus HKU1 NOT DETECTED NOT DETECTED Final   Coronavirus NL63 NOT DETECTED NOT DETECTED Final   Coronavirus OC43 NOT  DETECTED NOT DETECTED Final   Metapneumovirus NOT DETECTED  NOT DETECTED Final   Rhinovirus / Enterovirus NOT DETECTED NOT DETECTED Final   Influenza A NOT DETECTED NOT DETECTED Final   Influenza B NOT DETECTED NOT DETECTED Final   Parainfluenza Virus 1 NOT DETECTED NOT DETECTED Final   Parainfluenza Virus 2 NOT DETECTED NOT DETECTED Final   Parainfluenza Virus 3 NOT DETECTED NOT DETECTED Final   Parainfluenza Virus 4 NOT DETECTED NOT DETECTED Final   Respiratory Syncytial Virus NOT DETECTED NOT DETECTED Final   Bordetella pertussis NOT DETECTED NOT DETECTED Final   Chlamydophila pneumoniae NOT DETECTED NOT DETECTED Final   Mycoplasma pneumoniae NOT DETECTED NOT DETECTED Final    Comment: Performed at [facility], [place] [address] [place], [place] [address]      Studies: No results found.  Scheduled Meds: . chlorhexidine  15 mL Mouth Rinse BID  . Chlorhexidine Gluconate Cloth  6 each Topical Q0600  . cholecalciferol  2,000 Units Oral Daily  . enoxaparin (LOVENOX) injection  40 mg Subcutaneous Q24H  . mouth rinse  15 mL Mouth Rinse BID  . methylPREDNISolone (SOLU-MEDROL) injection  60 mg Intravenous Daily  . rivastigmine  4.6 mg Transdermal Daily  . sodium chloride flush  10-40 mL Intracatheter Q12H    Continuous Infusions: . azithromycin 500 mg (09/16/18 1643)  . ceFEPime (MAXIPIME) IV 2 g (09/17/18 0804)  . lactated ringers 50 mL/hr at 09/15/18 1119  . levETIRAcetam 500 mg (09/17/18 1058)  . phenytoin (DILANTIN) IV 200 mg (09/17/18 0435)  . valproate sodium 750 mg (09/17/18 0858)     LOS: 3 days     Annita Brod, MD [facility]  To reach me or the doctor on call, go to: [email or web address] Password [facility]  09/17/2018, 12:04 PM

## 2018-09-17 NOTE — Progress Notes (Addendum)
Pharmacy Antibiotic Note  Wesley Sullivan is a 78 y.o. male admitted on 09/08/2018 with pneumonia.    He has been on azith/cefepime for his PNA for 3d now. All cultures remained neg. His MRSA PCR came back neg so vanc has been dced. Will discuss with MD to add a 5d stop date to his cefepime and azith. His renal fxn remains stable. He has been hypothermic for the past 2 days.   Plan:  Cont Cefepime 2g IV q12 F/u with renal function and stop date   Height: 5\' 10"  (177.8 cm) Weight: 115 lb 15.4 oz (52.6 kg) IBW/kg (Calculated) : 73  Temp (24hrs), Avg:90.4 F (32.4 C), Min:90.4 F (32.4 C), Max:90.4 F (32.4 C)  Recent Labs  Lab 08/28/2018 1530 09/15/18 0450 09/15/18 0625 09/16/18 0515  WBC 26.5* 22.9*  --  16.1*  CREATININE 0.93 0.70  --  0.61  LATICACIDVEN 1.3  2.0*  --  1.3  --     Estimated Creatinine Clearance: 57.5 mL/min (by C-G formula based on SCr of 0.61 mg/dL).    No Known Allergies  Cultures:  7/25 blood>>ngtd 7/25 urine>>neg 7/25 urinary legionella>>pend 7/25 urinary strep>>neg 7/25 MRSA PCR>>neg  7/25 azith>> 7/25 cefepime>> 7/25 vanc>>7/27  Onnie Boer, PharmD, [place], AAHIVP, CPP Infectious Disease Pharmacist 09/17/2018 8:34 AM  Addendum  Madaline Brilliant to add 5d stop date to abx per Dr. [place] Pink. Rx will sign off.   Onnie Boer, PharmD, BCIDP, AAHIVP, CPP Infectious Disease Pharmacist 09/17/2018 8:49 AM

## 2018-09-17 NOTE — Care Management Important Message (Signed)
Important Message  Patient Details  Name: Wesley Sullivan MRN: [id number] Date of Birth: [birth year]   Medicare Important Message Given:  Yes - Important Message mailed due to current National Emergency    Verbal consent obtained due to current National Emergency  Relationship to patient: Family Member Contact Name: Afnan Emberton Call Date: 09/17/18  Time: 1623 Phone: [phone] Outcome: Spoke with contact Important Message [facility] [place] [address])     Orbie Pyo 09/17/2018, 4:24 PM

## 2018-09-18 DIAGNOSIS — G804 Ataxic cerebral palsy: Secondary | ICD-10-CM

## 2018-09-18 DIAGNOSIS — J9601 Acute respiratory failure with hypoxia: Secondary | ICD-10-CM

## 2018-09-18 DIAGNOSIS — D649 Anemia, unspecified: Secondary | ICD-10-CM

## 2018-09-18 LAB — BASIC METABOLIC PANEL
Anion gap: 5 (ref 5–15)
BUN: 18 mg/dL (ref 8–23)
CO2: 27 mmol/L (ref 22–32)
Calcium: 8.2 mg/dL — ABNORMAL LOW (ref 8.9–10.3)
Chloride: 109 mmol/L (ref 98–111)
Creatinine, Ser: 0.54 mg/dL — ABNORMAL LOW (ref 0.61–1.24)
GFR calc Af Amer: >60 mL/min (ref >60)
GFR calc non Af Amer: >60 mL/min (ref >60)
Glucose, Bld: 102 mg/dL — ABNORMAL HIGH (ref 70–99)
Potassium: 4.1 mmol/L (ref 3.5–5.1)
Sodium: 141 mmol/L (ref 135–145)

## 2018-09-18 LAB — LEGIONELLA PNEUMOPHILA SEROGP 1 UR AG: L. pneumophila Serogp 1 Ur Ag: NEGATIVE

## 2018-09-18 LAB — PROCALCITONIN: Procalcitonin: 1.57 ng/mL

## 2018-09-18 MED ORDER — SODIUM CHLORIDE 0.9 % IV SOLN
1.5000 g | Freq: Four times a day (QID) | INTRAVENOUS | Status: DC
  Administered 2018-09-18 – 2018-09-20 (×8): 1.5 g via INTRAVENOUS
  Filled 2018-09-18: qty 1.5
  Filled 2018-09-18: qty 4
  Filled 2018-09-18 (×5): qty 1.5
  Filled 2018-09-18: qty 4
  Filled 2018-09-18: qty 1.5

## 2018-09-18 NOTE — Progress Notes (Signed)
Called and spoke with patient's brother, Izell [place].  He was updated on patient's condition and all questions answered.  Earleen Reaper RN

## 2018-09-18 NOTE — Plan of Care (Signed)

## 2018-09-18 NOTE — Progress Notes (Signed)
Spoke with brother Reyne Dumas at this time and given updates.  He asked about them visiting and informed that they only allow visitors when patient is actively dying and then it was just one visitor for 15 min.  Informed we could also facetime if they would like.  Stated he was ok not visiting because that meant he was doing ok.  Stated he would just continue to call to get updates.

## 2018-09-18 NOTE — Progress Notes (Addendum)
[facility] PROGRESS NOTE    Progress Note  Wesley Sullivan  OEH:[id number] DOB: [birth year] DOA: 09/10/2018 PCP: Lucianne Lei, MD     Brief Narrative:   Wesley Sullivan is an 78 y.o. male past medical history significant for diabetes mellitus type 2, dementia cerebral palsy cognitive impairment seizure, speech impairment hypertension he had been complaining of headache at the skilled nursing facility and skilled nursing facility he was in the [place] unit after that apparently his blood pressure dropped and he became more verbal.  In the emergency department he was found to be hypothermic with a CT scan of the head did not show acute findings.  Chest x-ray showed a new left lower lobe infiltrate, he was started empirically the antibiotics central line was placed transferred to the ICU.  He did not require pressors on on 09/17/2018 he stabilized and moved out of the ICU.  He has remained severely agitated and quite aggressive with staff requiring medications and restraint.  Patient speaks in a garbled speech does not appear to follow command.  Evaluated by speech recommended a dysphagia 1 diet. Assessment/Plan:   Acute respiratory failure with hypoxia due to SARS-CoV-2 versus healthcare associated pneumonia: High suspicion for aspiration he met sepsis criteria. He was started empirically on IV antibiotics and was fluid resuscitated.  He did not require pressors. Speech evaluated the patient and recommended a dysphagia 1 diet. Apparently he tested SARS-CoV-2 positive at skilled nursing facility unclear how long ago test came back positive.  He was continued on steroid, he did not get IV Remdesivir or Actemra. He is not hypoxic discontinue IV steroids.  Possible sepsis with acute organ dysfunction: Secondary to possible aspiration pneumonia, speech evaluated the patient found him to be high risk for aspirating. Patient met criteria for sepsis he was fluid resuscitated and started  empirically on antibiotics.  His sepsis did resolve and he did not require pressors.  His antihypertensive medication has been hold. Early on a pair hugger as is hard to keep his temperature up.  Seizure disorder: Continue Depakote Dilantin and Keppra.  No seizure activity noted.  Normocytic anemia: Hemoglobin seems to be stable no signs of overt bleeding.  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 Continue Exelon.  Speech impairment Cerebral palsy ([place])   Essential hypertension: Currently on no antihypertensive medication.  Pressure injury of skin RN Pressure Injury Documentation: Pressure Injury 09/03/2018 Ankle Left Unstageable - Full thickness tissue loss in which the base of the ulcer is covered by slough (yellow, tan, gray, green or brown) and/or eschar (tan, brown or black) in the wound bed. previously healed area? (Active)  09/02/2018 2300  Location: Ankle  Location Orientation: Left  Staging: Unstageable - Full thickness tissue loss in which the base of the ulcer is covered by slough (yellow, tan, gray, green or brown) and/or eschar (tan, brown or black) in the wound bed.  Wound Description (Comments): previously healed area?  Present on Admission: Yes     Pressure Injury 09/19/2018 Foot Left Unstageable - Full thickness tissue loss in which the base of the ulcer is covered by slough (yellow, tan, gray, green or brown) and/or eschar (tan, brown or black) in the wound bed. previously healed? (Active)  09/13/2018 2300  Location: Foot  Location Orientation: Left  Staging: Unstageable - Full thickness tissue loss in which the base of the ulcer is covered by slough (yellow, tan, gray, green or brown) and/or  eschar (tan, brown or black) in the wound bed.  Wound Description (Comments): previously healed?  Present on Admission: Yes    Estimated body mass index is 17.37 kg/m as calculated from the following:    Height as of this encounter: '5\' 10"'  (1.778 m).   Weight as of this encounter: 54.9 kg. Malnutrition Type:  Nutrition Problem: Increased nutrient needs Etiology: acute illness   Malnutrition Characteristics:  Signs/Symptoms: estimated needs   Nutrition Interventions:  Interventions: Ensure Enlive (each supplement provides 350kcal and 20 grams of protein), Hormel Shake, Magic cup, MVI    DVT prophylaxis: lovenox Family Communication:none Disposition Plan/Barrier to D/C: SNF in 24 hrs Code Status:     Code Status Orders  (From admission, onward)         Start     Ordered   09/18/2018 1807  Do not attempt resuscitation (DNR)  Continuous    Question Answer Comment  In the event of cardiac or respiratory ARREST Do not call a "code blue"   In the event of cardiac or respiratory ARREST Do not perform Intubation, CPR, defibrillation or ACLS   In the event of cardiac or respiratory ARREST Use medication by any route, position, wound care, and other measures to relive pain and suffering. May use oxygen, suction and manual treatment of airway obstruction as needed for comfort.      09/06/2018 1809        Code Status History    Date Active Date Inactive Code Status Order ID Comments User Context   08/29/2018 1809 08/24/2018 1809 DNR [id number]  Karie Kirks, DO ED   03/31/2013 2059 04/11/2013 1911 DNR [id number]  Gardiner Barefoot, NP Inpatient   03/31/2013 1941 03/31/2013 2059 Full Code [id number]  Charlynne Cousins, MD Inpatient   02/14/2011 1727 03/23/2011 2106 Full Code [id number]  Babette Relic, MD ED   Advance Care Planning Activity    Advance Directive Documentation     Most Recent Value  Type of Advance Directive  Out of facility DNR (pink MOST or yellow form)  Pre-existing out of facility DNR order (yellow form or pink MOST form)  Yellow form placed in chart (order not valid for inpatient use)  "MOST" Form in Place?  -        IV Access:    Peripheral IV   Procedures  and diagnostic studies:   No results found.   Medical Consultants:    None.  Anti-Infectives:   Cefepime and azithromycin  Subjective:    Wesley Sullivan sleeping comfortable in bed  Objective:    Vitals:   09/17/18 1615 09/17/18 1643 09/17/18 2048 09/18/18 0500  BP: 103/65  101/86   Pulse: (!) 36  (!) 41   Resp:   10   Temp:  (!) 90.2 F (32.3 C)    TempSrc:  Axillary    SpO2: 100%  100%   Weight:    54.9 kg  Height:       SpO2: 100 %   Intake/Output Summary (Last 24 hours) at 09/18/2018 0915 Last data filed at 09/17/2018 2046 Gross per 24 hour  Intake 0 ml  Output 0 ml  Net 0 ml   Filed Weights   08/28/2018 2300 09/16/18 0334 09/18/18 0500  Weight: 42.1 kg 52.6 kg 54.9 kg    Exam: General exam: In no acute distress. Respiratory system: Good air movement and diffuse crackles bilaterally Cardiovascular system: S1 & S2 heard, RRR. No JVD. Gastrointestinal system: Abdomen is nondistended, soft  and nontender.  Central nervous system: Alert and oriented. No focal neurological deficits. Extremities: No pedal edema. Skin: No rashes, lesions or ulcers Psychiatry: Judgement and insight appear normal. Mood & affect appropriate.    Data Reviewed:    Labs: Basic Metabolic Panel: Recent Labs  Lab 09/13/2018 1530 09/15/18 0450 09/16/18 0515 09/17/18 0712 09/18/18 0538  NA 141 143 145  --  141  K 5.0 4.3 4.5  --  4.1  CL 104 108 108  --  109  CO2 '26 25 28  ' --  27  GLUCOSE 108* 93 73  --  102*  BUN '23 19 20  ' --  18  CREATININE 0.93 0.70 0.61 0.58* 0.54*  CALCIUM 9.3 8.3* 8.6*  --  8.2*   GFR Estimated Creatinine Clearance: 60 mL/min (A) (by C-G formula based on SCr of 0.54 mg/dL (L)). Liver Function Tests: Recent Labs  Lab 09/10/2018 1530 09/15/18 0450 09/16/18 0515  AST '25 19 19  ' ALT '18 16 16  ' ALKPHOS 117 113 133*  BILITOT 0.3 0.3 0.3  PROT 6.5 5.5* 5.2*  ALBUMIN 3.1* 2.4* 2.3*   No results for input(s): LIPASE, AMYLASE in the last 168  hours. Recent Labs  Lab 08/30/2018 1530  AMMONIA 49*   Coagulation profile Recent Labs  Lab 09/08/2018 1530 09/15/18 0450  INR 1.1 1.2   COVID-19 Labs  Recent Labs    09/16/18 0515  DDIMER 0.59*  FERRITIN 188  CRP 5.3*    Lab Results  Component Value Date   SARSCOV2NAA POSITIVE (A) 09/03/2018    CBC: Recent Labs  Lab 09/01/2018 1530 09/15/18 0450 09/16/18 0515 09/17/18 1102  WBC 26.5* 22.9* 16.1* 7.8  NEUTROABS 23.6* 21.9*  --   --   HGB 9.4* 9.4* 9.1* 9.6*  HCT 30.0* 30.1* 28.6* 30.6*  MCV 98.0 98.4 99.7 97.5  PLT 210 212 188 216   Cardiac Enzymes: No results for input(s): CKTOTAL, CKMB, CKMBINDEX, TROPONINI in the last 168 hours. BNP (last 3 results) No results for input(s): PROBNP in the last 8760 hours. CBG: No results for input(s): GLUCAP in the last 168 hours. D-Dimer: Recent Labs    09/16/18 0515  DDIMER 0.59*   Hgb A1c: Recent Labs    09/17/18 0717  HGBA1C 5.5   Lipid Profile: No results for input(s): CHOL, HDL, LDLCALC, TRIG, CHOLHDL, LDLDIRECT in the last 72 hours. Thyroid function studies: No results for input(s): TSH, T4TOTAL, T3FREE, THYROIDAB in the last 72 hours.  Invalid input(s): FREET3 Anemia work up: Recent Labs    09/16/18 0515  FERRITIN 188   Sepsis Labs: Recent Labs  Lab 08/29/2018 1530 09/15/18 0450 09/15/18 0625 09/16/18 0515 09/17/18 1102  PROCALCITON 6.76 4.18  --   --   --   WBC 26.5* 22.9*  --  16.1* 7.8  LATICACIDVEN 1.3  2.0*  --  1.3  --   --    Microbiology Recent Results (from the past 240 hour(s))  Blood Culture (routine x 2)     Status: None (Preliminary result)   Collection Time: 09/18/2018  3:30 PM   Specimen: BLOOD  Result Value Ref Range Status   Specimen Description   Final    BLOOD BLOOD LEFT HAND Performed at [facility], [place] [address] [place], [place] [address]    Special Requests   Final    BOTTLES DRAWN AEROBIC AND ANAEROBIC Blood Culture adequate volume Performed  at [place] [address], [place], [place] [address]  Culture   Final    NO GROWTH 4 DAYS Performed at [facility], [place] [address] [place], [place] [address]    Report Status PENDING  Incomplete  Blood Culture (routine x 2)     Status: None (Preliminary result)   Collection Time: 09/13/2018  3:30 PM   Specimen: BLOOD  Result Value Ref Range Status   Specimen Description   Final    BLOOD BLOOD LEFT FOREARM Performed at [place] [address] [place], [place] [address]    Special Requests   Final    BOTTLES DRAWN AEROBIC AND ANAEROBIC Blood Culture adequate volume Performed at [place] [address], [place], [place] [address]    Culture   Final    NO GROWTH 4 DAYS Performed at [facility], [place] [address], [place], [place] [address]    Report Status PENDING  Incomplete  Urine culture     Status: None   Collection Time: 08/28/2018  3:30 PM   Specimen: In/Out Cath Urine  Result Value Ref Range Status   Specimen Description   Final    IN/OUT CATH URINE Performed at [facility] [address] [place], [place] [address]    Special Requests   Final    Normal Performed at [facility], [place] [address] [place], [place] [address]    Culture   Final    NO GROWTH Performed at [facility], [place] [address], [place], [place] [address]    Report Status 09/15/2018 FINAL  Final  SARS Coronavirus 2 (CEPHEID- Performed in [place] hospital lab), Hosp Order     Status: Abnormal   Collection Time: 09/07/2018  4:40 PM   Specimen: Nasopharyngeal Swab  Result Value Ref Range Status   SARS Coronavirus 2 POSITIVE (A) NEGATIVE Final    Comment: CRITICAL RESULT CALLED TO, READ BACK BY AND VERIFIED WITH: Donley Redder [id number] @ 2979 BY J SCOTTON (NOTE) If result is NEGATIVE SARS-CoV-2 target nucleic acids are NOT DETECTED. The SARS-CoV-2 RNA is generally  detectable in upper and lower  respiratory specimens during the acute phase of infection. The lowest  concentration of SARS-CoV-2 viral copies this assay can detect is 250  copies / mL. A negative result does not preclude SARS-CoV-2 infection  and should not be used as the sole basis for treatment or other  patient management decisions.  A negative result may occur with  improper specimen collection / handling, submission of specimen other  than nasopharyngeal swab, presence of viral mutation(s) within the  areas targeted by this assay, and inadequate number of viral copies  (<250 copies / mL). A negative result must be combined with clinical  observations, patient history, and epidemiological information. If result is POSITIVE SARS-CoV-2 target nucleic acids  are DETECTED. The SARS-CoV-2 RNA is generally detectable in upper and lower  respiratory specimens during the acute phase of infection.  Positive  results are indicative of active infection with SARS-CoV-2.  Clinical  correlation with patient history and other diagnostic information is  necessary to determine patient infection status.  Positive results do  not rule out bacterial infection or co-infection with other viruses. If result is PRESUMPTIVE POSTIVE SARS-CoV-2 nucleic acids MAY BE PRESENT.   A presumptive positive result was obtained on the submitted specimen  and confirmed on repeat testing.  While 2019 novel coronavirus  (SARS-CoV-2) nucleic acids may be present in the submitted sample  additional confirmatory  testing may be necessary for epidemiological  and / or clinical management purposes  to differentiate between  SARS-CoV-2 and other Sarbecovirus currently known to infect humans.  If clinically indicated additional testing with an alternate test  methodol ogy [phone]) is advised. The SARS-CoV-2 RNA is generally  detectable in upper and lower respiratory specimens during the acute  phase of infection. The  expected result is Negative. Fact Sheet for Patients:  StrictlyIdeas.no Fact Sheet for Healthcare Providers: BankingDealers.co.za This test is not yet approved or cleared by the [place] FDA and has been authorized for detection and/or diagnosis of SARS-CoV-2 by FDA under an Emergency Use Authorization (EUA).  This EUA will remain in effect (meaning this test can be used) for the duration of the COVID-19 declaration under Section 564(b)(1) of the Act, 21 U.S.C. section 360bbb-3(b)(1), unless the authorization is terminated or revoked sooner. Performed at [facility], [place] [address], [place], [place] [address]   MRSA PCR Screening     Status: None   Collection Time: 09/18/2018  6:18 PM   Specimen: Nasal Mucosa; Nasopharyngeal  Result Value Ref Range Status   MRSA by PCR NEGATIVE NEGATIVE Final    Comment:        The GeneXpert MRSA Assay (FDA approved for NASAL specimens only), is one component of a comprehensive MRSA colonization surveillance program. It is not intended to diagnose MRSA infection nor to guide or monitor treatment for MRSA infections. Performed at [facility], [place] [address], [place], [place] [address]   Respiratory Panel by PCR     Status: None   Collection Time: 09/12/2018  6:35 PM   Specimen: Nasal Mucosa; Respiratory  Result Value Ref Range Status   Adenovirus NOT DETECTED NOT DETECTED Final   Coronavirus 229E NOT DETECTED NOT DETECTED Final    Comment: (NOTE) The Coronavirus on the Respiratory Panel, DOES NOT test for the novel  Coronavirus (2019 nCoV)    Coronavirus HKU1 NOT DETECTED NOT DETECTED Final   Coronavirus NL63 NOT DETECTED NOT DETECTED Final   Coronavirus OC43 NOT DETECTED NOT DETECTED Final   Metapneumovirus NOT DETECTED NOT DETECTED Final   Rhinovirus / Enterovirus NOT DETECTED NOT DETECTED Final   Influenza A NOT DETECTED NOT DETECTED Final    Influenza B NOT DETECTED NOT DETECTED Final   Parainfluenza Virus 1 NOT DETECTED NOT DETECTED Final   Parainfluenza Virus 2 NOT DETECTED NOT DETECTED Final   Parainfluenza Virus 3 NOT DETECTED NOT DETECTED Final   Parainfluenza Virus 4 NOT DETECTED NOT DETECTED Final   Respiratory Syncytial Virus NOT DETECTED NOT DETECTED Final   Bordetella pertussis NOT DETECTED NOT DETECTED Final   Chlamydophila pneumoniae NOT DETECTED NOT DETECTED Final   Mycoplasma pneumoniae NOT DETECTED NOT DETECTED Final    Comment: Performed at [facility], [place] [address] [place],  [address]     Medications:   . chlorhexidine  15 mL Mouth Rinse BID  . Chlorhexidine Gluconate Cloth  6 each Topical Q0600  . cholecalciferol  2,000 Units Oral Daily  . enoxaparin (LOVENOX) injection  40 mg Subcutaneous Q24H  . mouth rinse  15 mL Mouth Rinse BID  . methylPREDNISolone (SOLU-MEDROL) injection  60 mg Intravenous Daily  . rivastigmine  4.6 mg Transdermal Daily  . sodium chloride flush  10-40 mL Intracatheter Q12H   Continuous Infusions: . azithromycin 500 mg (09/17/18 1643)  . ceFEPime (MAXIPIME) IV 2 g (09/18/18 0807)  . lactated ringers 50 mL/hr at 09/15/18  1119  . levETIRAcetam 500 mg (09/18/18 0901)  . phenytoin (DILANTIN) IV 200 mg (09/18/18 0404)  . valproate sodium 750 mg (09/17/18 2349)    LOS: 4 days   Charlynne Cousins  [facility]  09/18/2018, 9:15 AM

## 2018-09-19 LAB — CULTURE, BLOOD (ROUTINE X 2)
Culture: NO GROWTH
Culture: NO GROWTH
Special Requests: ADEQUATE
Special Requests: ADEQUATE

## 2018-09-19 MED ORDER — ENOXAPARIN SODIUM 30 MG/0.3ML ~~LOC~~ SOLN
30.0000 mg | SUBCUTANEOUS | Status: DC
  Administered 2018-09-19: 30 mg via SUBCUTANEOUS
  Filled 2018-09-19: qty 0.3

## 2018-09-19 NOTE — NC FL2 (Deleted)
[place] LEVEL OF CARE SCREENING TOOL     IDENTIFICATION  Patient Name: Wesley Sullivan Birthdate: 06/11/1940 Sex: male Admission Date (Current Location): 10-12-18  [facility] and [place] Number:  Herbalist and Address:  [facility], [place] [address], [place], [place] [address]      Provider Number: [id number]  Attending Physician Name and Address:  Charlynne Cousins, MD  Relative Name and Phone Number:  Brenden Rudman    [phone](IONG)   Cell [phone]    Current Level of Care: Hospital Recommended Level of Care: [place] Prior Approval Number:    Date Approved/Denied:   PASRR Number: [id number]  Discharge Plan: SNF    Current Diagnoses: Patient Active Problem List   Diagnosis Date Noted  . Acute respiratory failure with hypoxia ([place]) 09/18/2018  . Speech impairment 09/16/2018  . Cerebral palsy ([place])   . 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 Hypothermia   . COVID-19 virus infection   . Pressure injury of skin 09/15/2018  . Severe sepsis with acute organ dysfunction ([place]) 10/12/18  . Diabetes ([place]) 11/25/2013  . Unspecified constipation 06/30/2013  . Sepsis ([place]) 03/31/2013  . Hypernatremia 03/31/2013  . Toxic encephalopathy 03/31/2013  . Underweight 03/31/2013  . Septic shock ([place]) 03/31/2013  . Altered mental status 03/20/2013  . Encounter for long-term (current) use of other medications 02/03/2013  . HTN (hypertension) 02/24/2011  . Psychosis ([place]) 02/16/2011  . DM 06/10/2007  . Normocytic anemia 06/10/2007  . THROMBOCYTOPENIA 06/10/2007  . ALCOHOL ABUSE 06/10/2007  . MENTAL RETARDATION 06/10/2007  . 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 Seizure disorder ([place]) 06/10/2007    Orientation RESPIRATION BLADDER Height & Weight     Self  Normal Incontinent Weight: 42.5 kg Height:  5\' 10"  (177.8 cm)  BEHAVIORAL SYMPTOMS/MOOD NEUROLOGICAL BOWEL NUTRITION STATUS   Dangerous to self, others or property, Other (Comment)(confused d/t enviorment change) (no neurolical issue) Incontinent Diet  AMBULATORY STATUS COMMUNICATION OF NEEDS Skin   Extensive Assist Verbally Normal                       Personal Care Assistance Level of Assistance  Total care, Dressing, Feeding, Bathing Bathing Assistance: Maximum assistance Feeding assistance: Maximum assistance Dressing Assistance: Maximum assistance Total Care Assistance: Maximum assistance   Functional Limitations Info  Speech, Hearing, Sight Sight Info: Adequate Hearing Info: Adequate Speech Info: Impaired(can speak but hasnt, per nurse)    SPECIAL CARE FACTORS FREQUENCY  PT (By licensed PT), OT (By licensed OT)     PT Frequency: 5/weekly OT Frequency: 5/weekly            Contractures Contractures Info: Not present    Additional Factors Info  Code Status Code Status Info: DNR             Current Medications (09/19/2018):  This is the current hospital active medication list Current Facility-Administered Medications  Medication Dose Route Frequency Provider Last Rate Last Dose  . ampicillin-sulbactam (UNASYN) 1.5 g in sodium chloride 0.9 % 100 mL IVPB  1.5 g Intravenous Q6H Charlynne Cousins, MD 200 mL/hr at 09/19/18 0823 1.5 g at 09/19/18 0823  . chlorhexidine (PERIDEX) 0.12 % solution 15 mL  15 mL Mouth Rinse BID Bonnielee Haff, MD   15 mL at 09/19/18 6644  . Chlorhexidine Gluconate Cloth 2 % PADS 6 each  6 each Topical Q0600 Elmer Sow, MD   6 each at  09/19/18 0539  . cholecalciferol (VITAMIN D) tablet 2,000 Units  2,000 Units Oral Daily Swayze, Ava, DO      . enoxaparin (LOVENOX) injection 30 mg  30 mg Subcutaneous Q24H Feliz Ortiz, Abraham, MD      . haloperidol lactate (HALDOL) injection 2 mg  2 mg Intravenous Q6H PRN Krishnan, Gokul, MD   2 mg at 09/16/18 1337  . levETIRAcetam (KEPPRA) IVPB 500 mg/100 mL premix  500 mg Intravenous Q12H Swayze, Ava, DO   Stopped at  09/19/18 0814  . LORazepam (ATIVAN) injection 2 mg  2 mg Intravenous Q4H PRN Krishnan, Sendil K, MD   2 mg at 09/19/18 1035  . MEDLINE mouth rinse  15 mL Mouth Rinse BID Mohan, Kinila T, MD   15 mL at 09/18/18 2218  . ondansetron (ZOFRAN) tablet 4 mg  4 mg Oral Q6H PRN Swayze, Ava, DO       Or  . ondansetron (ZOFRAN) injection 4 mg  4 mg Intravenous Q6H PRN Swayze, Ava, DO      . phenytoin (DILANTIN) 200 mg in sodium chloride 0.9 % 100 mL IVPB  200 mg Intravenous Q8H Swayze, Ava, DO 208 mL/hr at 09/19/18 0539 200 mg at 09/19/18 0539  . Resource ThickenUp Clear   Oral PRN Krishnan, Sendil K, MD      . rivastigmine (EXELON) 4.6 mg/24hr 4.6 mg  4.6 mg Transdermal Daily Swayze, Ava, DO   4.6 mg at 09/19/18 0829  . senna-docusate (Senokot-S) tablet 1 tablet  1 tablet Oral QHS PRN Swayze, Ava, DO      . sodium chloride flush (NS) 0.9 % injection 10-40 mL  10-40 mL Intracatheter Q12H Krishnan, Gokul, MD   10 mL at 09/18/18 2218  . sodium chloride flush (NS) 0.9 % injection 10-40 mL  10-40 mL Intracatheter PRN Krishnan, Gokul, MD      . sodium phosphate (FLEET) 7-19 GM/118ML enema 1 enema  1 enema Rectal Once PRN Swayze, Ava, DO      . sorbitol 70 % solution 30 mL  30 mL Oral Daily PRN Swayze, Ava, DO      . valproate (DEPACON) 750 mg in dextrose 5 % 50 mL IVPB  750 mg Intravenous Q12H Swayze, Ava, DO 57.5 mL/hr at 09/19/18 0827 750 mg at 09/19/18 0827     Discharge Medications: Please see discharge summary for a list of discharge medications.  Relevant Imaging Results:  Relevant Lab Results:   Additional Information has had sitter for his safety  Pablo Mathurin Naomi, RN    

## 2018-09-19 NOTE — NC FL2 (Addendum)
[place] LEVEL OF CARE SCREENING TOOL     IDENTIFICATION  Patient Name: Wesley Sullivan Birthdate: 06/11/1940 Sex: male Admission Date (Current Location): 10-12-18  [facility] [place] Number:  Herbalist and Address:  [facility], [place] [address], [place], [place] [address]      Provider Number: [id number]  Attending Physician Name and Address:  Charlynne Cousins, MD  Relative Name and Phone Number:  Brenden Rudman    [phone](IONG)   Cell [phone]    Current Level of Care: Hospital Recommended Level of Care: [place] Prior Approval Number:    Date Approved/Denied:   PASRR Number: [id number]  Discharge Plan: SNF    Current Diagnoses: Patient Active Problem List   Diagnosis Date Noted  . Acute respiratory failure with hypoxia ([place]) 09/18/2018  . Speech impairment 09/16/2018  . Cerebral palsy ([place])   . 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 Hypothermia   . COVID-19 virus infection   . Pressure injury of skin 09/15/2018  . Severe sepsis with acute organ dysfunction ([place]) 10/12/18  . Diabetes ([place]) 11/25/2013  . Unspecified constipation 06/30/2013  . Sepsis ([place]) 03/31/2013  . Hypernatremia 03/31/2013  . Toxic encephalopathy 03/31/2013  . Underweight 03/31/2013  . Septic shock ([place]) 03/31/2013  . Altered mental status 03/20/2013  . Encounter for long-term (current) use of other medications 02/03/2013  . HTN (hypertension) 02/24/2011  . Psychosis ([place]) 02/16/2011  . DM 06/10/2007  . Normocytic anemia 06/10/2007  . THROMBOCYTOPENIA 06/10/2007  . ALCOHOL ABUSE 06/10/2007  . MENTAL RETARDATION 06/10/2007  . 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 Seizure disorder ([place]) 06/10/2007    Orientation RESPIRATION BLADDER Height & Weight     Self  Normal Incontinent Weight: 42.5 kg Height:  5\' 10"  (177.8 cm)  BEHAVIORAL SYMPTOMS/MOOD NEUROLOGICAL BOWEL NUTRITION STATUS   Dangerous to self, others or property, Other (Comment)(confused d/t enviorment change) (no neurolical issue) Incontinent Diet  AMBULATORY STATUS COMMUNICATION OF NEEDS Skin   Extensive Assist Verbally Normal                       Personal Care Assistance Level of Assistance  Total care, Dressing, Feeding, Bathing Bathing Assistance: Maximum assistance Feeding assistance: Maximum assistance Dressing Assistance: Maximum assistance Total Care Assistance: Maximum assistance   Functional Limitations Info  Speech, Hearing, Sight Sight Info: Adequate Hearing Info: Adequate Speech Info: Impaired(can speak but hasnt, per nurse)    SPECIAL CARE FACTORS FREQUENCY  PT (By licensed PT), OT (By licensed OT)     PT Frequency: 5/weekly OT Frequency: 5/weekly            Contractures Contractures Info: Not present    Additional Factors Info  Code Status Code Status Info: DNR             Current Medications (09/19/2018):  This is the current hospital active medication list Current Facility-Administered Medications  Medication Dose Route Frequency Provider Last Rate Last Dose  . ampicillin-sulbactam (UNASYN) 1.5 g in sodium chloride 0.9 % 100 mL IVPB  1.5 g Intravenous Q6H Charlynne Cousins, MD 200 mL/hr at 09/19/18 0823 1.5 g at 09/19/18 0823  . chlorhexidine (PERIDEX) 0.12 % solution 15 mL  15 mL Mouth Rinse BID Bonnielee Haff, MD   15 mL at 09/19/18 6644  . Chlorhexidine Gluconate Cloth 2 % PADS 6 each  6 each Topical Q0600 Elmer Sow, MD   6 each at  09/19/18 0539  . cholecalciferol (VITAMIN D) tablet 2,000 Units  2,000 Units Oral Daily Swayze, Ava, DO      . enoxaparin (LOVENOX) injection 30 mg  30 mg Subcutaneous Q24H Marinda ElkFeliz Ortiz, Abraham, MD      . haloperidol lactate (HALDOL) injection 2 mg  2 mg Intravenous Q6H PRN Osvaldo ShipperKrishnan, Gokul, MD   2 mg at 09/16/18 1337  . levETIRAcetam (KEPPRA) IVPB 500 mg/100 mL premix  500 mg Intravenous Q12H Swayze, Ava, DO   Stopped at  09/19/18 0814  . LORazepam (ATIVAN) injection 2 mg  2 mg Intravenous Q4H PRN Hollice EspyKrishnan, Sendil K, MD   2 mg at 09/19/18 1035  . MEDLINE mouth rinse  15 mL Mouth Rinse BID Josiah LoboMohan, Kinila T, MD   15 mL at 09/18/18 2218  . ondansetron (ZOFRAN) tablet 4 mg  4 mg Oral Q6H PRN Swayze, Ava, DO       Or  . ondansetron (ZOFRAN) injection 4 mg  4 mg Intravenous Q6H PRN Swayze, Ava, DO      . phenytoin (DILANTIN) 200 mg in sodium chloride 0.9 % 100 mL IVPB  200 mg Intravenous Q8H Swayze, Ava, DO 208 mL/hr at 09/19/18 0539 200 mg at 09/19/18 0539  . Resource ThickenUp Clear   Oral PRN Hollice EspyKrishnan, Sendil K, MD      . rivastigmine (EXELON) 4.6 mg/24hr 4.6 mg  4.6 mg Transdermal Daily Swayze, Ava, DO   4.6 mg at 09/19/18 0829  . senna-docusate (Senokot-S) tablet 1 tablet  1 tablet Oral QHS PRN Swayze, Ava, DO      . sodium chloride flush (NS) 0.9 % injection 10-40 mL  10-40 mL Intracatheter Q12H Osvaldo ShipperKrishnan, Gokul, MD   10 mL at 09/18/18 2218  . sodium chloride flush (NS) 0.9 % injection 10-40 mL  10-40 mL Intracatheter PRN Osvaldo ShipperKrishnan, Gokul, MD      . sodium phosphate (FLEET) 7-19 GM/118ML enema 1 enema  1 enema Rectal Once PRN Swayze, Ava, DO      . sorbitol 70 % solution 30 mL  30 mL Oral Daily PRN Swayze, Ava, DO      . valproate (DEPACON) 750 mg in dextrose 5 % 50 mL IVPB  750 mg Intravenous Q12H Swayze, Ava, DO 57.5 mL/hr at 09/19/18 0827 750 mg at 09/19/18 0827     Discharge Medications: Please see discharge summary for a list of discharge medications.  Relevant Imaging Results:  Relevant Lab Results:   Additional Information has had sitter for his safety  Durenda GuthrieBrady, Shakil Dirk Naomi, RN

## 2018-09-19 NOTE — Care Management (Signed)
Case manager spoke with Freda Munro at [facility] concerning patient returning although he has had sitters. They are agreeable to receiving him back. CM informed her that further conversation would occur concerning palliative or hospice care. CM will continue to follow for disposition.     Ricki Miller, RN BSN Case Manager [phone]

## 2018-09-19 NOTE — Progress Notes (Signed)
[facility] PROGRESS NOTE    Progress Note  Wesley Sullivan  QZR:[id number] DOB: 1940/05/04 DOA: 08/23/2018 PCP: Lucianne Lei, MD     Brief Narrative:   Wesley Sullivan is an 78 y.o. male past medical history significant for diabetes mellitus type 2, dementia cerebral palsy cognitive impairment seizure, speech impairment hypertension he had been complaining of headache at the skilled nursing facility and skilled nursing facility he was in the [facility] after that apparently his blood pressure dropped and he became more verbal.  In the emergency department he was found to be hypothermic with a CT scan of the head did not show acute findings.  Chest x-ray showed a new left lower lobe infiltrate, he was started empirically the antibiotics central line was placed transferred to the ICU.  He did not require pressors on on 09/17/2018 he stabilized and moved out of the ICU.  He has remained severely agitated and quite aggressive with staff requiring medications and restraint.  Patient speaks in a garbled speech does not appear to follow command.  Evaluated by speech recommended a dysphagia 1 diet. Assessment/Plan:   Acute respiratory failure with hypoxia due to SARS-CoV-2 versus healthcare associated pneumonia: High suspicion for aspiration he met sepsis criteria. He was started empirically on IV antibiotics and was fluid resuscitated.  He did not require pressors. Placed him n.p.o. as he is probably aspirating. Had a long discussion with the brother who is power of attorney and explained to him his poor prognoses and the fact that he probably has now aspiration pneumonia and hypothermia. He understands and from his part he would like to move towards comfort care, he would like to speak to his sisters and will have an answer for [place] in 24 hours to see if they would like to move towards comfort care.  Possible sepsis with acute organ dysfunction: Secondary to possible aspiration pneumonia,  speech evaluated the patient found him to be high risk for aspirating. Patient met criteria for sepsis he was fluid resuscitated and started empirically on antibiotics.  His sepsis did resolve and he did not require pressors.  Place him n.p.o. as he seems to be aspirating, he is hypothermic and unresponsive.  Seizure disorder: Continue Depakote Dilantin and Keppra.  No seizure activity noted.  Normocytic anemia: Hemoglobin seems to be stable no signs of overt bleeding.  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 Continue Exelon.  Speech impairment Cerebral palsy ([place])   Essential hypertension: Currently on no antihypertensive medication.  Pressure injury of skin RN Pressure Injury Documentation: Pressure Injury 08/21/2018 Ankle Left Unstageable - Full thickness tissue loss in which the base of the ulcer is covered by slough (yellow, tan, gray, green or brown) and/or eschar (tan, brown or black) in the wound bed. previously healed area? (Active)  09/03/2018 2300  Location: Ankle  Location Orientation: Left  Staging: Unstageable - Full thickness tissue loss in which the base of the ulcer is covered by slough (yellow, tan, gray, green or brown) and/or eschar (tan, brown or black) in the wound bed.  Wound Description (Comments): previously healed area?  Present on Admission: Yes     Pressure Injury 09/12/2018 Foot Left Unstageable - Full thickness tissue loss in which the base of the ulcer is covered by slough (yellow, tan, gray, green or brown) and/or eschar (tan, brown or black) in the wound bed. previously healed? (Active)  08/30/2018 2300  Location: Foot  Location Orientation: Left  Staging: Unstageable - Full thickness tissue loss in which the base of the ulcer is covered by slough (yellow, tan, gray, green or brown) and/or eschar (tan, brown or black) in the wound bed.  Wound Description (Comments): previously  healed?  Present on Admission: Yes    Estimated body mass index is 13.44 kg/m as calculated from the following:   Height as of this encounter: _0  (1.778 m).   Weight as of this encounter: 42.5 kg. Malnutrition Type:  Nutrition Problem: Increased nutrient needs Etiology: acute illness   Malnutrition Characteristics:  Signs/Symptoms: estimated needs   Nutrition Interventions:  Interventions: Ensure Enlive (each supplement provides 350kcal and 20 grams of protein), Hormel Shake, Magic cup, MVI    DVT prophylaxis: lovenox Family Communication:none Disposition Plan/Barrier to D/C: Will probably pass away in the hospital. Code Status:     Code Status Orders  (From admission, onward)         Start     Ordered   08/31/2018 1807  Do not attempt resuscitation (DNR)  Continuous    Question Answer Comment  In the event of cardiac or respiratory ARREST Do not call a "code blue"   In the event of cardiac or respiratory ARREST Do not perform Intubation, CPR, defibrillation or ACLS   In the event of cardiac or respiratory ARREST Use medication by any route, position, wound care, and other measures to relive pain and suffering. May use oxygen, suction and manual treatment of airway obstruction as needed for comfort.      09/07/2018 1809        Code Status History    Date Active Date Inactive Code Status Order ID Comments User Context   09/13/2018 1809 09/13/2018 1809 DNR [id number]  Karie Kirks, DO ED   03/31/2013 2059 04/11/2013 1911 DNR [id number]  Gardiner Barefoot, NP Inpatient   03/31/2013 1941 03/31/2013 2059 Full Code [id number]  Charlynne Cousins, MD Inpatient   02/14/2011 1727 03/23/2011 2106 Full Code [id number]  Babette Relic, MD ED   Advance Care Planning Activity    Advance Directive Documentation     Most Recent Value  Type of Advance Directive  Out of facility DNR (pink MOST or yellow form)  Pre-existing out of facility DNR order (yellow form or pink MOST form)   Yellow form placed in chart (order not valid for inpatient use)  "MOST" Form in Place?  -        IV Access:    Peripheral IV   Procedures and diagnostic studies:   No results found.   Medical Consultants:    None.  Anti-Infectives:   IV Unasyn  Subjective:    Wesley Sullivan unresponsive laying in bed in bed  Objective:    Vitals:   09/18/18 2047 09/19/18 0421 09/19/18 0629 09/19/18 0759  BP: 112/67  123/89 92/71  Pulse:   74 100  Resp:    20  Temp: 98.4 F (36.9 C)  (!) 97.2 F (36.2 C) 97.6 F (36.4 C)  TempSrc: Axillary  Axillary Axillary  SpO2:   100% 97%  Weight:  54.9 kg 42.5 kg   Height:       SpO2: 97 %   Intake/Output Summary (Last 24 hours) at 09/19/2018 0832 Last data filed at 09/19/2018 0628 Gross per 24 hour  Intake 1056.32 ml  Output 900 ml  Net 156.32 ml   Filed Weights   09/18/18 0500 09/19/18 0421 09/19/18 0629  Weight: 54.9 kg  54.9 kg 42.5 kg    Exam: General exam: In no acute distress. Respiratory system: Good air movement and clear to auscultation. Cardiovascular system: S1 & S2 heard, RRR. No JVD,. Gastrointestinal system: Abdomen is nondistended, soft and nontender.  Central nervous system: Unresponsive. Extremities: No pedal edema. Skin: No rashes, lesions or ulcers   Data Reviewed:    Labs: Basic Metabolic Panel: Recent Labs  Lab 09/05/2018 1530 09/15/18 0450 09/16/18 0515 09/17/18 0712 09/18/18 0538  NA 141 143 145  --  141  K 5.0 4.3 4.5  --  4.1  CL 104 108 108  --  109  CO2 _0 --  27  GLUCOSE 108* 93 73  --  102*  BUN _1 --  18  CREATININE 0.93 0.70 0.61 0.58* 0.54*  CALCIUM 9.3 8.3* 8.6*  --  8.2*   GFR Estimated Creatinine Clearance: 46.5 mL/min (A) (by C-G formula based on SCr of 0.54 mg/dL (L)). Liver Function Tests: Recent Labs  Lab 09/13/2018 1530 09/15/18 0450 09/16/18 0515  AST _2 ALT _3 ALKPHOS 117 113 133*  BILITOT 0.3 0.3 0.3  PROT 6.5 5.5* 5.2*   ALBUMIN 3.1* 2.4* 2.3*   No results for input(s): LIPASE, AMYLASE in the last 168 hours. Recent Labs  Lab 09/09/2018 1530  AMMONIA 49*   Coagulation profile Recent Labs  Lab 09/17/2018 1530 09/15/18 0450  INR 1.1 1.2   COVID-19 Labs  No results for input(s): DDIMER, FERRITIN, LDH, CRP in the last 72 hours.  Lab Results  Component Value Date   SARSCOV2NAA POSITIVE (A) 09/13/2018    CBC: Recent Labs  Lab 09/09/2018 1530 09/15/18 0450 09/16/18 0515 09/17/18 1102  WBC 26.5* 22.9* 16.1* 7.8  NEUTROABS 23.6* 21.9*  --   --   HGB 9.4* 9.4* 9.1* 9.6*  HCT 30.0* 30.1* 28.6* 30.6*  MCV 98.0 98.4 99.7 97.5  PLT 210 212 188 216   Cardiac Enzymes: No results for input(s): CKTOTAL, CKMB, CKMBINDEX, TROPONINI in the last 168 hours. BNP (last 3 results) No results for input(s): PROBNP in the last 8760 hours. CBG: No results for input(s): GLUCAP in the last 168 hours. D-Dimer: No results for input(s): DDIMER in the last 72 hours. Hgb A1c: Recent Labs    09/17/18 0717  HGBA1C 5.5   Lipid Profile: No results for input(s): CHOL, HDL, LDLCALC, TRIG, CHOLHDL, LDLDIRECT in the last 72 hours. Thyroid function studies: No results for input(s): TSH, T4TOTAL, T3FREE, THYROIDAB in the last 72 hours.  Invalid input(s): FREET3 Anemia work up: No results for input(s): VITAMINB12, FOLATE, FERRITIN, TIBC, IRON, RETICCTPCT in the last 72 hours. Sepsis Labs: Recent Labs  Lab 09/20/2018 1530 09/15/18 0450 09/15/18 0625 09/16/18 0515 09/17/18 1102 09/18/18 0538  PROCALCITON 6.76 4.18  --   --   --  1.57  WBC 26.5* 22.9*  --  16.1* 7.8  --   LATICACIDVEN 1.3  2.0*  --  1.3  --   --   --    Microbiology Recent Results (from the past 240 hour(s))  Blood Culture (routine x 2)     Status: None   Collection Time: 09/01/2018  3:30 PM   Specimen: BLOOD  Result Value Ref Range Status   Specimen Description   Final    BLOOD BLOOD LEFT HAND Performed at [facility], [place] [address], [place], [place] [address]    Special Requests   Final  BOTTLES DRAWN AEROBIC AND ANAEROBIC Blood Culture adequate volume Performed at [place] [address], [place], [place] [address]    Culture   Final    NO GROWTH 5 DAYS Performed at [facility], [place] [address], [place], [place] [address]    Report Status 09/19/2018 FINAL  Final  Blood Culture (routine x 2)     Status: None   Collection Time: 08/27/2018  3:30 PM   Specimen: BLOOD  Result Value Ref Range Status   Specimen Description   Final    BLOOD BLOOD LEFT FOREARM Performed at [place] [address], [place], [place] [address]    Special Requests   Final    BOTTLES DRAWN AEROBIC AND ANAEROBIC Blood Culture adequate volume Performed at [place] [address], [place], [place] [address]    Culture   Final    NO GROWTH 5 DAYS Performed at [facility], [place] [address] [place], [place] [address]    Report Status 09/19/2018 FINAL  Final  Urine culture     Status: None   Collection Time: 09/15/2018  3:30 PM   Specimen: In/Out Cath Urine  Result Value Ref Range Status   Specimen Description   Final    IN/OUT CATH URINE Performed at [place] [address], [place], [place] [address]    Special Requests   Final    Normal Performed at [facility], [place] [address], [place], [place] [address]    Culture   Final    NO GROWTH Performed at [facility], [place] [address], [place], [place] [address]    Report Status 09/15/2018 FINAL  Final  SARS Coronavirus 2 (CEPHEID- Performed in [place] hospital lab), Hosp Order     Status: Abnormal   Collection Time: 09/02/2018  4:40 PM   Specimen: Nasopharyngeal Swab  Result Value Ref Range Status   SARS Coronavirus 2 POSITIVE (A) NEGATIVE Final    Comment: CRITICAL RESULT CALLED TO, READ BACK BY AND VERIFIED WITH: Donley Redder [id number] @  5056 BY J SCOTTON (NOTE) If result is NEGATIVE SARS-CoV-2 target nucleic acids are NOT DETECTED. The SARS-CoV-2 RNA is generally detectable in upper and lower  respiratory specimens during the acute phase of infection. The lowest  concentration of SARS-CoV-2 viral copies this assay can detect is 250  copies / mL. A negative result does not preclude SARS-CoV-2 infection  and should not be used as the sole basis for treatment or other  patient management decisions.  A negative result may occur with  improper specimen collection / handling, submission of specimen other  than nasopharyngeal swab, presence of viral mutation(s) within the  areas targeted by this assay, and inadequate number of viral copies  (<250 copies / mL). A negative result must be combined with clinical  observations, patient history, and epidemiological information. If result is POSITIVE SARS-CoV-2 target nucleic acids  are DETECTED. The SARS-CoV-2 RNA is generally detectable in upper and lower  respiratory specimens during the acute phase of infection.  Positive  results are indicative of active infection with SARS-CoV-2.  Clinical  correlation with patient history and other diagnostic information is  necessary to determine patient infection status.  Positive results do  not rule out bacterial infection or co-infection with other viruses. If result is PRESUMPTIVE POSTIVE SARS-CoV-2 nucleic acids MAY BE PRESENT.   A presumptive positive result was obtained on the submitted specimen  and confirmed on repeat testing.  While 2019 novel coronavirus  (SARS-CoV-2) nucleic acids may be present in the submitted sample  additional confirmatory testing may be necessary for epidemiological  and / or clinical management purposes  to differentiate between  SARS-CoV-2 and other Sarbecovirus currently known to infect humans.  If clinically indicated additional testing with an alternate test  methodol ogy [phone]) is advised. The  SARS-CoV-2 RNA is generally  detectable in upper and lower respiratory specimens during the acute  phase of infection. The expected result is Negative. Fact Sheet for Patients:  StrictlyIdeas.no Fact Sheet for Healthcare Providers: BankingDealers.co.za This test is not yet approved or cleared by the [place] FDA and has been authorized for detection and/or diagnosis of SARS-CoV-2 by FDA under an Emergency Use Authorization (EUA).  This EUA will remain in effect (meaning this test can be used) for the duration of the COVID-19 declaration under Section 564(b)(1) of the Act, 21 U.S.C. section 360bbb-3(b)(1), unless the authorization is terminated or revoked sooner. Performed at [facility], [place] [address], [place], [place] [address]   MRSA PCR Screening     Status: None   Collection Time: 09/06/2018  6:18 PM   Specimen: Nasal Mucosa; Nasopharyngeal  Result Value Ref Range Status   MRSA by PCR NEGATIVE NEGATIVE Final    Comment:        The GeneXpert MRSA Assay (FDA approved for NASAL specimens only), is one component of a comprehensive MRSA colonization surveillance program. It is not intended to diagnose MRSA infection nor to guide or monitor treatment for MRSA infections. Performed at [facility], [place] [address], [place], [place] [address]   Respiratory Panel by PCR     Status: None   Collection Time: 09/16/2018  6:35 PM   Specimen: Nasal Mucosa; Respiratory  Result Value Ref Range Status   Adenovirus NOT DETECTED NOT DETECTED Final   Coronavirus 229E NOT DETECTED NOT DETECTED Final    Comment: (NOTE) The Coronavirus on the Respiratory Panel, DOES NOT test for the novel  Coronavirus (2019 nCoV)    Coronavirus HKU1 NOT DETECTED NOT DETECTED Final   Coronavirus NL63 NOT DETECTED NOT DETECTED Final   Coronavirus OC43 NOT DETECTED NOT DETECTED Final   Metapneumovirus NOT DETECTED NOT  DETECTED Final   Rhinovirus / Enterovirus NOT DETECTED NOT DETECTED Final   Influenza A NOT DETECTED NOT DETECTED Final   Influenza B NOT DETECTED NOT DETECTED Final   Parainfluenza Virus 1 NOT DETECTED NOT DETECTED Final   Parainfluenza Virus 2 NOT DETECTED NOT DETECTED Final   Parainfluenza Virus 3 NOT DETECTED NOT DETECTED Final   Parainfluenza Virus 4 NOT DETECTED NOT DETECTED Final   Respiratory Syncytial Virus NOT DETECTED NOT DETECTED Final   Bordetella pertussis NOT DETECTED NOT DETECTED Final   Chlamydophila pneumoniae NOT DETECTED NOT DETECTED Final   Mycoplasma pneumoniae NOT DETECTED NOT DETECTED Final    Comment: Performed at [facility], [place] [address], [place], [place] [address]     Medications:   . chlorhexidine  15 mL Mouth Rinse BID  . Chlorhexidine Gluconate Cloth  6 each Topical Q0600  . cholecalciferol  2,000 Units Oral Daily  . enoxaparin (LOVENOX) injection  40 mg Subcutaneous Q24H  . mouth rinse  15 mL Mouth Rinse BID  . rivastigmine  4.6 mg Transdermal Daily  . sodium chloride flush  10-40 mL Intracatheter Q12H   Continuous Infusions: . ampicillin-sulbactam (UNASYN) IV 1.5 g (09/19/18 0823)  .  levETIRAcetam Stopped (09/19/18 4403)  . phenytoin (DILANTIN) IV 200 mg (09/19/18 0539)  . valproate sodium 750 mg (09/19/18 0827)    LOS: 5 days   Charlynne Cousins  [facility]  09/19/2018, 8:32 AM

## 2018-09-19 NOTE — Progress Notes (Signed)
Nutrition Follow-up  DOCUMENTATION CODES:   Underweight  INTERVENTION:   Support goals of care  NUTRITION DIAGNOSIS:   Increased nutrient needs related to acute illness as evidenced by estimated needs. Ongoing  GOAL:   Patient will meet greater than or equal to 90% of their needs Not met.   MONITOR:   PO intake, Supplement acceptance, Labs, Weight trends, I & O's, Skin  REASON FOR ASSESSMENT:   Malnutrition Screening Tool    ASSESSMENT:   78 yr old resident of [facility] with a past medical history significant for DM II, Dementia, Cerebral Palsy, GERD, Cognitive impairment, Seizures, Speech impairment, Hypertension.  At the skilled nursing facility he was in the [facility] unit. Admitted for severe sepsis. Last positive COVID-19 test : 725  Per MD notes; pt now NPO due to possible aspiration PNA. Pt is not alert and hypothermic. POA has agreed to comfort care, family discussing transition to comfort care.    Medications and labs reviewed.   NUTRITION - FOCUSED PHYSICAL EXAM:  Unable to perform -working remotely.  Diet Order:   Diet Order            Diet NPO time specified  Diet effective now              EDUCATION NEEDS:   No education needs have been identified at this time  Skin:  Skin Assessment: Skin Integrity Issues: Skin Integrity Issues:: Unstageable Unstageable: left foot, left ankle  Last BM:  PTA  Height:   Ht Readings from Last 1 Encounters:  08/21/2018 _0  (1.778 m)    Weight:   Wt Readings from Last 1 Encounters:  09/19/18 42.5 kg    Ideal Body Weight:  75.5 kg  BMI:  Body mass index is 13.44 kg/m.  Estimated Nutritional Needs:   Kcal:  1800-2000  Protein:  80-90g  Fluid:  1.8L/day  [place], [place], [place] Pager [phone] After Hours Pager

## 2018-09-20 DIAGNOSIS — F0391 Unspecified dementia with behavioral disturbance: Secondary | ICD-10-CM

## 2018-09-20 DIAGNOSIS — G800 Spastic quadriplegic cerebral palsy: Secondary | ICD-10-CM

## 2018-09-20 DIAGNOSIS — I10 Essential (primary) hypertension: Secondary | ICD-10-CM

## 2018-09-20 DIAGNOSIS — T68XXXD Hypothermia, subsequent encounter: Secondary | ICD-10-CM

## 2018-09-20 MED ORDER — MORPHINE SULFATE (PF) 2 MG/ML IV SOLN
4.0000 mg | Freq: Once | INTRAVENOUS | Status: AC
  Administered 2018-09-20: 4 mg via INTRAVENOUS
  Filled 2018-09-20: qty 2

## 2018-09-20 MED ORDER — MORPHINE 100MG IN NS 100ML (1MG/ML) PREMIX INFUSION
1.0000 mg/h | INTRAVENOUS | Status: DC
  Administered 2018-09-20: 12:00:00 1 mg/h via INTRAVENOUS
  Filled 2018-09-20: qty 100

## 2018-09-20 NOTE — Progress Notes (Signed)
[facility] PROGRESS NOTE    Progress Note  Wesley Sullivan  SLH:[id number] DOB: 1940-12-28 DOA: 09/08/2018 PCP: Lucianne Lei, MD     Brief Narrative:   Wesley Sullivan is an 78 y.o. male past medical history significant for diabetes mellitus type 2, dementia cerebral palsy cognitive impairment seizure, speech impairment hypertension he had been complaining of headache at the skilled nursing facility and skilled nursing facility he was in the [facility] unit after that apparently his blood pressure dropped and he became more verbal.  In the emergency department he was found to be hypothermic with a CT scan of the head did not show acute findings.  Chest x-ray showed a new left lower lobe infiltrate, he was started empirically the antibiotics central line was placed transferred to the ICU.  He did not require pressors on on 09/17/2018 he stabilized and moved out of the ICU.  He has remained severely agitated and quite aggressive with staff requiring medications and restraint.  Patient speaks in a garbled speech does not appear to follow command.  Evaluated by speech recommended a dysphagia 1 diet. Assessment/Plan:   Acute respiratory failure with hypoxia due to SARS-CoV-2 versus healthcare associated pneumonia: High suspicion for aspiration he met sepsis criteria. Had a long discussion with the brother who is power of attorney and explained to him his poor prognoses and the fact that he probably has now aspiration pneumonia and hypothermia. Spoke with the brother today him and his family met and they would like to move towards comfort care, they would like all medications and lab drawn stop except medications who make him comfortable.  Possible sepsis with acute organ dysfunction: Secondary to possible aspiration pneumonia, speech evaluated the patient found him to be high risk for aspirating. Temperature has improved on a bear hugger. The patient is quite sleepy this morning.  Seizure  disorder: Continue Depakote Dilantin and Keppra.  No seizure activity noted.  Normocytic anemia: Hemoglobin seems to be stable no signs of overt bleeding.  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 Continue Exelon.  Speech impairment Cerebral palsy ([place])  Essential hypertension: Currently on no antihypertensive medication.  Pressure injury of skin RN Pressure Injury Documentation: Pressure Injury 09/16/2018 Ankle Left Unstageable - Full thickness tissue loss in which the base of the ulcer is covered by slough (yellow, tan, gray, green or brown) and/or eschar (tan, brown or black) in the wound bed. previously healed area? (Active)  09/13/2018 2300  Location: Ankle  Location Orientation: Left  Staging: Unstageable - Full thickness tissue loss in which the base of the ulcer is covered by slough (yellow, tan, gray, green or brown) and/or eschar (tan, brown or black) in the wound bed.  Wound Description (Comments): previously healed area?  Present on Admission: Yes     Pressure Injury 09/10/2018 Foot Left Unstageable - Full thickness tissue loss in which the base of the ulcer is covered by slough (yellow, tan, gray, green or brown) and/or eschar (tan, brown or black) in the wound bed. previously healed? (Active)  08/21/2018 2300  Location: Foot  Location Orientation: Left  Staging: Unstageable - Full thickness tissue loss in which the base of the ulcer is covered by slough (yellow, tan, gray, green or brown) and/or eschar (tan, brown or black) in the wound bed.  Wound Description (Comments): previously healed?  Present on Admission: Yes    Estimated body mass index is 13.44 kg/m as calculated  from the following:   Height as of this encounter: '5\' 10"'  (1.778 m).   Weight as of this encounter: 42.5 kg. Malnutrition Type:  Nutrition Problem: Increased nutrient needs Etiology: acute illness   Malnutrition  Characteristics:  Signs/Symptoms: estimated needs   Nutrition Interventions:  Interventions: Ensure Enlive (each supplement provides 350kcal and 20 grams of protein), Hormel Shake, Magic cup, MVI    DVT prophylaxis: lovenox Family Communication:none Disposition Plan/Barrier to D/C: Unable to determine Code Status:     Code Status Orders  (From admission, onward)         Start     Ordered   09/16/2018 1807  Do not attempt resuscitation (DNR)  Continuous    Question Answer Comment  In the event of cardiac or respiratory ARREST Do not call a "code blue"   In the event of cardiac or respiratory ARREST Do not perform Intubation, CPR, defibrillation or ACLS   In the event of cardiac or respiratory ARREST Use medication by any route, position, wound care, and other measures to relive pain and suffering. May use oxygen, suction and manual treatment of airway obstruction as needed for comfort.      09/09/2018 1809        Code Status History    Date Active Date Inactive Code Status Order ID Comments User Context   08/30/2018 1809 09/13/2018 1809 DNR [id number]  Karie Kirks, DO ED   03/31/2013 2059 04/11/2013 1911 DNR [id number]  Gardiner Barefoot, NP Inpatient   03/31/2013 1941 03/31/2013 2059 Full Code [id number]  Charlynne Cousins, MD Inpatient   02/14/2011 1727 03/23/2011 2106 Full Code [id number]  Babette Relic, MD ED   Advance Care Planning Activity    Advance Directive Documentation     Most Recent Value  Type of Advance Directive  Out of facility DNR (pink MOST or yellow form)  Pre-existing out of facility DNR order (yellow form or pink MOST form)  Yellow form placed in chart (order not valid for inpatient use)  "MOST" Form in Place?  -        IV Access:    Peripheral IV   Procedures and diagnostic studies:   No results found.   Medical Consultants:    None.  Anti-Infectives:   IV Unasyn  Subjective:    Wesley Sullivan  is unresponsive.  Objective:     Vitals:   09/19/18 1545 09/19/18 1940 09/20/18 0330 09/20/18 0700  BP: 108/75 127/84 121/77   Pulse: 87  91 91  Resp: 18  18   Temp: 98.2 F (36.8 C)     TempSrc: Axillary     SpO2: 99%  97% 96%  Weight:      Height:       SpO2: 96 %   Intake/Output Summary (Last 24 hours) at 09/20/2018 0818 Last data filed at 09/20/2018 0558 Gross per 24 hour  Intake 60 ml  Output 2250 ml  Net -2190 ml   Filed Weights   09/18/18 0500 09/19/18 0421 09/19/18 0629  Weight: 54.9 kg 54.9 kg 42.5 kg    Exam: General exam: No acute distress Respiratory system: Good air movement, with crackles prominently on the right. Cardiovascular system: Regular rate and rhythm positive S1-S2 no JVD. Gastrointestinal system: Positive bowel sounds soft nondistended Central nervous system: Only responsive to no size stimuli Extremities: No pedal edema. Skin: No rashes, lesions or ulcers   Data Reviewed:    Labs: Basic Metabolic Panel: Recent Labs  Lab 09/16/2018 1530  09/15/18 0450 09/16/18 0515 09/17/18 0712 09/18/18 0538  NA 141 143 145  --  141  K 5.0 4.3 4.5  --  4.1  CL 104 108 108  --  109  CO2 '26 25 28  ' --  27  GLUCOSE 108* 93 73  --  102*  BUN '23 19 20  ' --  18  CREATININE 0.93 0.70 0.61 0.58* 0.54*  CALCIUM 9.3 8.3* 8.6*  --  8.2*   GFR Estimated Creatinine Clearance: 46.5 mL/min (A) (by C-G formula based on SCr of 0.54 mg/dL (L)). Liver Function Tests: Recent Labs  Lab 09/13/2018 1530 09/15/18 0450 09/16/18 0515  AST '25 19 19  ' ALT '18 16 16  ' ALKPHOS 117 113 133*  BILITOT 0.3 0.3 0.3  PROT 6.5 5.5* 5.2*  ALBUMIN 3.1* 2.4* 2.3*   No results for input(s): LIPASE, AMYLASE in the last 168 hours. Recent Labs  Lab 09/18/2018 1530  AMMONIA 49*   Coagulation profile Recent Labs  Lab 09/04/2018 1530 09/15/18 0450  INR 1.1 1.2   COVID-19 Labs  No results for input(s): DDIMER, FERRITIN, LDH, CRP in the last 72 hours.  Lab Results  Component Value Date   SARSCOV2NAA POSITIVE (A)  09/10/2018    CBC: Recent Labs  Lab 09/08/2018 1530 09/15/18 0450 09/16/18 0515 09/17/18 1102  WBC 26.5* 22.9* 16.1* 7.8  NEUTROABS 23.6* 21.9*  --   --   HGB 9.4* 9.4* 9.1* 9.6*  HCT 30.0* 30.1* 28.6* 30.6*  MCV 98.0 98.4 99.7 97.5  PLT 210 212 188 216   Cardiac Enzymes: No results for input(s): CKTOTAL, CKMB, CKMBINDEX, TROPONINI in the last 168 hours. BNP (last 3 results) No results for input(s): PROBNP in the last 8760 hours. CBG: No results for input(s): GLUCAP in the last 168 hours. D-Dimer: No results for input(s): DDIMER in the last 72 hours. Hgb A1c: No results for input(s): HGBA1C in the last 72 hours. Lipid Profile: No results for input(s): CHOL, HDL, LDLCALC, TRIG, CHOLHDL, LDLDIRECT in the last 72 hours. Thyroid function studies: No results for input(s): TSH, T4TOTAL, T3FREE, THYROIDAB in the last 72 hours.  Invalid input(s): FREET3 Anemia work up: No results for input(s): VITAMINB12, FOLATE, FERRITIN, TIBC, IRON, RETICCTPCT in the last 72 hours. Sepsis Labs: Recent Labs  Lab 09/11/2018 1530 09/15/18 0450 09/15/18 0625 09/16/18 0515 09/17/18 1102 09/18/18 0538  PROCALCITON 6.76 4.18  --   --   --  1.57  WBC 26.5* 22.9*  --  16.1* 7.8  --   LATICACIDVEN 1.3  2.0*  --  1.3  --   --   --    Microbiology Recent Results (from the past 240 hour(s))  Blood Culture (routine x 2)     Status: None   Collection Time: 08/23/2018  3:30 PM   Specimen: BLOOD  Result Value Ref Range Status   Specimen Description   Final    BLOOD BLOOD LEFT HAND Performed at [facility], [place] [address] [place], [place] [address]    Special Requests   Final    BOTTLES DRAWN AEROBIC AND ANAEROBIC Blood Culture adequate volume Performed at [facility] [facility] [address], [place], [place] [address]    Culture   Final    NO GROWTH 5 DAYS Performed at [facility], [place] [address], [place],  [address]    Report Status 09/19/2018  FINAL  Final  Blood Culture (routine x 2)     Status: None   Collection  Time: 08/26/2018  3:30 PM   Specimen: BLOOD  Result Value Ref Range Status   Specimen Description   Final    BLOOD BLOOD LEFT FOREARM Performed at [facility] [address] [place], [place] [address]    Special Requests   Final    BOTTLES DRAWN AEROBIC AND ANAEROBIC Blood Culture adequate volume Performed at [facility] [address] [place], [place] [address]    Culture   Final    NO GROWTH 5 DAYS Performed at [facility], [place] [address], [place], [place] [address]    Report Status 09/19/2018 FINAL  Final  Urine culture     Status: None   Collection Time: 09/12/2018  3:30 PM   Specimen: In/Out Cath Urine  Result Value Ref Range Status   Specimen Description   Final    IN/OUT CATH URINE Performed at [facility] [address], [place], [place] [address]    Special Requests   Final    Normal Performed at [facility], [place] [address] [place], [place] [address]    Culture   Final    NO GROWTH Performed at [facility], [place] [address], [place], [place] [address]    Report Status 09/15/2018 FINAL  Final  SARS Coronavirus 2 (CEPHEID- Performed in [facility] hospital lab), Hosp Order     Status: Abnormal   Collection Time: 09/12/2018  4:40 PM   Specimen: Nasopharyngeal Swab  Result Value Ref Range Status   SARS Coronavirus 2 POSITIVE (A) NEGATIVE Final    Comment: CRITICAL RESULT CALLED TO, READ BACK BY AND VERIFIED WITH: Donley Redder [id number] @ 7342 BY J SCOTTON (NOTE) If result is NEGATIVE SARS-CoV-2 target nucleic acids are NOT DETECTED. The SARS-CoV-2 RNA is generally detectable in upper and lower  respiratory specimens during the acute phase of infection. The lowest  concentration of SARS-CoV-2 viral copies this assay can detect is 250  copies / mL. A negative result does not preclude SARS-CoV-2 infection   and should not be used as the sole basis for treatment or other  patient management decisions.  A negative result may occur with  improper specimen collection / handling, submission of specimen other  than nasopharyngeal swab, presence of viral mutation(s) within the  areas targeted by this assay, and inadequate number of viral copies  (<250 copies / mL). A negative result must be combined with clinical  observations, patient history, and epidemiological information. If result is POSITIVE SARS-CoV-2 target nucleic acids  are DETECTED. The SARS-CoV-2 RNA is generally detectable in upper and lower  respiratory specimens during the acute phase of infection.  Positive  results are indicative of active infection with SARS-CoV-2.  Clinical  correlation with patient history and other diagnostic information is  necessary to determine patient infection status.  Positive results do  not rule out bacterial infection or co-infection with other viruses. If result is PRESUMPTIVE POSTIVE SARS-CoV-2 nucleic acids MAY BE PRESENT.   A presumptive positive result was obtained on the submitted specimen  and confirmed on repeat testing.  While 2019 novel coronavirus  (SARS-CoV-2) nucleic acids may be present in the submitted sample  additional confirmatory testing may be necessary for epidemiological  and / or clinical management purposes  to differentiate between  SARS-CoV-2 and other Sarbecovirus currently known to infect humans.  If clinically indicated additional testing with an alternate test  methodol ogy [phone]) is advised. The SARS-CoV-2 RNA is generally  detectable in upper and lower respiratory specimens during the acute  phase of infection. The expected result is Negative. Fact Sheet for Patients:  StrictlyIdeas.no Fact Sheet for Healthcare Providers: BankingDealers.co.za This test is not yet approved or cleared by the [place] FDA and has  been authorized for detection and/or diagnosis of SARS-CoV-2 by FDA under an Emergency Use Authorization (EUA).  This EUA will remain in effect (meaning this test can be used) for the duration of the COVID-19 declaration under Section 564(b)(1) of the Act, 21 U.S.C. section 360bbb-3(b)(1), unless the authorization is terminated or revoked sooner. Performed at [facility], [place] [address], [place], [place] [address]   MRSA PCR Screening     Status: None   Collection Time: 09/02/2018  6:18 PM   Specimen: Nasal Mucosa; Nasopharyngeal  Result Value Ref Range Status   MRSA by PCR NEGATIVE NEGATIVE Final    Comment:        The GeneXpert MRSA Assay (FDA approved for NASAL specimens only), is one component of a comprehensive MRSA colonization surveillance program. It is not intended to diagnose MRSA infection nor to guide or monitor treatment for MRSA infections. Performed at [facility], [place] [address] [place], [place] [address]   Respiratory Panel by PCR     Status: None   Collection Time: 08/23/2018  6:35 PM   Specimen: Nasal Mucosa; Respiratory  Result Value Ref Range Status   Adenovirus NOT DETECTED NOT DETECTED Final   Coronavirus 229E NOT DETECTED NOT DETECTED Final    Comment: (NOTE) The Coronavirus on the Respiratory Panel, DOES NOT test for the novel  Coronavirus (2019 nCoV)    Coronavirus HKU1 NOT DETECTED NOT DETECTED Final   Coronavirus NL63 NOT DETECTED NOT DETECTED Final   Coronavirus OC43 NOT DETECTED NOT DETECTED Final   Metapneumovirus NOT DETECTED NOT DETECTED Final   Rhinovirus / Enterovirus NOT DETECTED NOT DETECTED Final   Influenza A NOT DETECTED NOT DETECTED Final   Influenza B NOT DETECTED NOT DETECTED Final   Parainfluenza Virus 1 NOT DETECTED NOT DETECTED Final   Parainfluenza Virus 2 NOT DETECTED NOT DETECTED Final   Parainfluenza Virus 3 NOT DETECTED NOT DETECTED Final   Parainfluenza Virus 4 NOT DETECTED NOT  DETECTED Final   Respiratory Syncytial Virus NOT DETECTED NOT DETECTED Final   Bordetella pertussis NOT DETECTED NOT DETECTED Final   Chlamydophila pneumoniae NOT DETECTED NOT DETECTED Final   Mycoplasma pneumoniae NOT DETECTED NOT DETECTED Final    Comment: Performed at [facility], [place] [address] [place], [place] [address]     Medications:   . chlorhexidine  15 mL Mouth Rinse BID  . Chlorhexidine Gluconate Cloth  6 each Topical Q0600  . cholecalciferol  2,000 Units Oral Daily  . enoxaparin (LOVENOX) injection  30 mg Subcutaneous Q24H  . mouth rinse  15 mL Mouth Rinse BID  . rivastigmine  4.6 mg Transdermal Daily  . sodium chloride flush  10-40 mL Intracatheter Q12H   Continuous Infusions: . ampicillin-sulbactam (UNASYN) IV 1.5 g (09/20/18 0202)  . levETIRAcetam 500 mg (09/19/18 2320)  . phenytoin (DILANTIN) IV 200 mg (09/20/18 0355)  . valproate sodium 750 mg (09/19/18 2114)    LOS: 6 days   Charlynne Cousins  [facility]  09/20/2018, 8:18 AM

## 2018-09-21 DEATH — deceased

## 2018-10-22 NOTE — Death Summary Note (Signed)
Death Summary  Wesley HeightRobert L Sullivan QMV:[id number] DOB: 12/19/1940 DOA: 09/12/2018  PCP: Wesley Sullivan, Veita, Wesley Sullivan  Admit date: 09/18/2018 Date of Death: 09/29/2018 Time of Death: 02:10 Notification: Wesley Sullivan, Veita, Wesley Sullivan notified of death of 10/20/2018   History of present illness:  Wesley Sullivan is a 78 y.o. male with a history of cerebral palsy, dementia, diabetes mellitus, seizure disorder, and hypertension, admitted to the hospital from his SNF on 08/21/2018 with hypotension and hypothermia.  He was found to have leukocytosis to 25,000, chest x-ray concerning for left-sided pneumonia, significantly elevated procalcitonin, and COVID-19 testing was positive.  Blood pressure stabilized without vasopressors and the patient was treated with steroids and broad-spectrum IV antibiotics.  There was concern that the patient had been aspirating, he was evaluated by speech therapy, and a dysphasia diet was recommended.  Unfortunately, patient failed to show any significant improvement and on 09/20/2018, his family asked that we focus only on keeping the patient comfortable.  Patient expired at 02:10 a.m. on 09/30/2018.   Final Diagnoses:  1.   Sepsis secondary to pneumonia  2.   COVID-19 infection    The results of significant diagnostics from this hospitalization (including imaging, microbiology, ancillary and laboratory) are listed below for reference.    Significant Diagnostic Studies: Ct Head Wo Contrast  Result Date: 08/28/2018 CLINICAL DATA:  78 year old male with altered mental status. EXAM: CT HEAD WITHOUT CONTRAST TECHNIQUE: Contiguous axial images were obtained from the base of the skull through the vertex without intravenous contrast. COMPARISON:  06/22/2017 and prior CTs FINDINGS: Brain: No evidence of acute infarction, hemorrhage, hydrocephalus, extra-axial collection or mass lesion/mass effect. Atrophy, chronic small-vessel white matter ischemic changes and remote RIGHT frontal and LEFT occipital  infarcts noted. Vascular: Carotid atherosclerotic calcifications again noted. Skull: Normal. Negative for fracture or focal lesion. Sinuses/Orbits: No acute abnormality Other: None IMPRESSION: 1. No evidence of acute intracranial abnormality 2. Atrophy, chronic small-vessel white matter ischemic changes and remote RIGHT frontal and LEFT occipital infarcts. Electronically Signed   By: Harmon PierJeffrey  Hu M.D.   On: 08/23/2018 16:51   Dg Chest Port 1 View  Result Date: 09/20/2018 CLINICAL DATA:  [facility], pt was in Covid Unit at facility. Unsure when of if pt was actually tested. There is no documentation regarding testing. EXAM: PORTABLE CHEST 1 VIEW COMPARISON:  04/03/2013 FINDINGS: Heart size is normal. There is mild atherosclerotic calcification of the thoracic aorta. There is faint patchy density at the LEFT lung base, consistent with early infiltrate. The RIGHT lung is clear. No pulmonary edema. IMPRESSION: Early LEFT lower lobe infiltrate. Electronically Signed   By: Norva PavlovElizabeth  Brown M.D.   On: 09/18/2018 16:46    Microbiology: Recent Results (from the past 240 hour(s))  Blood Culture (routine x 2)     Status: None   Collection Time: 09/18/2018  3:30 PM   Specimen: BLOOD  Result Value Ref Range Status   Specimen Description   Final    BLOOD BLOOD LEFT HAND Performed at [facility], [address] [place], [place] [address]    Special Requests   Final    BOTTLES DRAWN AEROBIC AND ANAEROBIC Blood Culture adequate volume Performed at [facility], [address] [place], [place] [address]    Culture   Final    NO GROWTH 5 DAYS Performed at [facility], [address] [place], [place] [address]    Report Status 09/19/2018 FINAL  Final  Blood Culture (routine x 2)     Status: None   Collection Time: 09/09/2018  3:30 PM   Specimen: BLOOD  Result Value Ref Range Status   Specimen Description   Final    BLOOD  BLOOD LEFT FOREARM Performed at [facility], [address] [place], [place] [address]    Special Requests   Final    BOTTLES DRAWN AEROBIC AND ANAEROBIC Blood Culture adequate volume Performed at [facility], [address] [place], [place] [address]    Culture   Final    NO GROWTH 5 DAYS Performed at [facility], [address] [place], [place] [address]    Report Status 09/19/2018 FINAL  Final  Urine culture     Status: None   Collection Time: 09/11/2018  3:30 PM   Specimen: In/Out Cath Urine  Result Value Ref Range Status   Specimen Description   Final    IN/OUT CATH URINE Performed at [facility], [address] [place], [place] [address]    Special Requests   Final    Normal Performed at [facility], [address] [place], [place] [address]    Culture   Final    NO GROWTH Performed at [facility], [address] [place], [place] [address]    Report Status 09/15/2018 FINAL  Final  SARS Coronavirus 2 (CEPHEID- Performed in [facility] hospital lab), Hosp Order     Status: Abnormal   Collection Time: 09/09/2018  4:40 PM   Specimen: Nasopharyngeal Swab  Result Value Ref Range Status   SARS Coronavirus 2 POSITIVE (A) NEGATIVE Final    Comment: CRITICAL RESULT CALLED TO, READ BACK BY AND VERIFIED WITH: Antony OdeaOYCE BARHAM,RN [id number] @ 1804 BY J SCOTTON (NOTE) If result is NEGATIVE SARS-CoV-2 target nucleic acids are NOT DETECTED. The SARS-CoV-2 RNA is generally detectable in upper and lower  respiratory specimens during the acute phase of infection. The lowest  concentration of SARS-CoV-2 viral copies this assay can detect is 250  copies / mL. A negative result does not preclude SARS-CoV-2 infection  and should not be used as the sole basis for treatment or other  patient management decisions.  A negative result may occur with  improper specimen collection / handling, submission of  specimen other  than nasopharyngeal swab, presence of viral mutation(s) within the  areas targeted by this assay, and inadequate number of viral copies  (<250 copies / mL). A negative result must be combined with clinical  observations, patient history, and epidemiological information. If result is POSITIVE SARS-CoV-2 target nucleic acids  are DETECTED. The SARS-CoV-2 RNA is generally detectable in upper and lower  respiratory specimens during the acute phase of infection.  Positive  results are indicative of active infection with SARS-CoV-2.  Clinical  correlation with patient history and other diagnostic information is  necessary to determine patient infection status.  Positive results do  not rule out bacterial infection or co-infection with other viruses. If result is PRESUMPTIVE POSTIVE SARS-CoV-2 nucleic acids MAY BE PRESENT.   A presumptive positive result was obtained on the submitted specimen  and confirmed on repeat testing.  While 2019 novel coronavirus  (SARS-CoV-2) nucleic acids may be present in the submitted sample  additional confirmatory testing may be necessary for epidemiological  and / or clinical management purposes  to differentiate between  SARS-CoV-2 and other Sarbecovirus currently known to infect humans.  If clinically indicated additional testing  with an alternate test  methodol ogy [phone]) is advised. The SARS-CoV-2 RNA is generally  detectable in upper and lower respiratory specimens during the acute  phase of infection. The expected result is Negative. Fact Sheet for Patients:  StrictlyIdeas.no Fact Sheet for Healthcare Providers: BankingDealers.co.za This test is not yet approved or cleared by the [place] FDA and has been authorized for detection and/or diagnosis of SARS-CoV-2 by FDA under an Emergency Use Authorization (EUA).  This EUA will remain in effect (meaning this test can be used) for the  duration of the COVID-19 declaration under Section 564(b)(1) of the Act, 21 U.S.C. section 360bbb-3(b)(1), unless the authorization is terminated or revoked sooner. Performed at [facility], [place] [address], [place], [place] [address]   MRSA PCR Screening     Status: None   Collection Time: 09/06/2018  6:18 PM   Specimen: Nasal Mucosa; Nasopharyngeal  Result Value Ref Range Status   MRSA by PCR NEGATIVE NEGATIVE Final    Comment:        The GeneXpert MRSA Assay (FDA approved for NASAL specimens only), is one component of a comprehensive MRSA colonization surveillance program. It is not intended to diagnose MRSA infection nor to guide or monitor treatment for MRSA infections. Performed at [facility], [place] [address] [place], [place] [address]   Respiratory Panel by PCR     Status: None   Collection Time: 09/08/2018  6:35 PM   Specimen: Nasal Mucosa; Respiratory  Result Value Ref Range Status   Adenovirus NOT DETECTED NOT DETECTED Final   Coronavirus 229E NOT DETECTED NOT DETECTED Final    Comment: (NOTE) The Coronavirus on the Respiratory Panel, DOES NOT test for the novel  Coronavirus (2019 nCoV)    Coronavirus HKU1 NOT DETECTED NOT DETECTED Final   Coronavirus NL63 NOT DETECTED NOT DETECTED Final   Coronavirus OC43 NOT DETECTED NOT DETECTED Final   Metapneumovirus NOT DETECTED NOT DETECTED Final   Rhinovirus / Enterovirus NOT DETECTED NOT DETECTED Final   Influenza A NOT DETECTED NOT DETECTED Final   Influenza B NOT DETECTED NOT DETECTED Final   Parainfluenza Virus 1 NOT DETECTED NOT DETECTED Final   Parainfluenza Virus 2 NOT DETECTED NOT DETECTED Final   Parainfluenza Virus 3 NOT DETECTED NOT DETECTED Final   Parainfluenza Virus 4 NOT DETECTED NOT DETECTED Final   Respiratory Syncytial Virus NOT DETECTED NOT DETECTED Final   Bordetella pertussis NOT DETECTED NOT DETECTED Final   Chlamydophila pneumoniae NOT DETECTED NOT DETECTED  Final   Mycoplasma pneumoniae NOT DETECTED NOT DETECTED Final    Comment: Performed at [facility], [place] [address] [place], [place] [address]     Labs: Basic Metabolic Panel: Recent Labs  Lab 08/31/2018 1530 09/15/18 0450 09/16/18 0515 09/17/18 0712 09/18/18 0538  NA 141 143 145  --  141  K 5.0 4.3 4.5  --  4.1  CL 104 108 108  --  109  CO2 26 25 28   --  27  GLUCOSE 108* 93 73  --  102*  BUN 23 19 20   --  18  CREATININE 0.93 0.70 0.61 0.58* 0.54*  CALCIUM 9.3 8.3* 8.6*  --  8.2*   Liver Function Tests: Recent Labs  Lab 09/04/2018 1530 09/15/18 0450 09/16/18 0515  AST 25 19 19   ALT 18 16 16   ALKPHOS 117 113 133*  BILITOT 0.3 0.3 0.3  PROT 6.5 5.5* 5.2*  ALBUMIN 3.1* 2.4* 2.3*   No results  for input(s): LIPASE, AMYLASE in the last 168 hours. Recent Labs  Lab 09/10/2018 1530  AMMONIA 49*   CBC: Recent Labs  Lab 08/29/2018 1530 09/15/18 0450 09/16/18 0515 09/17/18 1102  WBC 26.5* 22.9* 16.1* 7.8  NEUTROABS 23.6* 21.9*  --   --   HGB 9.4* 9.4* 9.1* 9.6*  HCT 30.0* 30.1* 28.6* 30.6*  MCV 98.0 98.4 99.7 97.5  PLT 210 212 188 216   Cardiac Enzymes: No results for input(s): CKTOTAL, CKMB, CKMBINDEX, TROPONINI in the last 168 hours. D-Dimer No results for input(s): DDIMER in the last 72 hours. BNP: Invalid input(s): POCBNP CBG: No results for input(s): GLUCAP in the last 168 hours. Anemia work up No results for input(s): VITAMINB12, FOLATE, FERRITIN, TIBC, IRON, RETICCTPCT in the last 72 hours. Urinalysis    Component Value Date/Time   COLORURINE YELLOW 09/13/2018 1530   APPEARANCEUR CLEAR 09/20/2018 1530   LABSPEC 1.018 09/20/2018 1530   PHURINE 6.0 09/04/2018 1530   GLUCOSEU NEGATIVE 09/12/2018 1530   HGBUR NEGATIVE 09/13/2018 1530   BILIRUBINUR NEGATIVE 09/02/2018 1530   KETONESUR NEGATIVE 08/30/2018 1530   PROTEINUR NEGATIVE 09/12/2018 1530   UROBILINOGEN 1.0 03/31/2013 1511   NITRITE NEGATIVE 09/09/2018 1530   LEUKOCYTESUR NEGATIVE 08/26/2018  1530   Sepsis Labs Invalid input(s): PROCALCITONIN,  WBC,  LACTICIDVEN  On behalf of all of us who had the opportunity to participate in the care of this fine gentleman, or thoughts and prayers are with his family and friends as they mourn his passing.   SIGNED:  Briscoe Deutscherimothy S , Wesley Sullivan  [facility] 09/26/2018, 2:56 AM Pager   If 7PM-7AM, please contact night-coverage [email or web address] Password [email or web address]

## 2018-10-22 NOTE — Progress Notes (Signed)
After patient passed, 80mg  IV Morphine still left in bag. 80 mg IV Morphine was wasted down the sink. Gordy Clement, charge RN, witnessed the waste.

## 2018-10-22 DEATH — deceased
